# Patient Record
Sex: Male | Born: 1970 | Race: White | Hispanic: No | Marital: Married
Health system: Southern US, Community
[De-identification: ages and names within clinical notes are randomized; demographics above are authoritative.]

---

## 2020-01-12 ENCOUNTER — Other Ambulatory Visit: Payer: Self-pay | Admitting: Nurse Practitioner

## 2020-01-12 DIAGNOSIS — M7989 Other specified soft tissue disorders: Secondary | ICD-10-CM

## 2020-01-18 ENCOUNTER — Ambulatory Visit
Admission: RE | Admit: 2020-01-18 | Discharge: 2020-01-18 | Disposition: A | Discharge status: Home / Self Care | Payer: BC Managed Care – PPO | Source: Ambulatory Visit | Attending: Nurse Practitioner | Admitting: Nurse Practitioner

## 2020-01-18 ENCOUNTER — Other Ambulatory Visit: Payer: Self-pay

## 2020-01-18 DIAGNOSIS — M7989 Other specified soft tissue disorders: Secondary | ICD-10-CM | POA: Diagnosis not present

## 2020-11-30 ENCOUNTER — Other Ambulatory Visit: Payer: Self-pay | Admitting: Student

## 2020-11-30 DIAGNOSIS — G629 Polyneuropathy, unspecified: Secondary | ICD-10-CM

## 2020-11-30 DIAGNOSIS — R200 Anesthesia of skin: Secondary | ICD-10-CM

## 2020-12-09 ENCOUNTER — Other Ambulatory Visit: Payer: Self-pay

## 2020-12-09 ENCOUNTER — Ambulatory Visit
Admission: RE | Admit: 2020-12-09 | Discharge: 2020-12-09 | Disposition: A | Discharge status: Home / Self Care | Payer: BC Managed Care – PPO | Source: Ambulatory Visit | Attending: Student | Admitting: Student

## 2020-12-09 DIAGNOSIS — R200 Anesthesia of skin: Secondary | ICD-10-CM | POA: Insufficient documentation

## 2020-12-09 DIAGNOSIS — G629 Polyneuropathy, unspecified: Secondary | ICD-10-CM | POA: Insufficient documentation

## 2020-12-09 IMAGING — MR MR LUMBAR SPINE W/O CM
5 series · 31 of 48 positions shown · non-contrast
Comparison: None.

CLINICAL DATA: Chronic low back and bilateral leg pain for 2-3
years.

EXAM:
MRI LUMBAR SPINE WITHOUT CONTRAST
TECHNIQUE: Multiplanar, multisequence MR imaging of the lumbar spine was
performed. No intravenous contrast was administered.

[Series 5: T2 · sagittal · 4.0mm · 0.88mm/px · 6 of 17 slices shown (1 of 2)]
[im 1/17]
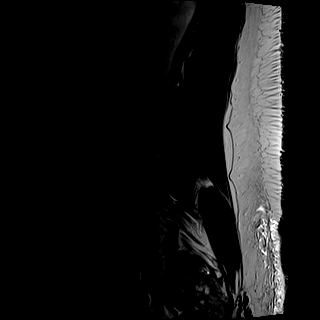
[im 4/17]
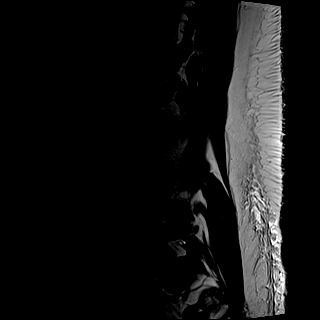
[im 7/17]
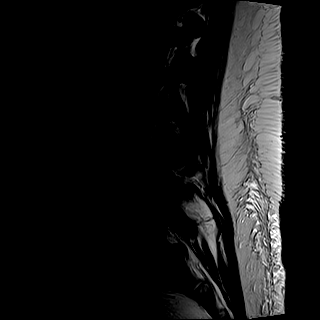
[im 10/17]
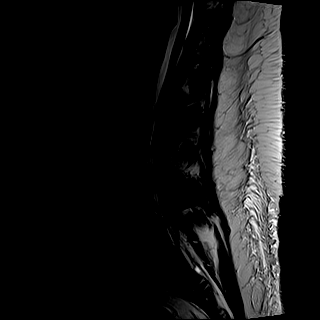
[im 13/17]
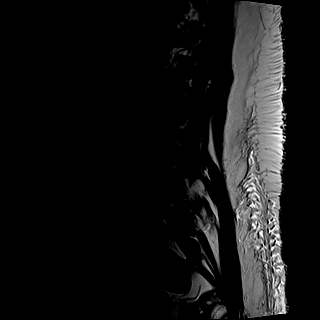
[im 17/17]
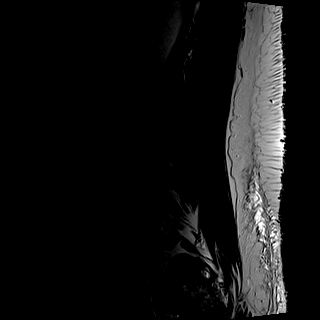

[Series 6: T1 · sagittal · 4.0mm · 0.88mm/px · 6 of 17 slices shown (1 of 2)]
[im 1/17]
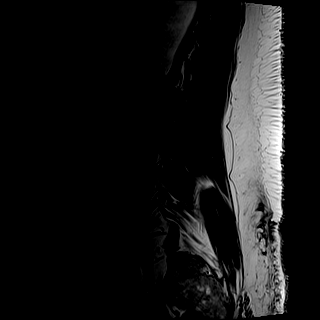
[im 4/17]
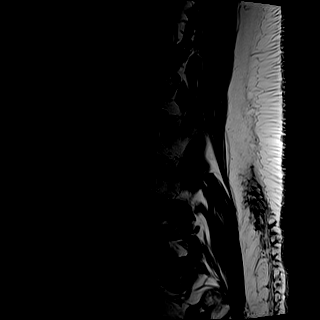
[im 7/17]
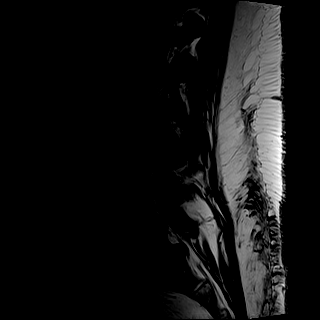
[im 10/17]
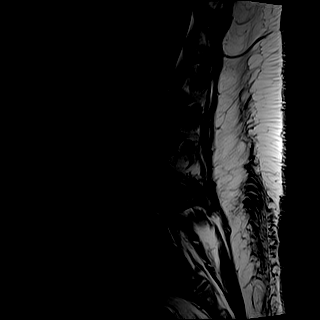
[im 13/17]
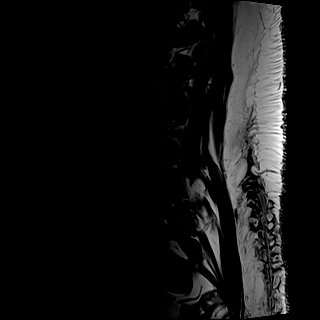
[im 17/17]
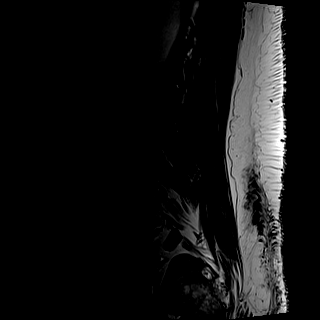

[Series 7: STIR · sagittal · 4.0mm · 0.44mm/px · 1 of 17 slices shown]
[im 1/17]
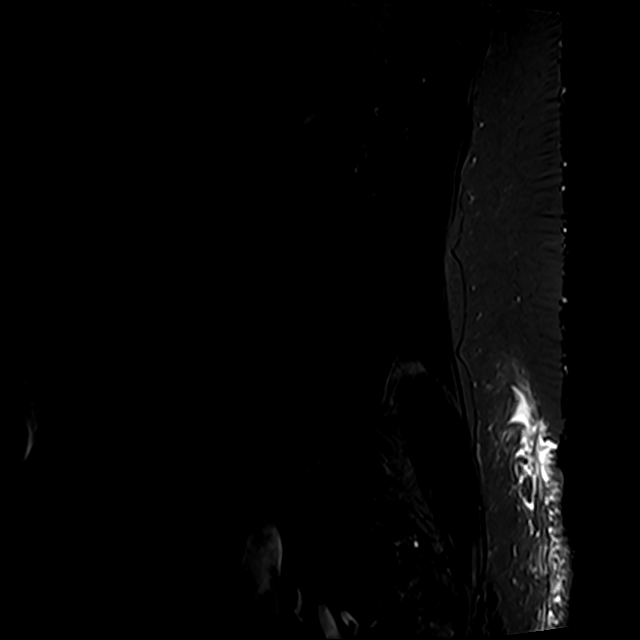

[Series 8: T2 · axial · 4.0mm · 0.78mm/px · z∈[-189,+81]mm · 9 of 42 slices shown (2 of 2)]
[im 1/42]
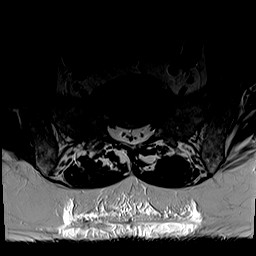
[im 6/42]
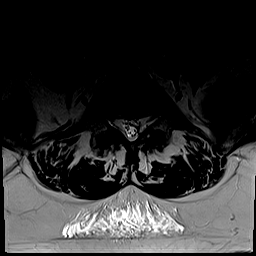
[im 12/42]
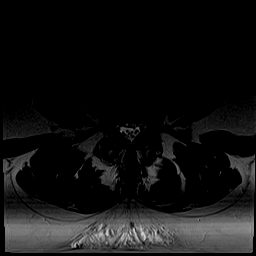
[im 18/42]
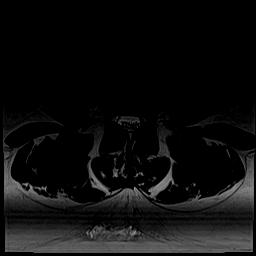
[im 21/42]
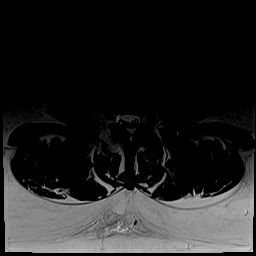
[im 24/42]
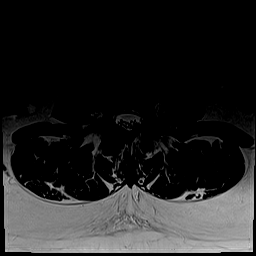
[im 30/42]
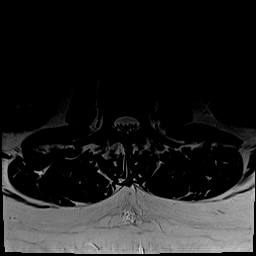
[im 36/42]
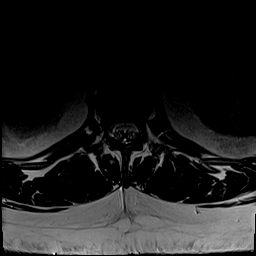
[im 42/42]
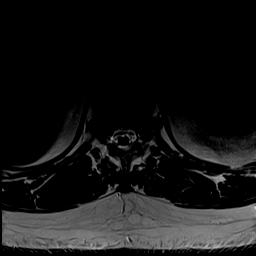

[Series 9: T1 · axial · 4.0mm · 0.39mm/px · z∈[-189,+81]mm · 9 of 42 slices shown (2 of 2)]
[im 1/42]
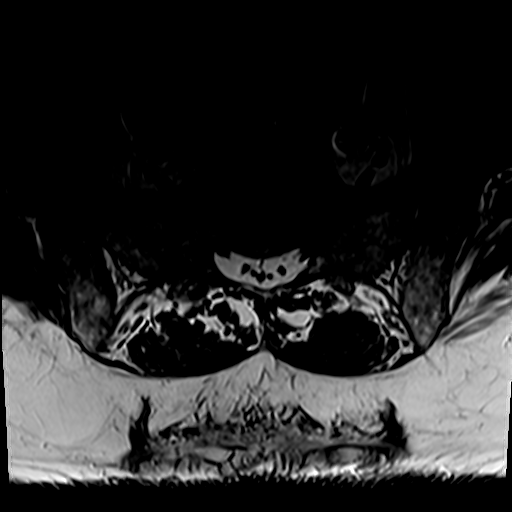
[im 6/42]
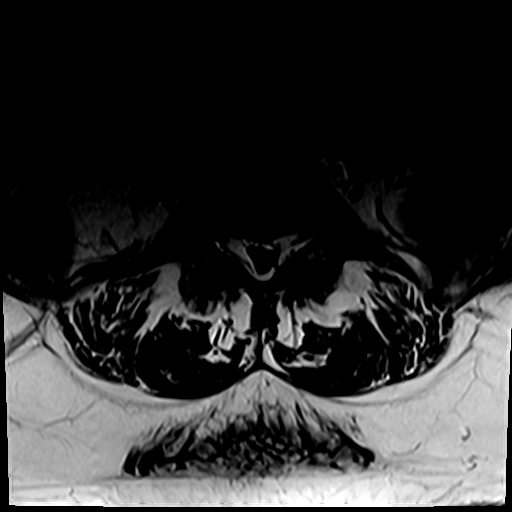
[im 12/42]
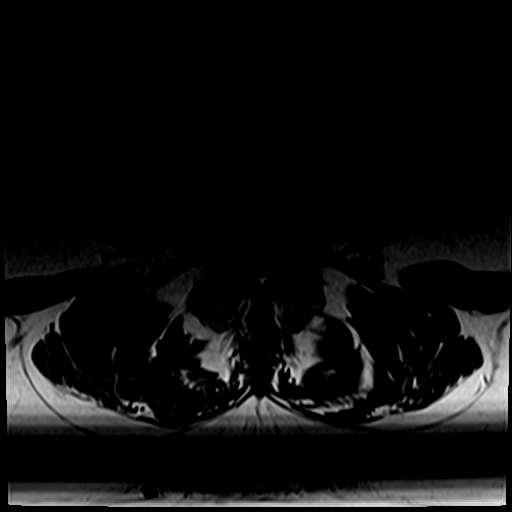
[im 18/42]
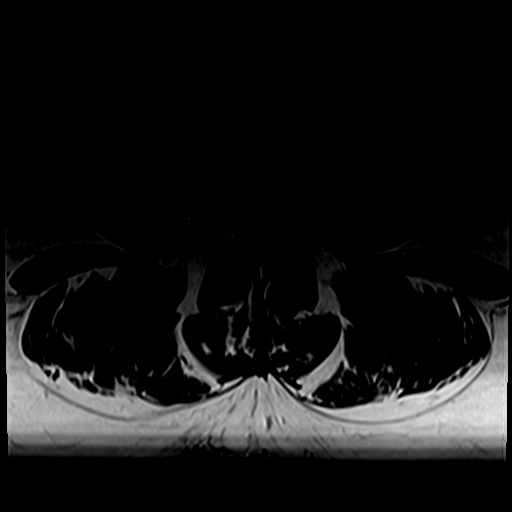
[im 21/42]
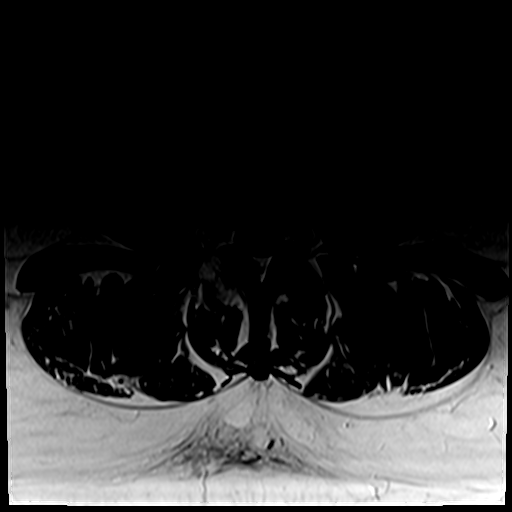
[im 24/42]
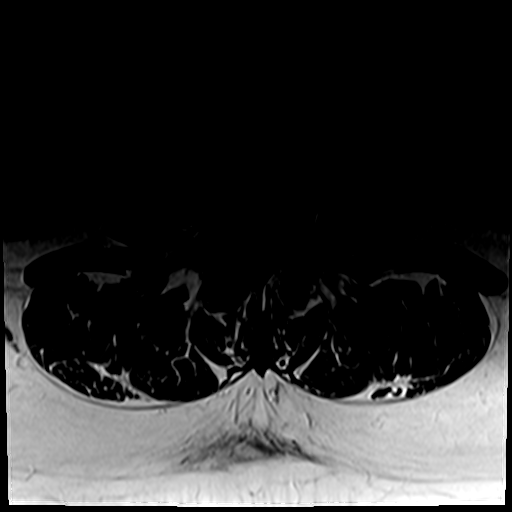
[im 30/42]
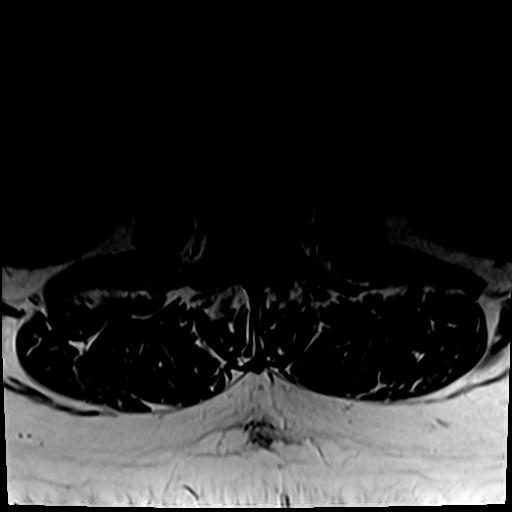
[im 36/42]
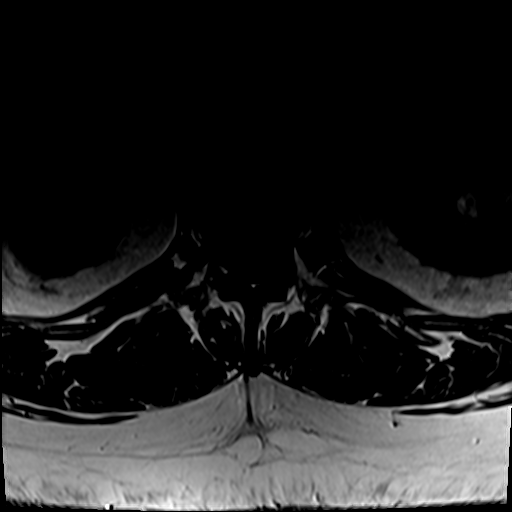
[im 42/42]
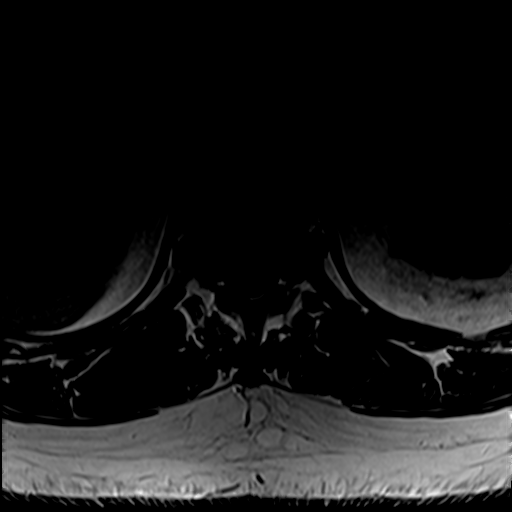

[31 of 48 positions shown; findings below may reference images not displayed]

FINDINGS: Segmentation: There are five lumbar type vertebral bodies. The last
full intervertebral disc space is labeled L5-S1.

Alignment: Overall normal alignment. There is mild degenerative
anterolisthesis of L4.

Vertebrae: Marrow demonstrates slightly heterogeneous low T1 signal
intensity. I do not see any abnormal T2 or STIR signal intensity.
Findings could be due to smoking, anemia or obesity. No focal
lesions or fractures.

Conus medullaris and cauda equina: Conus extends to the T12-L1
level. Conus and cauda equina appear normal.

Paraspinal and other soft tissues: No significant paraspinal or
retroperitoneal findings. Simple appearing renal cysts.

Disc levels:

T12-L1: No significant findings.

L1-2: Mild annular bulge and mild facet disease but no disc
protrusions, spinal or foraminal stenosis.

L2-3: Moderate facet disease but no spinal or foraminal stenosis.

L3-4: Diffuse bulging degenerated and uncovered annulus, short
pedicles, advanced facet disease and mild ligamentum flavum
thickening all contributing to moderately severe spinal and
bilateral lateral recess stenosis. No foraminal stenosis.

L4-5: Diffuse bulging uncovered disc, short pedicles, severe facet
disease and mild ligamentum flavum thickening contributing to
moderately severe spinal and bilateral lateral recess stenosis. No
significant foraminal stenosis.

L5-S1: Left paracentral disc osteophyte complex with mass effect on
the left side of the thecal sac and on the left S1 nerve root. The
exiting L5 nerve roots are unremarkable.
IMPRESSION: 1. Moderately severe multifactorial spinal and bilateral lateral
recess stenosis at L3-4 and L4-5.
2. Left paracentral disc osteophyte complex at L5-S1 with mass
effect on the left side of the thecal sac and on the left S1 nerve
root.

## 2021-06-20 ENCOUNTER — Encounter: Payer: Self-pay | Admitting: Emergency Medicine

## 2021-06-20 ENCOUNTER — Inpatient Hospital Stay
Admission: EM | Admit: 2021-06-20 | Discharge: 2021-08-05 | DRG: 640 | Disposition: E | Payer: BC Managed Care – PPO | Attending: Pulmonary Disease | Admitting: Pulmonary Disease

## 2021-06-20 ENCOUNTER — Other Ambulatory Visit: Payer: Self-pay

## 2021-06-20 ENCOUNTER — Emergency Department: Payer: BC Managed Care – PPO

## 2021-06-20 DIAGNOSIS — D6959 Other secondary thrombocytopenia: Secondary | ICD-10-CM | POA: Diagnosis present

## 2021-06-20 DIAGNOSIS — I2699 Other pulmonary embolism without acute cor pulmonale: Secondary | ICD-10-CM | POA: Diagnosis not present

## 2021-06-20 DIAGNOSIS — Z882 Allergy status to sulfonamides status: Secondary | ICD-10-CM

## 2021-06-20 DIAGNOSIS — J9601 Acute respiratory failure with hypoxia: Secondary | ICD-10-CM | POA: Diagnosis not present

## 2021-06-20 DIAGNOSIS — R7989 Other specified abnormal findings of blood chemistry: Secondary | ICD-10-CM

## 2021-06-20 DIAGNOSIS — J9602 Acute respiratory failure with hypercapnia: Secondary | ICD-10-CM | POA: Diagnosis not present

## 2021-06-20 DIAGNOSIS — N179 Acute kidney failure, unspecified: Secondary | ICD-10-CM | POA: Diagnosis present

## 2021-06-20 DIAGNOSIS — R531 Weakness: Principal | ICD-10-CM

## 2021-06-20 DIAGNOSIS — R34 Anuria and oliguria: Secondary | ICD-10-CM | POA: Diagnosis not present

## 2021-06-20 DIAGNOSIS — Z6841 Body Mass Index (BMI) 40.0 and over, adult: Secondary | ICD-10-CM

## 2021-06-20 DIAGNOSIS — A419 Sepsis, unspecified organism: Secondary | ICD-10-CM

## 2021-06-20 DIAGNOSIS — G7281 Critical illness myopathy: Secondary | ICD-10-CM | POA: Diagnosis not present

## 2021-06-20 DIAGNOSIS — G6281 Critical illness polyneuropathy: Secondary | ICD-10-CM | POA: Diagnosis not present

## 2021-06-20 DIAGNOSIS — R509 Fever, unspecified: Secondary | ICD-10-CM | POA: Diagnosis not present

## 2021-06-20 DIAGNOSIS — K760 Fatty (change of) liver, not elsewhere classified: Secondary | ICD-10-CM | POA: Diagnosis present

## 2021-06-20 DIAGNOSIS — Z20822 Contact with and (suspected) exposure to covid-19: Secondary | ICD-10-CM | POA: Diagnosis present

## 2021-06-20 DIAGNOSIS — Z66 Do not resuscitate: Secondary | ICD-10-CM | POA: Diagnosis not present

## 2021-06-20 DIAGNOSIS — F10939 Alcohol use, unspecified with withdrawal, unspecified: Secondary | ICD-10-CM | POA: Diagnosis present

## 2021-06-20 DIAGNOSIS — M6284 Sarcopenia: Secondary | ICD-10-CM | POA: Diagnosis not present

## 2021-06-20 DIAGNOSIS — G9341 Metabolic encephalopathy: Secondary | ICD-10-CM | POA: Diagnosis present

## 2021-06-20 DIAGNOSIS — F10931 Alcohol use, unspecified with withdrawal delirium: Secondary | ICD-10-CM | POA: Diagnosis not present

## 2021-06-20 DIAGNOSIS — F10231 Alcohol dependence with withdrawal delirium: Secondary | ICD-10-CM | POA: Diagnosis present

## 2021-06-20 DIAGNOSIS — R6521 Severe sepsis with septic shock: Secondary | ICD-10-CM | POA: Diagnosis not present

## 2021-06-20 DIAGNOSIS — N170 Acute kidney failure with tubular necrosis: Secondary | ICD-10-CM | POA: Diagnosis present

## 2021-06-20 DIAGNOSIS — Y900 Blood alcohol level of less than 20 mg/100 ml: Secondary | ICD-10-CM | POA: Diagnosis present

## 2021-06-20 DIAGNOSIS — J690 Pneumonitis due to inhalation of food and vomit: Secondary | ICD-10-CM | POA: Diagnosis not present

## 2021-06-20 DIAGNOSIS — I248 Other forms of acute ischemic heart disease: Secondary | ICD-10-CM | POA: Diagnosis not present

## 2021-06-20 DIAGNOSIS — G8929 Other chronic pain: Secondary | ICD-10-CM | POA: Diagnosis present

## 2021-06-20 DIAGNOSIS — N186 End stage renal disease: Secondary | ICD-10-CM

## 2021-06-20 DIAGNOSIS — D696 Thrombocytopenia, unspecified: Secondary | ICD-10-CM

## 2021-06-20 DIAGNOSIS — J320 Chronic maxillary sinusitis: Secondary | ICD-10-CM | POA: Diagnosis present

## 2021-06-20 DIAGNOSIS — E46 Unspecified protein-calorie malnutrition: Secondary | ICD-10-CM | POA: Diagnosis not present

## 2021-06-20 DIAGNOSIS — G629 Polyneuropathy, unspecified: Secondary | ICD-10-CM | POA: Diagnosis present

## 2021-06-20 DIAGNOSIS — E871 Hypo-osmolality and hyponatremia: Principal | ICD-10-CM | POA: Diagnosis present

## 2021-06-20 DIAGNOSIS — R7401 Elevation of levels of liver transaminase levels: Secondary | ICD-10-CM | POA: Diagnosis present

## 2021-06-20 DIAGNOSIS — E860 Dehydration: Secondary | ICD-10-CM | POA: Diagnosis present

## 2021-06-20 DIAGNOSIS — Z978 Presence of other specified devices: Secondary | ICD-10-CM

## 2021-06-20 DIAGNOSIS — R042 Hemoptysis: Secondary | ICD-10-CM | POA: Diagnosis not present

## 2021-06-20 DIAGNOSIS — Z7189 Other specified counseling: Secondary | ICD-10-CM | POA: Diagnosis not present

## 2021-06-20 DIAGNOSIS — K709 Alcoholic liver disease, unspecified: Secondary | ICD-10-CM | POA: Diagnosis present

## 2021-06-20 DIAGNOSIS — Z515 Encounter for palliative care: Secondary | ICD-10-CM | POA: Diagnosis not present

## 2021-06-20 DIAGNOSIS — M549 Dorsalgia, unspecified: Secondary | ICD-10-CM | POA: Diagnosis present

## 2021-06-20 DIAGNOSIS — T508X5A Adverse effect of diagnostic agents, initial encounter: Secondary | ICD-10-CM | POA: Diagnosis present

## 2021-06-20 DIAGNOSIS — K7200 Acute and subacute hepatic failure without coma: Secondary | ICD-10-CM | POA: Diagnosis not present

## 2021-06-20 LAB — URINALYSIS, ROUTINE W REFLEX MICROSCOPIC
Glucose, UA: NEGATIVE mg/dL
Ketones, ur: 5 mg/dL — AB
Leukocytes,Ua: NEGATIVE
Nitrite: NEGATIVE
Protein, ur: 30 mg/dL — AB
Specific Gravity, Urine: 1.043 — ABNORMAL HIGH (ref 1.005–1.030)
pH: 5 (ref 5.0–8.0)

## 2021-06-20 LAB — CBC WITH DIFFERENTIAL/PLATELET
Abs Immature Granulocytes: 0.17 10*3/uL — ABNORMAL HIGH (ref 0.00–0.07)
Basophils Absolute: 0.1 10*3/uL (ref 0.0–0.1)
Basophils Relative: 1 %
Eosinophils Absolute: 0 10*3/uL (ref 0.0–0.5)
Eosinophils Relative: 0 %
HCT: 41.7 % (ref 39.0–52.0)
Hemoglobin: 15.3 g/dL (ref 13.0–17.0)
Immature Granulocytes: 2 %
Lymphocytes Relative: 8 %
Lymphs Abs: 0.8 10*3/uL (ref 0.7–4.0)
MCH: 37.5 pg — ABNORMAL HIGH (ref 26.0–34.0)
MCHC: 36.7 g/dL — ABNORMAL HIGH (ref 30.0–36.0)
MCV: 102.2 fL — ABNORMAL HIGH (ref 80.0–100.0)
Monocytes Absolute: 1.8 10*3/uL — ABNORMAL HIGH (ref 0.1–1.0)
Monocytes Relative: 16 %
Neutro Abs: 8.1 10*3/uL — ABNORMAL HIGH (ref 1.7–7.7)
Neutrophils Relative %: 73 %
Platelets: 129 10*3/uL — ABNORMAL LOW (ref 150–400)
RBC: 4.08 MIL/uL — ABNORMAL LOW (ref 4.22–5.81)
RDW: 13.2 % (ref 11.5–15.5)
WBC: 11 10*3/uL — ABNORMAL HIGH (ref 4.0–10.5)
nRBC: 0.7 % — ABNORMAL HIGH (ref 0.0–0.2)

## 2021-06-20 LAB — COMPREHENSIVE METABOLIC PANEL
ALT: 119 U/L — ABNORMAL HIGH (ref 0–44)
AST: 256 U/L — ABNORMAL HIGH (ref 15–41)
Albumin: 3.1 g/dL — ABNORMAL LOW (ref 3.5–5.0)
Alkaline Phosphatase: 116 U/L (ref 38–126)
Anion gap: 17 — ABNORMAL HIGH (ref 5–15)
BUN: 8 mg/dL (ref 6–20)
CO2: 23 mmol/L (ref 22–32)
Calcium: 8.5 mg/dL — ABNORMAL LOW (ref 8.9–10.3)
Chloride: 69 mmol/L — ABNORMAL LOW (ref 98–111)
Creatinine, Ser: 1.62 mg/dL — ABNORMAL HIGH (ref 0.61–1.24)
GFR, Estimated: 52 mL/min — ABNORMAL LOW (ref >60)
Glucose, Bld: 108 mg/dL — ABNORMAL HIGH (ref 70–99)
Potassium: 4.5 mmol/L (ref 3.5–5.1)
Sodium: 109 mmol/L — CL (ref 135–145)
Total Bilirubin: 5 mg/dL — ABNORMAL HIGH (ref 0.3–1.2)
Total Protein: 6.8 g/dL (ref 6.5–8.1)

## 2021-06-20 LAB — HIV ANTIBODY (ROUTINE TESTING W REFLEX): HIV Screen 4th Generation wRfx: NONREACTIVE

## 2021-06-20 LAB — OSMOLALITY
Osmolality: 236 mOsm/kg — CL (ref 275–295)
Osmolality: 240 mOsm/kg — CL (ref 275–295)

## 2021-06-20 LAB — URINE DRUG SCREEN, QUALITATIVE (ARMC ONLY)
Amphetamines, Ur Screen: NOT DETECTED
Barbiturates, Ur Screen: NOT DETECTED
Benzodiazepine, Ur Scrn: NOT DETECTED
Cannabinoid 50 Ng, Ur ~~LOC~~: NOT DETECTED
Cocaine Metabolite,Ur ~~LOC~~: NOT DETECTED
MDMA (Ecstasy)Ur Screen: NOT DETECTED
Methadone Scn, Ur: NOT DETECTED
Opiate, Ur Screen: NOT DETECTED
Phencyclidine (PCP) Ur S: NOT DETECTED
Tricyclic, Ur Screen: POSITIVE — AB

## 2021-06-20 LAB — RESP PANEL BY RT-PCR (FLU A&B, COVID) ARPGX2
Influenza A by PCR: NEGATIVE
Influenza B by PCR: NEGATIVE
SARS Coronavirus 2 by RT PCR: NEGATIVE

## 2021-06-20 LAB — BLOOD GAS, VENOUS
Acid-Base Excess: 0.8 mmol/L (ref 0.0–2.0)
Bicarbonate: 24.2 mmol/L (ref 20.0–28.0)
O2 Saturation: 85.1 %
Patient temperature: 37
pCO2, Ven: 34 mmHg — ABNORMAL LOW (ref 44–60)
pH, Ven: 7.46 — ABNORMAL HIGH (ref 7.25–7.43)
pO2, Ven: 50 mmHg — ABNORMAL HIGH (ref 32–45)

## 2021-06-20 LAB — SODIUM
Sodium: 109 mmol/L — CL (ref 135–145)
Sodium: 108 mmol/L — CL (ref 135–145)
Sodium: 108 mmol/L — CL (ref 135–145)
Sodium: 111 mmol/L — CL (ref 135–145)

## 2021-06-20 LAB — LIPASE, BLOOD: Lipase: 86 U/L — ABNORMAL HIGH (ref 11–51)

## 2021-06-20 LAB — TSH: TSH: 2.119 u[IU]/mL (ref 0.350–4.500)

## 2021-06-20 LAB — ETHANOL: Alcohol, Ethyl (B): <10 mg/dL (ref <10)

## 2021-06-20 LAB — MRSA NEXT GEN BY PCR, NASAL: MRSA by PCR Next Gen: NOT DETECTED

## 2021-06-20 LAB — SODIUM, URINE, RANDOM: Sodium, Ur: <10 mmol/L

## 2021-06-20 LAB — TROPONIN I (HIGH SENSITIVITY)
Troponin I (High Sensitivity): 67 ng/L — ABNORMAL HIGH (ref <18)
Troponin I (High Sensitivity): 65 ng/L — ABNORMAL HIGH (ref <18)

## 2021-06-20 LAB — MAGNESIUM: Magnesium: 1.7 mg/dL (ref 1.7–2.4)

## 2021-06-20 LAB — OSMOLALITY, URINE: Osmolality, Ur: 437 mOsm/kg (ref 300–900)

## 2021-06-20 LAB — GLUCOSE, CAPILLARY: Glucose-Capillary: 99 mg/dL (ref 70–99)

## 2021-06-20 IMAGING — CT CT ABD-PELV W/ CM
2 of 5 series · 16 of 46 positions shown, 18 images · IV contrast (APPLIED)
Comparison: None Available.

CLINICAL DATA: Acute generalized abdominal pain.

EXAM:
CT ABDOMEN AND PELVIS WITH CONTRAST
TECHNIQUE: Multidetector CT imaging of the abdomen and pelvis was performed
using the standard protocol following bolus administration of
intravenous contrast.

[Series 2: abdomen 5.0 · axial · 0.88mm/px · z∈[-975,-460]mm · 13 of 121 slices shown, 15 images]
[im 9/121  soft-tissue]
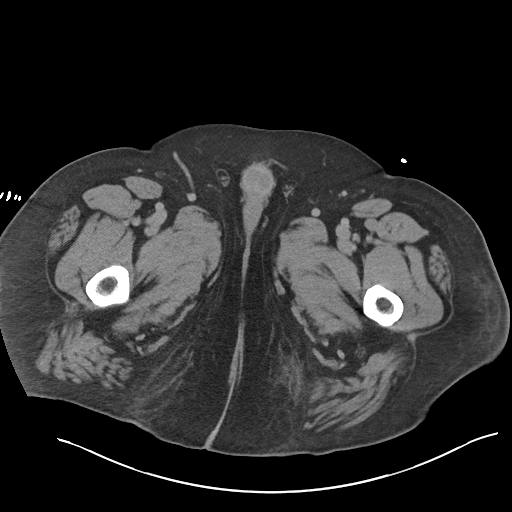
[im 9/121  bone]
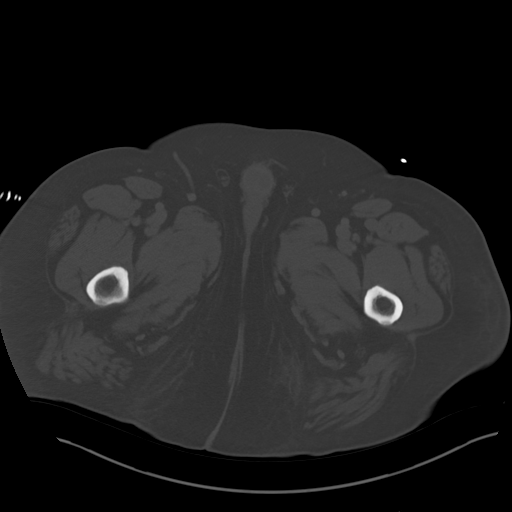
[im 18/121  soft-tissue]
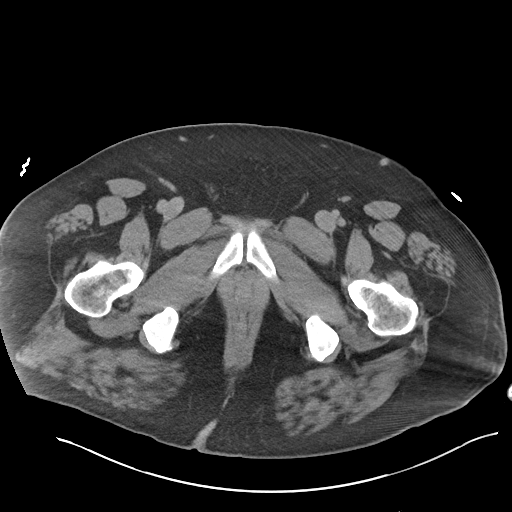
[im 26/121  soft-tissue]
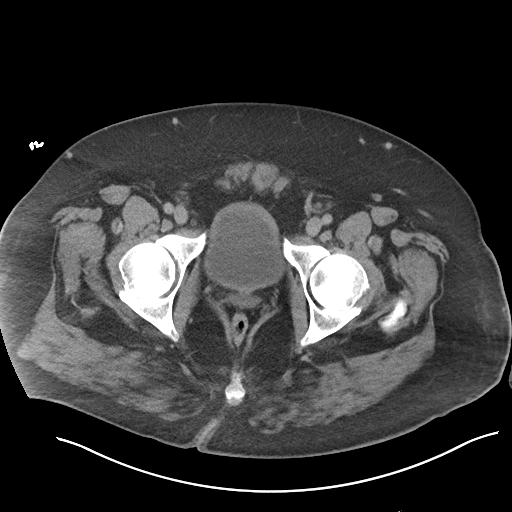
[im 35/121  soft-tissue]
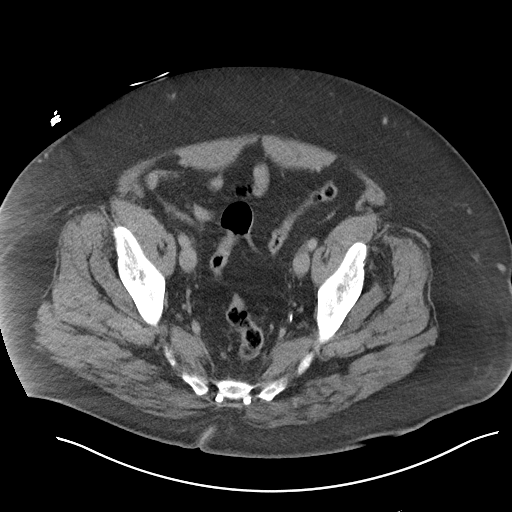
[im 43/121  soft-tissue]
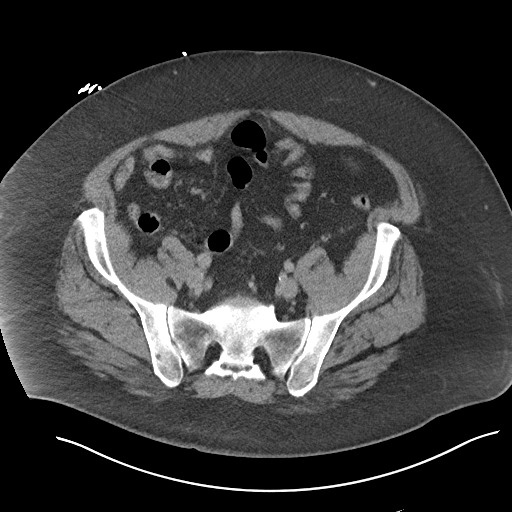
[im 52/121  soft-tissue]
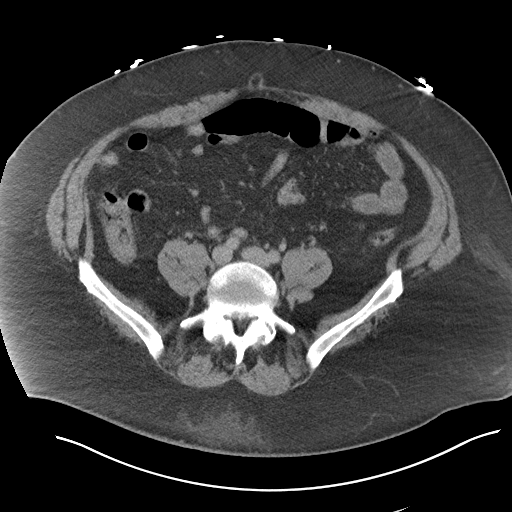
[im 61/121  soft-tissue]
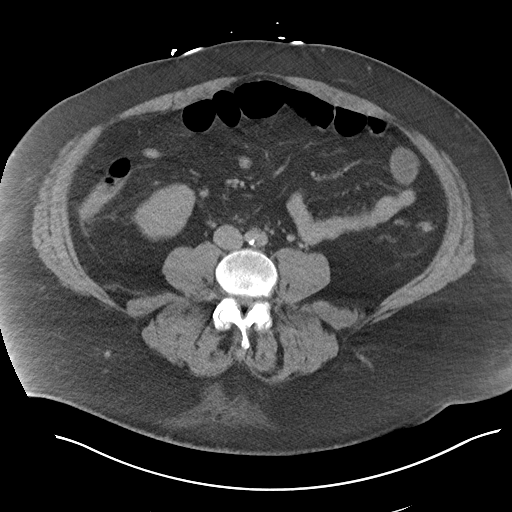
[im 69/121  soft-tissue]
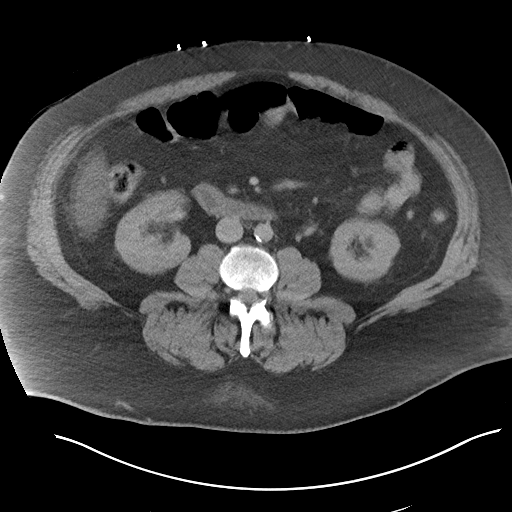
[im 78/121  soft-tissue]
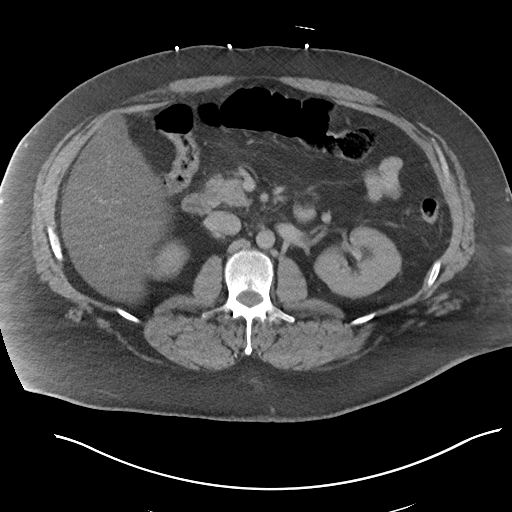
[im 78/121  bone]
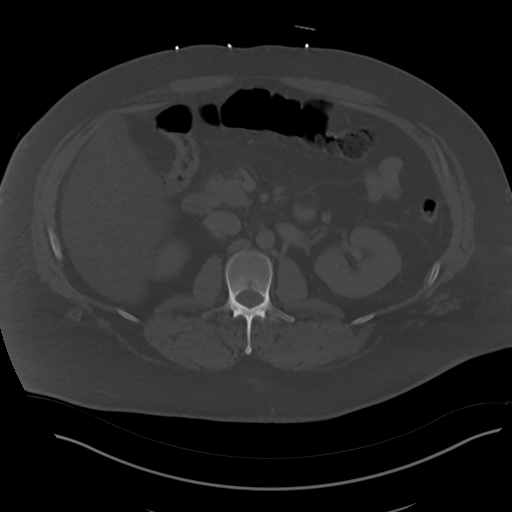
[im 86/121  soft-tissue]
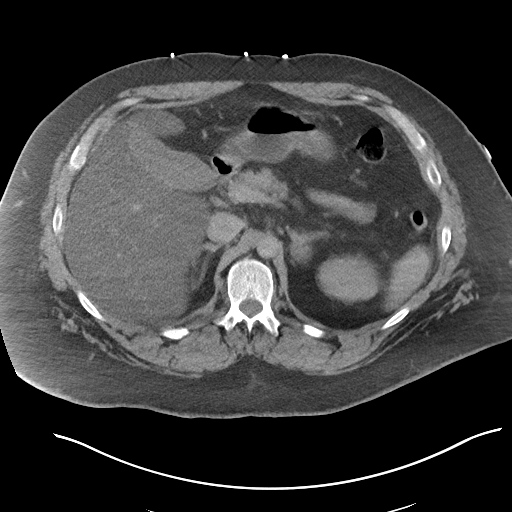
[im 95/121  soft-tissue]
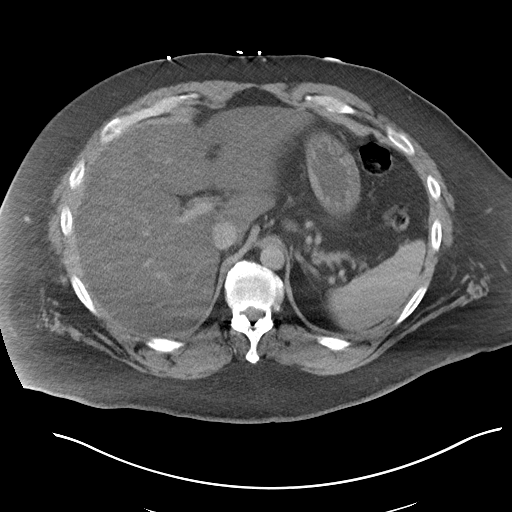
[im 103/121  soft-tissue]
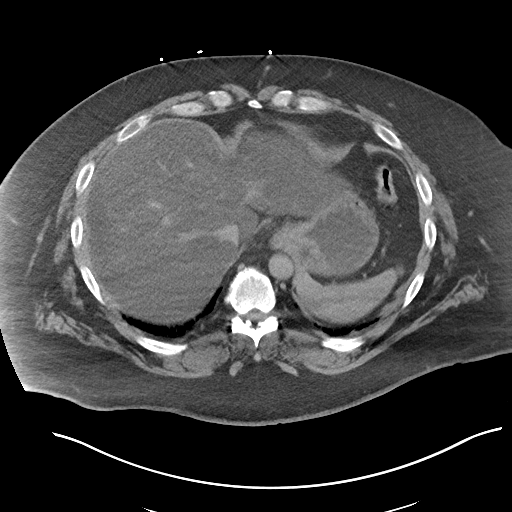
[im 112/121  soft-tissue]
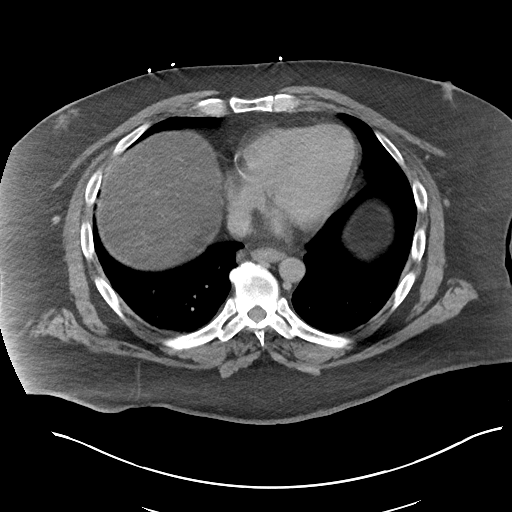

[Series 4: abdomen 3.0 mpr cor · coronal · 0.98mm/px · 3 of 126 slices shown]
[im 42/126  soft-tissue]
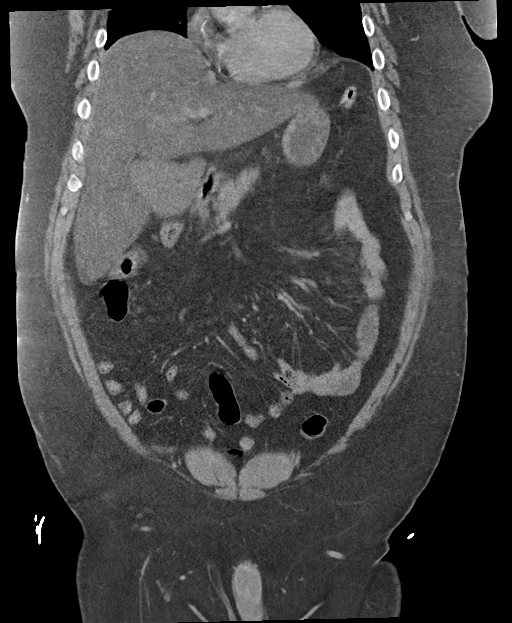
[im 56/126  soft-tissue]
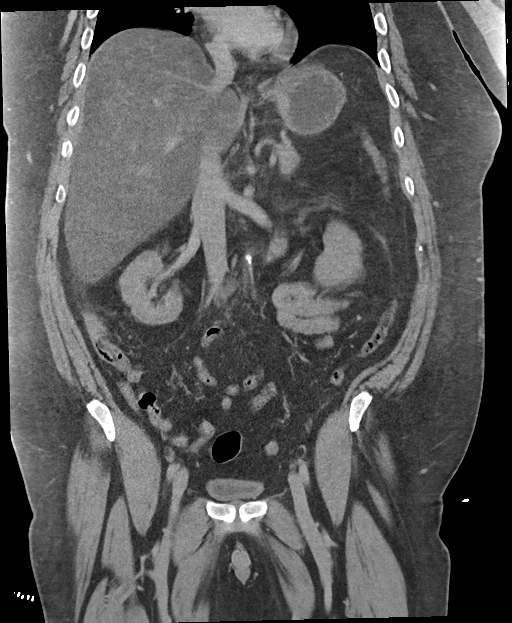
[im 70/126  soft-tissue]
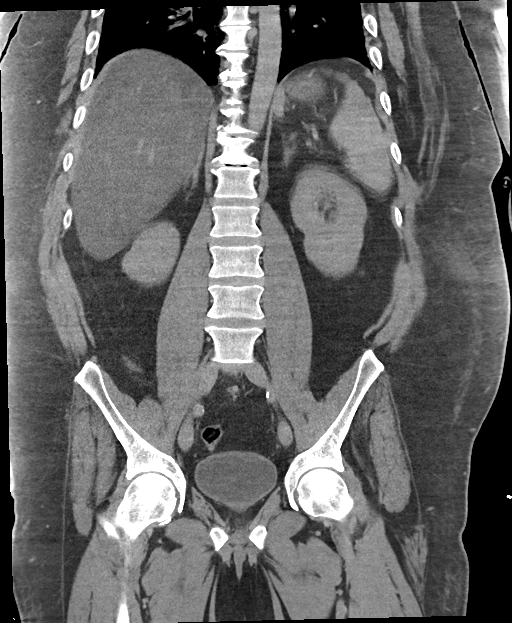

[16 of 46 positions shown; findings below may reference images not displayed]

RADIATION DOSE REDUCTION: This exam was performed according to the
departmental dose-optimization program which includes automated
exposure control, adjustment of the mA and/or kV according to
patient size and/or use of iterative reconstruction technique.

CONTRAST:  100mL OMNIPAQUE IOHEXOL 300 MG/ML  SOLN
FINDINGS: Lower chest: No acute abnormality.

Hepatobiliary: No gallstones or biliary dilatation is noted. Hepatic
steatosis is noted.

Pancreas: Unremarkable. No pancreatic ductal dilatation or
surrounding inflammatory changes.

Spleen: Normal in size without focal abnormality.

Adrenals/Urinary Tract: 2.6 cm enhancing left adrenal nodule is
noted. Right adrenal gland appears normal. No hydronephrosis or
renal obstruction is noted. No renal or ureteral calculi are noted.
Urinary bladder is unremarkable.

Stomach/Bowel: Stomach is within normal limits. Appendix appears
normal. No evidence of bowel wall thickening, distention, or
inflammatory changes.

Vascular/Lymphatic: Aortic atherosclerosis. No enlarged abdominal or
pelvic lymph nodes.

Reproductive: Prostate is unremarkable.

Other: No abdominal wall hernia or abnormality. No abdominopelvic
ascites.

Musculoskeletal: No acute or significant osseous findings.
IMPRESSION: Hepatic steatosis.

2.6 cm enhancing left adrenal nodule is noted. When the patient is
clinically stable and able to follow directions and hold their
breath (preferably as an outpatient) further evaluation with
dedicated abdominal MRI should be considered.

Aortic Atherosclerosis ([SJ]-[SJ]).

## 2021-06-20 IMAGING — CT CT HEAD W/O CM
4 series · 16 of 47 positions shown, 18 images · non-contrast
Comparison: None Available.

CLINICAL DATA: Altered mental status. Weakness for 2 weeks. Urinary
incontinence. Confusion.



[Series 2: head bone · axial · 0.41mm/px · z∈[-142,-110]mm · 3 of 82 slices shown]
[im 9/82  bone]
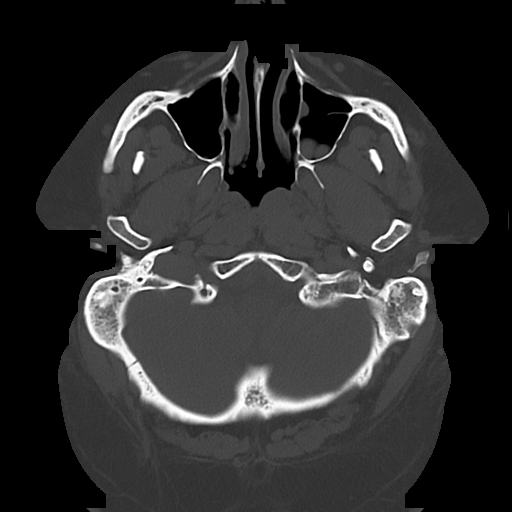
[im 17/82  bone]
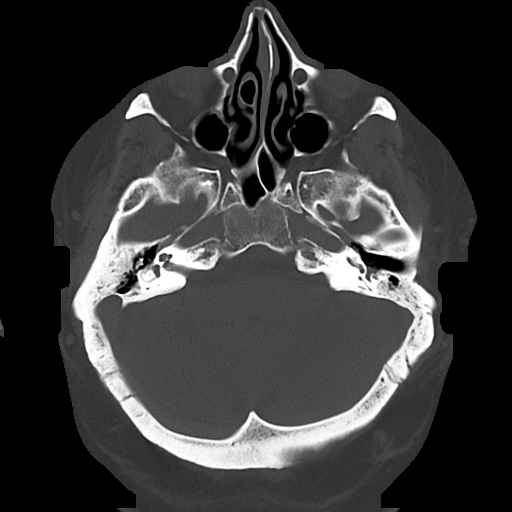
[im 25/82  bone]
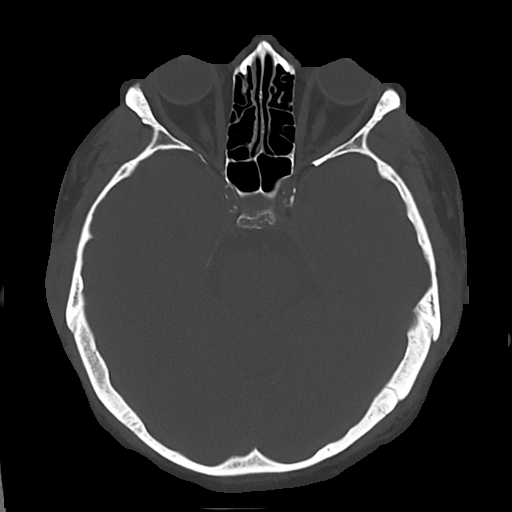

[Series 3: head wo · axial · 0.41mm/px · z∈[-138,-18]mm · 7 of 33 slices shown, 9 images]
[im 5/33  brain]
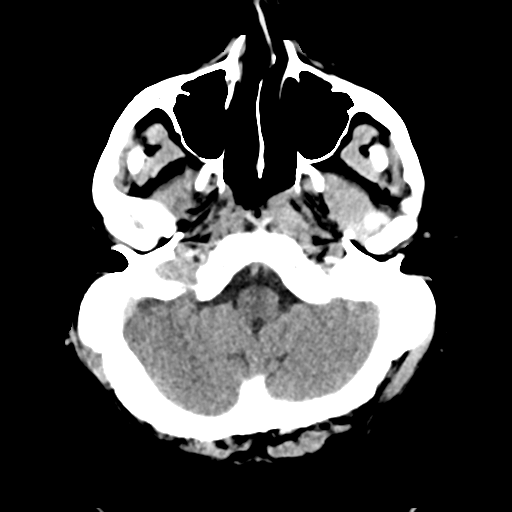
[im 5/33  bone]
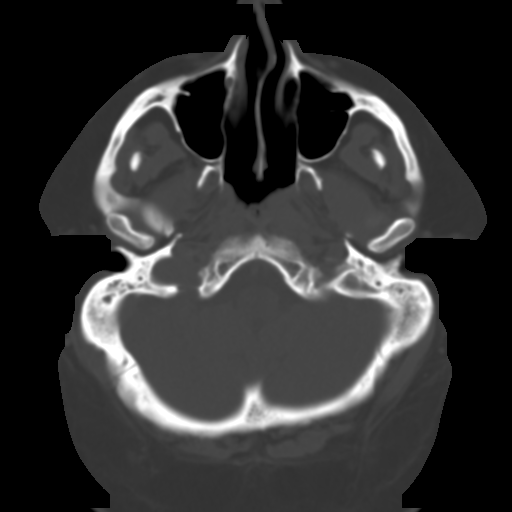
[im 9/33  brain]
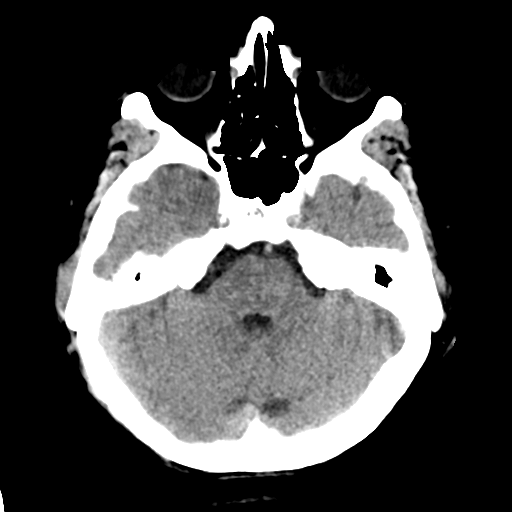
[im 13/33  brain]
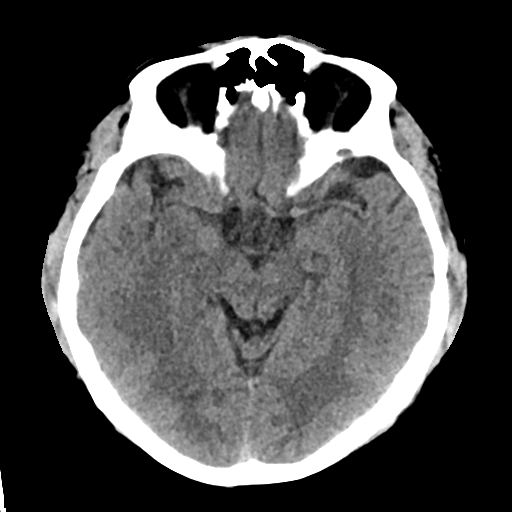
[im 17/33  brain]
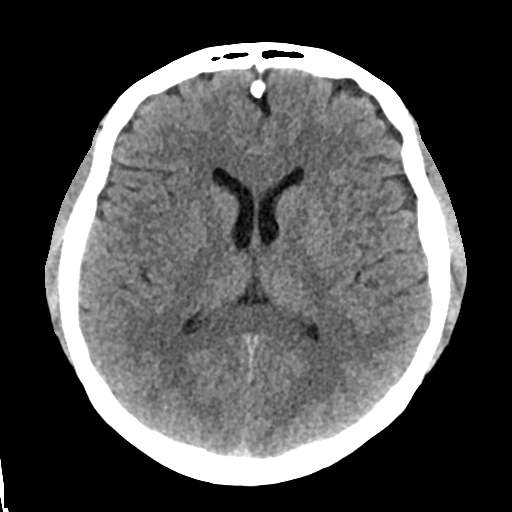
[im 21/33  brain]
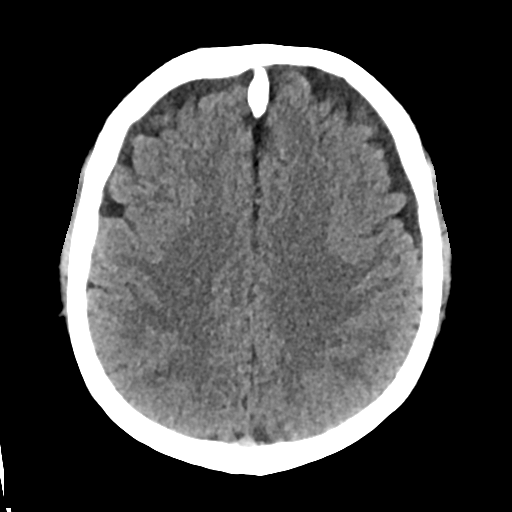
[im 21/33  bone]
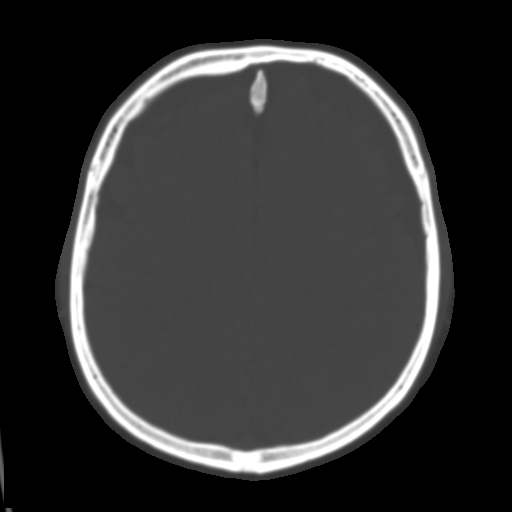
[im 25/33  brain]
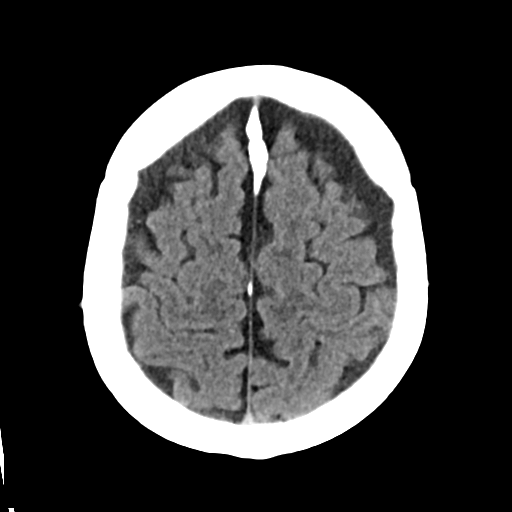
[im 29/33  brain]
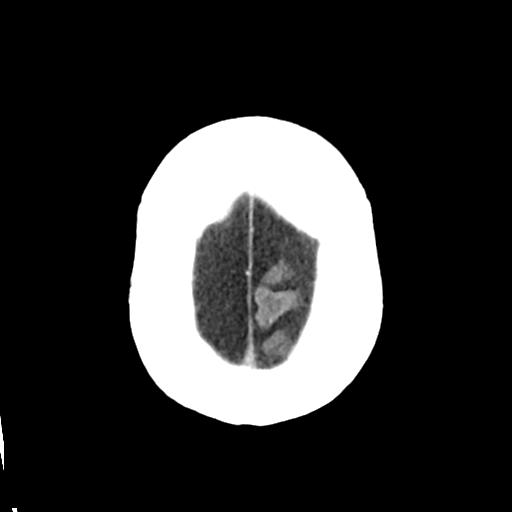

[Series 4: cor soft · coronal · 0.33mm/px · 3 of 71 slices shown]
[im 24/71  brain]
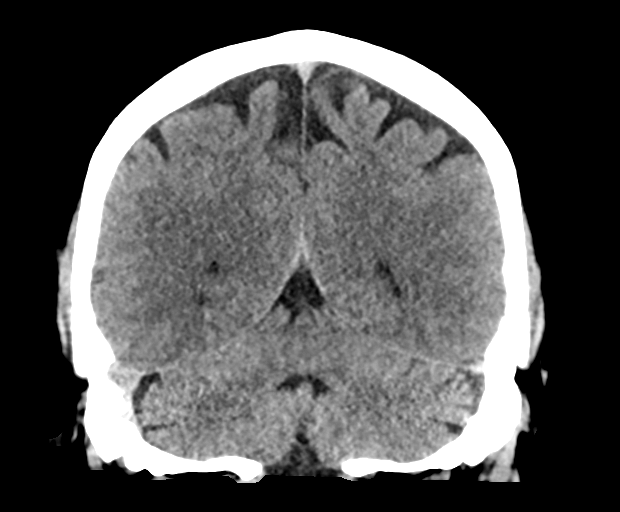
[im 32/71  brain]
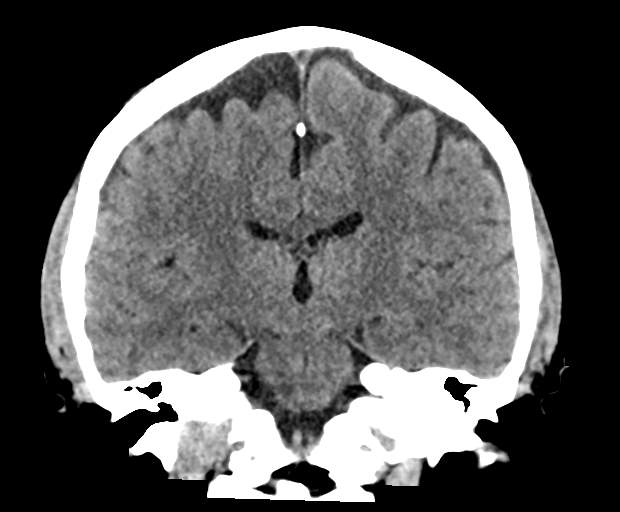
[im 39/71  brain]
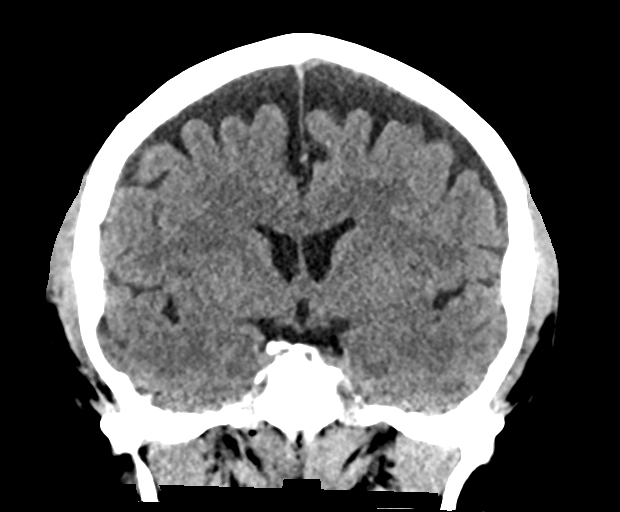

[Series 5: sag soft · sagittal · 0.33mm/px · 3 of 69 slices shown]
[im 23/69  brain]
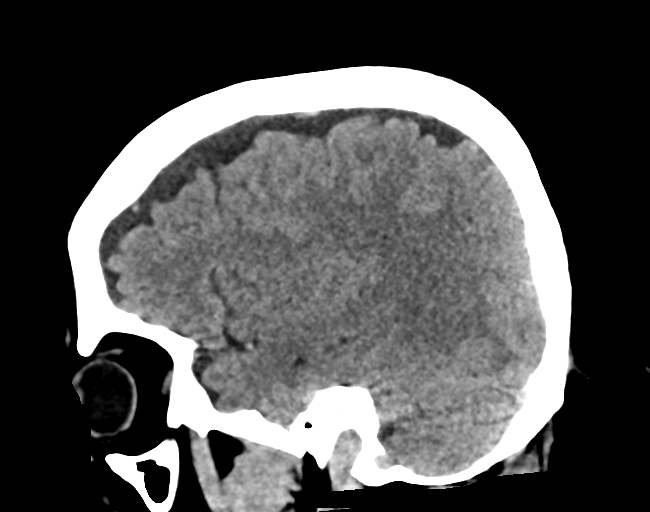
[im 35/69  brain]
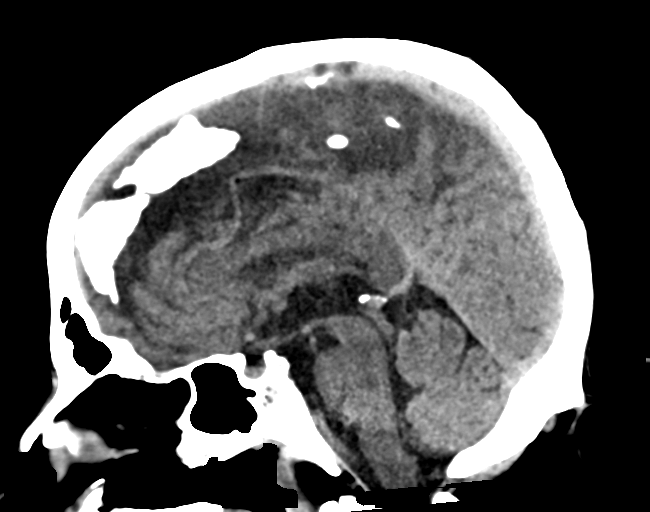
[im 46/69  brain]
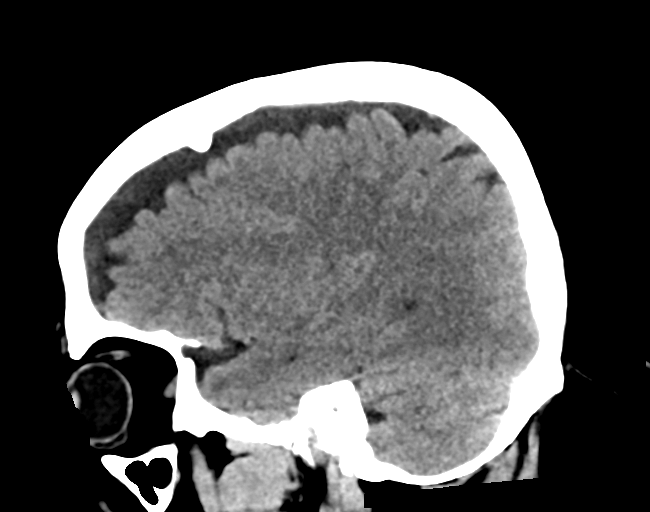

[16 of 47 positions shown; findings below may reference images not displayed]

FINDINGS: Brain: Advanced for age cerebral atrophy especially involving the
frontal lobes. Otherwise, the brainstem, cerebellum, cerebral
peduncles, thalamus, basal ganglia, basilar cisterns, and
ventricular system appear within normal limits. No intracranial
hemorrhage, mass lesion, or acute CVA.

Vascular: There is atherosclerotic calcification of the cavernous
carotid arteries bilaterally.

Skull: Unremarkable

Sinuses/Orbits: Chronic bilateral maxillary sinusitis. Small
bilateral mastoid effusions. Fluid or debris in the left sinus
tympani on images 16-17 of series 2.

Other: No supplemental non-categorized findings.
IMPRESSION: 1. Fluid or debris in the left sinus tympani/middle ear, cannot
exclude otitis media. There small bilateral mastoid effusions and
chronic bilateral maxillary sinusitis.
2. Advanced for age cerebral atrophy.
3. Atherosclerosis.

## 2021-06-20 MED ORDER — THIAMINE HCL 100 MG PO TABS
100.0000 mg | ORAL_TABLET | Freq: Every day | ORAL | Status: DC
  Administered 2021-06-20: 100 mg via ORAL
  Filled 2021-06-20: qty 1

## 2021-06-20 MED ORDER — IOHEXOL 300 MG/ML  SOLN
100.0000 mL | Freq: Once | INTRAMUSCULAR | Status: AC | PRN
  Administered 2021-06-20: 100 mL via INTRAVENOUS

## 2021-06-20 MED ORDER — ONDANSETRON HCL 4 MG PO TABS: 4.0000 mg | ORAL_TABLET | Freq: Four times a day (QID) | ORAL | Status: DC | PRN

## 2021-06-20 MED ORDER — THIAMINE HCL 100 MG/ML IJ SOLN
100.0000 mg | Freq: Every day | INTRAMUSCULAR | Status: DC
  Administered 2021-06-21: 100 mg via INTRAVENOUS
  Filled 2021-06-20 (×3): qty 2

## 2021-06-20 MED ORDER — SODIUM CHLORIDE 0.9 % IV BOLUS
1000.0000 mL | Freq: Once | INTRAVENOUS | Status: AC
  Administered 2021-06-20: 1000 mL via INTRAVENOUS

## 2021-06-20 MED ORDER — SODIUM CHLORIDE 0.9 % IV SOLN: INTRAVENOUS | Status: DC

## 2021-06-20 MED ORDER — LORAZEPAM 2 MG/ML IJ SOLN
0.0000 mg | INTRAMUSCULAR | Status: DC
  Administered 2021-06-20 (×2): 2 mg via INTRAVENOUS
  Administered 2021-06-21 (×5): 1 mg via INTRAVENOUS
  Administered 2021-06-21 – 2021-06-22 (×3): 2 mg via INTRAVENOUS
  Filled 2021-06-20 (×10): qty 1

## 2021-06-20 MED ORDER — CHLORHEXIDINE GLUCONATE CLOTH 2 % EX PADS
6.0000 | MEDICATED_PAD | Freq: Every day | CUTANEOUS | Status: DC
  Administered 2021-06-20 – 2021-07-07 (×17): 6 via TOPICAL

## 2021-06-20 MED ORDER — CLONIDINE HCL 0.1 MG PO TABS
0.1000 mg | ORAL_TABLET | Freq: Two times a day (BID) | ORAL | Status: DC
  Administered 2021-06-20: 0.1 mg via ORAL
  Filled 2021-06-20 (×2): qty 1

## 2021-06-20 MED ORDER — ONDANSETRON HCL 4 MG/2ML IJ SOLN: 4.0000 mg | Freq: Four times a day (QID) | INTRAMUSCULAR | Status: DC | PRN

## 2021-06-20 MED ORDER — LORAZEPAM 2 MG/ML IJ SOLN: 1.0000 mg | INTRAMUSCULAR | Status: DC | PRN

## 2021-06-20 MED ORDER — ENOXAPARIN SODIUM 40 MG/0.4ML IJ SOSY
40.0000 mg | PREFILLED_SYRINGE | INTRAMUSCULAR | Status: DC
  Administered 2021-06-20 – 2021-06-21 (×2): 40 mg via SUBCUTANEOUS
  Filled 2021-06-20 (×2): qty 0.4

## 2021-06-20 MED ORDER — MAGNESIUM SULFATE 2 GM/50ML IV SOLN
2.0000 g | Freq: Once | INTRAVENOUS | Status: AC
  Administered 2021-06-20: 2 g via INTRAVENOUS
  Filled 2021-06-20: qty 50

## 2021-06-20 MED ORDER — LORAZEPAM 1 MG PO TABS: 1.0000 mg | ORAL_TABLET | ORAL | Status: DC | PRN

## 2021-06-20 MED ORDER — SODIUM CHLORIDE 3 % IV SOLN
INTRAVENOUS | Status: DC
  Filled 2021-06-20 (×11): qty 500

## 2021-06-20 MED ORDER — CLONIDINE HCL 0.1 MG PO TABS
0.1000 mg | ORAL_TABLET | Freq: Once | ORAL | Status: AC
  Administered 2021-06-20: 0.1 mg via ORAL
  Filled 2021-06-20: qty 1

## 2021-06-20 MED ORDER — FOLIC ACID 1 MG PO TABS
1.0000 mg | ORAL_TABLET | Freq: Every day | ORAL | Status: DC
  Administered 2021-06-20: 1 mg via ORAL
  Filled 2021-06-20 (×2): qty 1

## 2021-06-20 MED ORDER — ONDANSETRON HCL 4 MG/2ML IJ SOLN
4.0000 mg | Freq: Once | INTRAMUSCULAR | Status: AC
Start: 2021-06-20 — End: 2021-06-20
  Administered 2021-06-20: 4 mg via INTRAVENOUS
  Filled 2021-06-20: qty 2

## 2021-06-20 MED ORDER — ACETAMINOPHEN 325 MG PO TABS
650.0000 mg | ORAL_TABLET | Freq: Four times a day (QID) | ORAL | Status: DC | PRN
  Administered 2021-06-22 – 2021-06-23 (×2): 650 mg via ORAL
  Filled 2021-06-20 (×2): qty 2

## 2021-06-20 MED ORDER — LORAZEPAM 2 MG/ML IJ SOLN: 0.0000 mg | Freq: Three times a day (TID) | INTRAMUSCULAR | Status: DC

## 2021-06-20 MED ORDER — ACETAMINOPHEN 650 MG RE SUPP: 650.0000 mg | Freq: Four times a day (QID) | RECTAL | Status: DC | PRN

## 2021-06-20 MED ORDER — ADULT MULTIVITAMIN W/MINERALS CH
1.0000 | ORAL_TABLET | Freq: Every day | ORAL | Status: DC
  Administered 2021-06-20 – 2021-06-24 (×2): 1 via ORAL
  Filled 2021-06-20 (×3): qty 1

## 2021-06-20 MED ORDER — SODIUM CHLORIDE 3 % IV SOLN: INTRAVENOUS | Status: DC

## 2021-06-20 NOTE — ED Provider Notes (Signed)
? ?[facility] ?Provider Note ? ? ? Event Date/Time  ? First MD Initiated Contact with Patient 06/26/2021 [phone]   ?  (approximate) ? ? ?History  ? ?Weakness ? ? ?HPI ? ?Robert Coffey is a 51 y.o. male with history of obesity, chronic back pain who presents to the emergency department with intermittent confusion, generalized weakness over the past several weeks.  Wife reports he has just gotten so weak that he has not been able to get out of bed.  No falls.  He denies any fevers, cough, chest pain or shortness of breath.  Over the past several days has had diffuse abdominal cramping, nausea, vomiting and diarrhea.  Wife reports his urine has been very dark.  He did have 1 episode of urinary incontinence but this is because he was so weak he could not get out of bed and get to the bathroom.  No urinary retention.  No numbness, tingling or focal weakness.  Does have chronic back pain and has had recent epidural injections but states this has not changed.  No previous back surgery. ? ? ?History provided by patient and wife. ? ? ? ?History reviewed. No pertinent past medical history. ? ?History reviewed. No pertinent surgical history. ? ?MEDICATIONS:  ?Prior to Admission medications   ?Not on File  ? ? ?Physical Exam  ? ?Triage Vital Signs: ?ED Triage Vitals  ?Enc Vitals Group  ?   BP 06/10/2021 0553 (!) 136/119  ?   Pulse --   ?   Resp 06/17/2021 0553 18  ?   Temp 06/10/2021 0557 98.1 ?F (36.7 ?C)  ?   Temp Source 06/15/2021 0557 Oral  ?   SpO2 --   ?   Weight 06/21/2021 0549 203 lb (92.1 kg)  ?   Height 07/03/2021 0549 6\' 3"  (1.905 m)  ?   Head Circumference --   ?   Peak Flow --   ?   Pain Score 06/23/2021 0549 5  ?   Pain Loc --   ?   Pain Edu? --   ?   Excl. in [place]? --   ? ? ?Most recent vital signs: ?Vitals:  ? 06/29/2021 0700 06/06/2021 0730  ?BP: (!) 131/119 (!) 130/118  ?Pulse: (!) 106 (!) 107  ?Resp:  18  ?Temp:    ?SpO2: 95% 95%  ? ? ?CONSTITUTIONAL: Alert and oriented x 3 and responds appropriately to  questions.  Obese ?HEAD: Normocephalic, atraumatic ?EYES: Conjunctivae clear, patient does have some mild purulent discharge in both eyes, no chemosis, no hyphema or hypopyon, extraocular movements intact, pupils appear equal, sclera nonicteric ?ENT: normal nose; dry appearing mucous membranes ?NECK: Supple, normal ROM, no meningismus, no midline spinal tenderness or step-off or deformity ?CARD: Regular and tachycardic; S1 and S2 appreciated; no murmurs, no clicks, no rubs, no gallops ?RESP: Normal chest excursion without splinting or tachypnea; breath sounds clear and equal bilaterally; no wheezes, no rhonchi, no rales, no hypoxia or respiratory distress, speaking full sentences ?ABD/GI: Normal bowel sounds; non-distended; soft, or to palpation throughout the abdomen without guarding or rebound ?BACK: The back appears normal, no midline spinal tenderness or step-off or deformity ?EXT: Normal ROM in all joints; no deformity noted, no edema; no cyanosis, no calf tenderness or calf swelling, extremities warm and well perfused ?SKIN: Normal color for age and race; warm; no rash on exposed skin ?NEURO: Moves all extremities equally, normal speech, no drift, able to lift both legs off of the bed  without difficulty, normal sensation diffusely other than chronic neuropathy in his feet, cranial nerves II through XII intact, gait testing deferred, hyperreflexic in all 4 extremities, no clonus ?PSYCH: The patient's mood and manner are appropriate. ? ? ?ED Results / Procedures / Treatments  ? ?LABS: ?(all labs ordered are listed, but only abnormal results are displayed) ?Labs Reviewed  ?CBC WITH DIFFERENTIAL/PLATELET - Abnormal; Notable for the following components:  ?    Result Value  ? WBC 11.0 (*)   ? RBC 4.08 (*)   ? MCV 102.2 (*)   ? MCH 37.5 (*)   ? MCHC 36.7 (*)   ? Platelets 129 (*)   ? nRBC 0.7 (*)   ? Neutro Abs 8.1 (*)   ? Monocytes Absolute 1.8 (*)   ? Abs Immature Granulocytes 0.17 (*)   ? All other components  within normal limits  ?COMPREHENSIVE METABOLIC PANEL - Abnormal; Notable for the following components:  ? Sodium 109 (*)   ? Chloride 69 (*)   ? Glucose, Bld 108 (*)   ? Creatinine, Ser 1.62 (*)   ? Calcium 8.5 (*)   ? Albumin 3.1 (*)   ? AST 256 (*)   ? ALT 119 (*)   ? Total Bilirubin 5.0 (*)   ? GFR, Estimated 52 (*)   ? Anion gap 17 (*)   ? All other components within normal limits  ?LIPASE, BLOOD - Abnormal; Notable for the following components:  ? Lipase 86 (*)   ? All other components within normal limits  ?TROPONIN I (HIGH SENSITIVITY) - Abnormal; Notable for the following components:  ? Troponin I (High Sensitivity) 67 (*)   ? All other components within normal limits  ?ETHANOL  ?TSH  ?URINALYSIS, ROUTINE W REFLEX MICROSCOPIC  ?URINE DRUG SCREEN, QUALITATIVE ([facility] ONLY)  ?CBG MONITORING, ED  ?TROPONIN I (HIGH SENSITIVITY)  ? ? ? ?EKG: ? EKG Interpretation ? ?Date/Time:  Tuesday Jun 20 2021 06:46:49 EDT ?Ventricular Rate:  107 ?PR Interval:  122 ?QRS Duration: 83 ?QT Interval:  302 ?QTC Calculation: 403 ?R Axis:   -31 ?Text Interpretation: Sinus tachycardia Left axis deviation Low voltage, precordial leads Consider anterior infarct Baseline wander in lead(s) I II III aVR aVL aVF V1 V2 V3 V4 V5 V6 Confirmed by Pryor Curia [phone]) on 06/30/2021 6:57:53 AM ?  ? ?  ? ? ? ?RADIOLOGY: ?My personal review and interpretation of imaging: Pending ? ?I have personally reviewed all radiology reports.   ?No results found. ? ? ?PROCEDURES: ? ?Critical Care performed: Yes, see critical care procedure note(s) ? ? ?CRITICAL CARE ?Performed by: Cyril Mourning Monice Lundy ? ? ?Total critical care time: 45 minutes ? ?Critical care time was exclusive of separately billable procedures and treating other patients. ? ?Critical care was necessary to treat or prevent imminent or life-threatening deterioration. ? ?Critical care was time spent personally by me on the following activities: development of treatment plan with patient and/or surrogate  as well as nursing, discussions with consultants, evaluation of patient's response to treatment, examination of patient, obtaining history from patient or surrogate, ordering and performing treatments and interventions, ordering and review of laboratory studies, ordering and review of radiographic studies, pulse oximetry and re-evaluation of patient's condition. ? ? ?.1-3 Lead EKG Interpretation ?Performed by: Jevaun Strick, Delice Bison, DO ?Authorized by: Brevin Mcfadden, Delice Bison, DO  ? ?  Interpretation: abnormal   ?  ECG rate:  107 ?  ECG rate assessment: tachycardic   ?  Rhythm: sinus tachycardia   ?  Ectopy: none   ?  Conduction: normal   ? ? ? ?IMPRESSION / MDM / ASSESSMENT AND PLAN / ED COURSE  ?I reviewed the triage vital signs and the nursing notes. ? ? ? ?Patient with weakness, confusion, dark urine, vomiting and diarrhea. ? ?The patient is on the cardiac monitor to evaluate for evidence of arrhythmia and/or significant heart rate changes. ? ? ?DIFFERENTIAL DIAGNOSIS (includes but not limited to):   Intracranial hemorrhage, CVA, anemia, electrolyte derangement, thyroid dysfunction, dehydration, UTI, intoxication.  He does complain of back pain but has no focal neurologic deficits on exam.  Low suspicion for cauda equina, severe/critical spinal stenosis, epidural abscess or hematoma, discitis or osteomyelitis, transverse myelitis, Guillain-Barr?. ? ? ?PLAN: We will obtain CBC, CMP, lipase, urinalysis, CT head, CT of the abdomen pelvis, EKG, troponin.  Will give IV fluids, nausea medicine. ? ? ?MEDICATIONS GIVEN IN ED: ?Medications  ?0.9 %  sodium chloride infusion ( Intravenous New Bag/Given 06/17/2021 0648)  ?iohexol (OMNIPAQUE) 300 MG/ML solution 100 mL (has no administration in time range)  ?ondansetron [facility]) injection 4 mg (4 mg Intravenous Given 06/19/2021 0647)  ?sodium chloride 0.9 % bolus 1,000 mL (1,000 mLs Intravenous New Bag/Given 06/07/2021 0753)  ? ? ? ?ED COURSE: Patient's labs show leukocytosis of 11,000.  He is  extremely hyponatremic with a sodium of 109 but is oriented x3 here.  Glucose is normal.  He has an AKI with creatinine of 1.62.  LFTs and lipase elevated.  Troponin slightly elevated with no EKG changes.  TSH norma

## 2021-06-20 NOTE — H&P (Addendum)
?History and Physical  ? ? ?Patient: Robert Coffey IRJ:[id number] DOB: May 11, 1970 ?DOA: 06/23/2021 ?DOS: the patient was seen and examined on 06/10/2021 ?PCP: Renee Rival, NP  ?Patient coming from: Home ? ?Chief Complaint:  ?Chief Complaint  ?Patient presents with  ? Weakness  ? ?HPI: Robert Coffey is a 51 y.o. male with medical history significant for peripheral neuropathy, alcohol dependence who presents to the ER for evaluation of change in mental status, bilateral lower extremity weakness and difficulty getting around at home. ?Per patient his symptoms started several days ago and he has had trouble getting his thoughts together.  He notes that his legs are weak and he has difficulty getting around at home.  Patient's wife states that he has not been able to get out of bed due to weakness.  He denies having any falls.  He has no urinary retention, no focal weakness or tingling.  He did have an episode of incontinence but states that he was unable to get to the bathroom quickly.  He has a history of chronic back pain for which she gets epidural steroid injections and this has not changed. ?During my evaluation he is oriented to person, place and time but goes off on a tangent when asked questions ?He denies having any fever, no chills, no cough, no abdominal pain, no changes in his bowel habits, no dizziness, no lightheadedness, no blurred vision no focal deficit. ?He admits to daily alcohol use, usually liquor and last drink was the night prior to his admission. ? ?Review of Systems: As mentioned in the history of present illness. All other systems reviewed and are negative. ?History reviewed. No pertinent past medical history. ?History reviewed. No pertinent surgical history. ?Social History:  has no history on file for tobacco use, alcohol use, and drug use. ? ?Allergies  ?Allergen Reactions  ? Sulfa Antibiotics   ? ? ?History reviewed. No pertinent family history. ? ?Prior to Admission medications   ?Not  on File  ? ? ?Physical Exam: ?Vitals:  ? 06/18/2021 0630 06/14/2021 0700 06/19/2021 0730 06/11/2021 0923  ?BP: (!) 181/149 (!) 131/119 (!) 130/118 (!) 132/119  ?Pulse: (!) 108 (!) 106 (!) 107 (!) 105  ?Resp: (!) 22  18 18   ?Temp:      ?TempSrc:      ?SpO2: 100% 95% 95% 96%  ?Weight:      ?Height:      ? ?Physical Exam ?Vitals and nursing note reviewed.  ?Constitutional:   ?   Appearance: He is obese.  ?HENT:  ?   Head: Normocephalic and atraumatic.  ?   Nose: Nose normal.  ?   Mouth/Throat:  ?   Mouth: Mucous membranes are dry.  ?Eyes:  ?   Comments: Pale conjunctiva  ?Cardiovascular:  ?   Rate and Rhythm: Tachycardia present.  ?Pulmonary:  ?   Effort: Pulmonary effort is normal.  ?   Breath sounds: Normal breath sounds.  ?Abdominal:  ?   General: Bowel sounds are normal.  ?   Palpations: Abdomen is soft.  ?   Comments: Central adiposity  ?Musculoskeletal:     ?   General: Normal range of motion.  ?   Cervical back: Normal range of motion and neck supple.  ?Skin: ?   General: Skin is warm and dry.  ?Neurological:  ?   Mental Status: He is alert and oriented to person, place, and time.  ?   Motor: Weakness present.  ?Psychiatric:     ?  Mood and Affect: Mood normal.  ?   Comments: Very restless  ? ? ?Data Reviewed: ?Relevant notes from primary care and specialist visits, past discharge summaries as available in EHR, including Care Everywhere. ?Prior diagnostic testing as pertinent to current admission diagnoses ?Updated medications and problem lists for reconciliation ?ED course, including vitals, labs, imaging, treatment and response to treatment ?Triage notes, nursing and pharmacy notes and ED provider's notes ?Notable results as noted in HPI ?Labs reviewed.  Troponin 65, sodium 109, potassium 4.5, chloride 69, bicarb 23, glucose 108, BUN 8, creatinine 1.62 compared to baseline of 0.9 from 02/22, calcium 8.5, total protein 6.8, albumin 3.1, AST 256, ALT 119, total bilirubin 5.0, TSH 2.11, lipase 86, white count 11.0,  hemoglobin 15.3, hematocrit 41.7, platelet count 129 ?CT scan of the head without contrast shows fluid or debris in the left sinus tympani/middle ear, cannot ?exclude otitis media. There small bilateral mastoid effusions and chronic bilateral maxillary sinusitis. Advanced for age cerebral atrophy. Atherosclerosis. ?CT scan of abdomen and pelvis shows hepatic steatosis. 2.6 cm enhancing left adrenal nodule is noted. When the patient is clinically stable and able to follow directions and hold their breath (preferably as an outpatient) further evaluation with dedicated abdominal MRI should be considered. Aortic Atherosclerosis. ?Twelve-lead EKG reviewed by me shows sinus tachycardia.  Left axis deviation.  Low voltage precordial leads ?There are no new results to review at this time. ? ?Assessment and Plan: ?* Acute metabolic encephalopathy ?Appears to be multifactorial and secondary to severe hyponatremia as well as alcohol withdrawal. ?Expect improvement in patient's mental status following resolution of acute medical issues ? ?Alcohol withdrawal ([place]) ?Patient has a history of alcohol abuse and appears to have alcohol withdrawal symptoms as evidenced by marked tachycardia and hypertension. ?We will place patient on CIWA protocol and administer lorazepam for CIWA score of 8 or greater ?Place patient on MVI/thiamine/folic acid ?We will also place patient on clonidine 0.1 mg twice daily ? ?Hyponatremia ?Patient presents for evaluation of confusion and weakness and is found to have a serum sodium of 109 ?Obtain urine sodium, urine and serum osmolality levels ?Start patient on hypertonic saline ?Consult nephrology ? ?Acute kidney injury ([place]) ?Most likely secondary to dehydration and poor oral intake ?Patient's baseline serum creatinine is 0.9 and today on admission it is 1.6. ?Continue IV fluid resuscitation ?Repeat renal parameters in am  ?We will obtain renal ultrasound to rule out obstructive uropathy if renal  function does not improve with IV fluid hydration ? ?Transaminitis ?Patient noted to have marked transaminitis consistent with his history of alcohol use. ?AST >> ALT ?Patient has been counseled on the need to abstain from further alcohol use ? ? ? ? ? Advance Care Planning:   Code Status: Full Code  ? ?Consults: Nephrology ? ?Family Communication: Greater than 50% of time was spent discussing patient's condition and plan with him at the bedside.  All questions and concerns have been addressed.  He verbalizes understanding and agrees with the plan. ? ?Severity of Illness: ?The appropriate patient status for this patient is INPATIENT. Inpatient status is judged to be reasonable and necessary in order to provide the required intensity of service to ensure the patient's safety. The patient's presenting symptoms, physical exam findings, and initial radiographic and laboratory data in the context of their chronic comorbidities is felt to place them at high risk for further clinical deterioration. Furthermore, it is not anticipated that the patient will be medically stable for discharge from the hospital  within 2 midnights of admission.  ? ?* I certify that at the point of admission it is my clinical judgment that the patient will require inpatient hospital care spanning beyond 2 midnights from the point of admission due to high intensity of service, high risk for further deterioration and high frequency of surveillance required.* ? ?Author: ?Collier Bullock, MD ?06/25/2021 11:28 AM ? ?For on call review [email or web address].  ?

## 2021-06-20 NOTE — Assessment & Plan Note (Signed)
Most likely secondary to dehydration and poor oral intake ?Patient's baseline serum creatinine is 0.9 and today on admission it is 1.6. ?Continue IV fluid resuscitation ?Repeat renal parameters in am  ?We will obtain renal ultrasound to rule out obstructive uropathy if renal function does not improve with IV fluid hydration ?

## 2021-06-20 NOTE — ED Notes (Signed)
Arrived to patient room to find IV pump off. Pt stating "I hit those buttons, that machine was beeping" IV pump turned back on  infusion running. Pt educated on not touching IV pump at this time ?

## 2021-06-20 NOTE — ED Notes (Signed)
Called lab about 4pm NA lvl. Lab found tube and will start test at this time.  ?

## 2021-06-20 NOTE — Assessment & Plan Note (Signed)
Patient noted to have marked transaminitis consistent with his history of alcohol use. ?AST >> ALT ?Patient has been counseled on the need to abstain from further alcohol use ?

## 2021-06-20 NOTE — ED Provider Notes (Signed)
----------------------------------------- ?  9:44 AM on 06/22/2021 ?----------------------------------------- ?Patient care assumed from Dr. Leonides Schanz.  Patient's labs have resulted showing mild troponin elevation at 65.  Patient denies any chest pain.  Patient's CBC shows platelets of 129 with a normal H&H.  Patient's chemistry shows a sodium of 109, creatinine of 1.6 and elevated LFTs.  Patient is a daily drinker.  Drinks 3-4 liquor drinks per day per wife.  Patient's alcohol is negative, last drink was Sunday per wife.  Lipase mildly elevated 86.  CT scan showed no significant abnormality besides an adrenal mass which I discussed with the family.  However given the patient's significant hyponatremia patient will need to be admitted to the hospital for ongoing management electrolyte repletion.  During my evaluation patient remains somnolent will awaken to voice will answer questions but then falls back asleep and appears confused at times. ? ?Given the patient's continued somnolence and confusion with sodium of 109 I have ordered hypertonic saline infusion.  We will speak to the hospitalist to admit to stepdown. ? ?CRITICAL CARE ?Performed by: Harvest Dark ? ? ?Total critical care time: 30 minutes ? ?Critical care time was exclusive of separately billable procedures and treating other patients. ? ?Critical care was necessary to treat or prevent imminent or life-threatening deterioration. ? ?Critical care was time spent personally by me on the following activities: development of treatment plan with patient and/or surrogate as well as nursing, discussions with consultants, evaluation of patient's response to treatment, examination of patient, obtaining history from patient or surrogate, ordering and performing treatments and interventions, ordering and review of laboratory studies, ordering and review of radiographic studies, pulse oximetry and re-evaluation of patient's condition. ? ?  ?Harvest Dark,  MD ?06/19/2021 1534 ? ?

## 2021-06-20 NOTE — ED Notes (Signed)
Pt restless In bed stating their abdomen is cramping. Pt placed in non slip footwear and yellow bracelet placed. Pt bed alarm placed under patient and turned on. Call light given to patient and instructions for use reviewed. Pt stating they were diagnosed with covid last week and has been struggling with n/v/d since then. ?

## 2021-06-20 NOTE — Assessment & Plan Note (Signed)
Patient has a history of alcohol abuse and appears to have alcohol withdrawal symptoms as evidenced by marked tachycardia and hypertension. ?We will place patient on CIWA protocol and administer lorazepam for CIWA score of 8 or greater ?Place patient on MVI/thiamine/folic acid ?We will also place patient on clonidine 0.1 mg twice daily ?

## 2021-06-20 NOTE — ED Notes (Addendum)
PT wife stating that pt took 6-8 doses of 1000mg  tylenol on Sunday.Provider messaged for pt concerns ?

## 2021-06-20 NOTE — Consult Note (Signed)
MEDICATION RELATED CONSULT NOTE ? ?Pharmacy Consult for Hypertonic Saline Monitoring ?Indication: Severe hyponatremia ? ?Patient Measurements: ?Height: 6\' 3"  (190.5 cm) ?Weight: (!) 145.1 kg (319 lb 14.2 oz) ?IBW/kg (Calculated) : 84.5 ? ?Intake/Output from previous day: ?No intake/output data recorded. ?Intake/Output from this shift: ?No intake/output data recorded. ? ?Labs: ?Recent Labs  ?  06/06/2021 ?0629 06/10/2021 ?1207  ?WBC 11.0*  --   ?HGB 15.3  --   ?HCT 41.7  --   ?PLT 129*  --   ?CREATININE 1.62*  --   ?MG  --  1.7  ?ALBUMIN 3.1*  --   ?PROT 6.8  --   ?AST 256*  --   ?ALT 119*  --   ?ALKPHOS 116  --   ?BILITOT 5.0*  --   ? ? ?Estimated Creatinine Clearance: 85.7 mL/min (A) (by C-G formula based on SCr of 1.62 mg/dL (H)). ? ? ?Medications:  ?Remains on 3% NaCl at 50 cc/hr ? ?Assessment: ?Patient is a 51 y/o M with medical history including obesity, chronic back pain who presented to the ED 5/16 with weakness in setting of abdominal cramps, nausea, vomiting, and diarrhea. Labs on admission notable for Na 109, Cl 69, Scr 1.62, transaminitis with associated hyperbilirubinemia and mildly elevated lipase. Imaging notable for hepatic steatosis. Vitals with hypertension and tachycardia. Patient is being initiated on hypertonic saline for severe symptomatic hyponatremia. Pharmacy consulted to assist with monitoring to avoid overcorrection / risk for ODS.  ? ?MD Calc estimated rate of correction on current rate: ~0.4 mmol/L/hr ? ?Goal of Therapy:  ?Increase Na 8 - 12 mmol/L over 24h ?Notify provider if Na increases > 4 mmol/L over 2 hours or 6 mmol/L over 4 hours ? ?Plan:  ?Na: 108>111 (delta of 33mmol/L across 7hrs >> ~0.12mmol/hr) ?--Will continue to monitor Na and notify provider if above parameters met ?--Na q2h x 2 then q4h while on hypertonic saline ? ?Shanon Brow Tupac Jeffus ?06/29/2021,10:01 PM ? ? ? ?

## 2021-06-20 NOTE — ED Notes (Signed)
Patient transported to CT 

## 2021-06-20 NOTE — ED Triage Notes (Signed)
Pt arrives arrives via EMS. C/o of weakness x 2 weeks. Increased trouble getting around at home. C/o dark urine and incontinence x few days. Upon EMS on scene pt is confused more than normal and is unable to stand. 574ml bolus given by EMS. Pt alert and oriented at this time.  ?

## 2021-06-20 NOTE — Assessment & Plan Note (Signed)
Patient presents for evaluation of confusion and weakness and is found to have a serum sodium of 109 ?Obtain urine sodium, urine and serum osmolality levels ?Start patient on hypertonic saline ?Consult nephrology ?

## 2021-06-20 NOTE — Assessment & Plan Note (Signed)
Appears to be multifactorial and secondary to severe hyponatremia as well as alcohol withdrawal. ?Expect improvement in patient's mental status following resolution of acute medical issues ?

## 2021-06-20 NOTE — Consult Note (Addendum)
MEDICATION RELATED CONSULT NOTE ? ?Pharmacy Consult for Hypertonic Saline Monitoring ?Indication: Severe hyponatremia ? ?Patient Measurements: ?Height: 6\' 3"  (190.5 cm) ?Weight: 92.1 kg (203 lb) ?IBW/kg (Calculated) : 84.5 ? ?Intake/Output from previous day: ?No intake/output data recorded. ?Intake/Output from this shift: ?No intake/output data recorded. ? ?Labs: ?Recent Labs  ?  06/15/2021 ?0629  ?WBC 11.0*  ?HGB 15.3  ?HCT 41.7  ?PLT 129*  ?CREATININE 1.62*  ?ALBUMIN 3.1*  ?PROT 6.8  ?AST 256*  ?ALT 119*  ?ALKPHOS 116  ?BILITOT 5.0*  ? ?Estimated Creatinine Clearance: 66.6 mL/min (A) (by C-G formula based on SCr of 1.62 mg/dL (H)). ? ? ?Medications:  ?3% NaCl at 50 cc/hr ? ?Assessment: ?Patient is a 51 y/o M with medical history including obesity, chronic back pain who presented to the ED 5/16 with weakness in setting of abdominal cramps, nausea, vomiting, and diarrhea. Labs on admission notable for Na 109, Cl 69, Scr 1.62, transaminitis with associated hyperbilirubinemia and mildly elevated lipase. Imaging notable for hepatic steatosis. Vitals with hypertension and tachycardia. Patient is being initiated on hypertonic saline for severe symptomatic hyponatremia. Pharmacy consulted to assist with monitoring to avoid overcorrection / risk for ODS.  ? ?MD Calc estimated rate of correction on current rate: ~0.4 mmol/L/hr ? ?Goal of Therapy:  ?Increase Na 8 - 12 mmol/L over 24h ?Notify provider if Na increases > 4 mmol/L over 2 hours or 6 mmol/L over 4 hours ? ?Plan:  ?--Will continue to monitor Na and notify provider if above parameters met ?--Na q2h x 2 then q4h while on hypertonic saline ? ?Benita Gutter ?06/27/2021,9:58 AM ? ? ? ?

## 2021-06-20 NOTE — ED Notes (Signed)
Lab called stating that they have no been able to find the 4pm NA green top. This rn informed them that the tube must be there as the other tubes that were sent with that tube have resulted, so the tube must be there.  ?

## 2021-06-20 NOTE — ED Notes (Signed)
This nurse heard pts bed exit alarm going off from other pt room. I arrived to find patient attempting to stand from bed. Pharmacy Tech and Pharmacy Student in room for med rec at this time. Pt sittintg on foot of bed. Pt incomprehensible speech stating that the wants to get up. Pt assisted back to bed x2 assist. Bed alarm reset and pt instructed to not get up from bed.  ?

## 2021-06-21 DIAGNOSIS — E871 Hypo-osmolality and hyponatremia: Secondary | ICD-10-CM | POA: Diagnosis not present

## 2021-06-21 DIAGNOSIS — F10931 Alcohol use, unspecified with withdrawal delirium: Secondary | ICD-10-CM

## 2021-06-21 DIAGNOSIS — G9341 Metabolic encephalopathy: Secondary | ICD-10-CM | POA: Diagnosis not present

## 2021-06-21 LAB — CBC
HCT: 38.2 % — ABNORMAL LOW (ref 39.0–52.0)
Hemoglobin: 13.7 g/dL (ref 13.0–17.0)
MCH: 36.7 pg — ABNORMAL HIGH (ref 26.0–34.0)
MCHC: 35.9 g/dL (ref 30.0–36.0)
MCV: 102.4 fL — ABNORMAL HIGH (ref 80.0–100.0)
Platelets: 107 10*3/uL — ABNORMAL LOW (ref 150–400)
RBC: 3.73 MIL/uL — ABNORMAL LOW (ref 4.22–5.81)
RDW: 13.9 % (ref 11.5–15.5)
WBC: 11.1 10*3/uL — ABNORMAL HIGH (ref 4.0–10.5)
nRBC: 0.8 % — ABNORMAL HIGH (ref 0.0–0.2)

## 2021-06-21 LAB — SODIUM
Sodium: 114 mmol/L — CL (ref 135–145)
Sodium: 116 mmol/L — CL (ref 135–145)
Sodium: 116 mmol/L — CL (ref 135–145)
Sodium: 118 mmol/L — CL (ref 135–145)
Sodium: 121 mmol/L — ABNORMAL LOW (ref 135–145)
Sodium: 121 mmol/L — ABNORMAL LOW (ref 135–145)

## 2021-06-21 LAB — BASIC METABOLIC PANEL
Anion gap: 13 (ref 5–15)
BUN: 15 mg/dL (ref 6–20)
CO2: 21 mmol/L — ABNORMAL LOW (ref 22–32)
Calcium: 8.1 mg/dL — ABNORMAL LOW (ref 8.9–10.3)
Chloride: 79 mmol/L — ABNORMAL LOW (ref 98–111)
Creatinine, Ser: 2.08 mg/dL — ABNORMAL HIGH (ref 0.61–1.24)
GFR, Estimated: 39 mL/min — ABNORMAL LOW (ref >60)
Glucose, Bld: 86 mg/dL (ref 70–99)
Potassium: 4.1 mmol/L (ref 3.5–5.1)
Sodium: 113 mmol/L — CL (ref 135–145)

## 2021-06-21 MED ORDER — FOLIC ACID 5 MG/ML IJ SOLN
1.0000 mg | Freq: Every day | INTRAMUSCULAR | Status: DC
Start: 2021-06-21 — End: 2021-07-08
  Administered 2021-06-21 – 2021-07-07 (×17): 1 mg via INTRAVENOUS
  Filled 2021-06-21 (×18): qty 0.2

## 2021-06-21 NOTE — Consult Note (Signed)
[facility]  CONSULT NOTE    Date: 06/21/2021                  Patient Name:  Robert Coffey  MRN: [id number]  DOB: December 28, 1970  Age / Sex: 51 y.o., male         PCP: Renee Rival, NP                 Service Requesting Consult: [place]                 Reason for Consult: Hyponatremia            History of Present Illness: Robert Coffey is a 51 y.o.  male with past medical conditions including alcohol dependence, chronic back pain and peripheral neuropathy, who was admitted to [facility] on 06/15/2021 for Hyponatremia [E87.1] Elevated liver function tests [R79.89] Generalized weakness [R53.1] AKI (acute kidney injury) ([place]) [I29.7] Acute metabolic encephalopathy [L89.21]  Patient presented to the emergency department with complaints of lower extremity weakness and altered mental status.  Patiently currently feeling and evaluated bedside in ICU.  Appears extremely somnolent.  History obtained through chart review.  Patient lives at home with wife states he has not been able to get out of bed due to his weakness.  He has been unable to get around the house causing incontinence.  On admission, denies recent falls, cough, nausea and vomiting.  States he drinks liquor daily.  Labs on admission include sodium 109, glucose 108, creatinine 1.62 with GFR 52, calcium 8.5, anion gap 17, and albumin 3.1.Serum osmolality 236.  CT abdomen pelvis shows 2.6 cm left adrenal nodule with recommended outpatient follow-up.Respiratory panel negative for influenza and COVID-19.  Patient started on 3% hypertonic solution in the emergency department with closely monitoring sodium levels.   Medications: Outpatient medications: Medications Prior to Admission  Medication Sig Dispense Refill Last Dose   atorvastatin (LIPITOR) 20 MG tablet Take 20 mg by mouth daily.   06/19/2021 at 1200   cyanocobalamin 1000 MCG tablet Take by mouth.   Past Month   hydrochlorothiazide (HYDRODIURIL) 25 MG  tablet Take 25 mg by mouth daily.   06/19/2021   lisinopril (ZESTRIL) 40 MG tablet Take 40 mg by mouth daily.   06/19/2021 at 1800   rOPINIRole (REQUIP) 0.25 MG tablet Take 0.25 mg by mouth 3 (three) times daily.   06/19/2021 at pm   vitamin C (ASCORBIC ACID) 500 MG tablet Take 500 mg by mouth daily.      zolpidem (AMBIEN) 10 MG tablet Take 10 mg by mouth at bedtime as needed.   06/19/2021 at 2200    Current medications: Current Facility-Administered Medications  Medication Dose Route Frequency Provider Last Rate Last Admin   acetaminophen (TYLENOL) tablet 650 mg  650 mg Oral Q6H PRN Agbata, Tochukwu, MD       Or   acetaminophen (TYLENOL) suppository 650 mg  650 mg Rectal Q6H PRN Agbata, Tochukwu, MD       Chlorhexidine Gluconate Cloth 2 % PADS 6 each  6 each Topical Q0600 Agbata, Tochukwu, MD   6 each at 06/25/2021 2005   cloNIDine (CATAPRES) tablet 0.1 mg  0.1 mg Oral BID Agbata, Tochukwu, MD   0.1 mg at 06/07/2021 2119   enoxaparin (LOVENOX) injection 40 mg  40 mg Subcutaneous Q24H Agbata, Tochukwu, MD   40 mg at 19/41/74 0814   folic acid injection 1 mg  1 mg Intravenous Daily [name], [facility]  1 mg at 06/21/21 0919   LORazepam (ATIVAN) injection 0-4 mg  0-4 mg Intravenous Q4H Agbata, Tochukwu, MD   1 mg at 06/21/21 1134   Followed by   Derrill Memo ON 06/22/2021] LORazepam (ATIVAN) injection 0-4 mg  0-4 mg Intravenous Q8H Agbata, Tochukwu, MD       LORazepam (ATIVAN) tablet 1-4 mg  1-4 mg Oral Q1H PRN Agbata, Tochukwu, MD       Or   LORazepam (ATIVAN) injection 1-4 mg  1-4 mg Intravenous Q1H PRN Agbata, Tochukwu, MD       multivitamin with minerals tablet 1 tablet  1 tablet Oral Daily Agbata, Tochukwu, MD   1 tablet at 06/12/2021 1357   ondansetron (ZOFRAN) tablet 4 mg  4 mg Oral Q6H PRN Agbata, Tochukwu, MD       Or   ondansetron (ZOFRAN) injection 4 mg  4 mg Intravenous Q6H PRN Agbata, Tochukwu, MD       sodium chloride (hypertonic) 3 % solution   Intravenous Continuous Adriel Kessen, NP 30  mL/hr at 06/21/21 1025 New Bag at 06/21/21 1025   thiamine tablet 100 mg  100 mg Oral Daily Agbata, Tochukwu, MD   100 mg at 06/16/2021 1357   Or   thiamine (B-1) injection 100 mg  100 mg Intravenous Daily Agbata, Tochukwu, MD   100 mg at 06/21/21 0919      Allergies: Allergies  Allergen Reactions   Sulfa Antibiotics       Past Medical History: History reviewed. No pertinent past medical history.   Past Surgical History: History reviewed. No pertinent surgical history.   Family History: History reviewed. No pertinent family history.   Social History: Social History   Socioeconomic History   Marital status: Married    Spouse name: Not on file   Number of children: Not on file   Years of education: Not on file   Highest education level: Not on file  Occupational History   Not on file  Tobacco Use   Smoking status: Not on file   Smokeless tobacco: Not on file  Substance and Sexual Activity   Alcohol use: Not on file   Drug use: Not on file   Sexual activity: Not on file  Other Topics Concern   Not on file  Social History Narrative   Not on file   Social Determinants of Health   Financial Resource Strain: Not on file  Food Insecurity: Not on file  Transportation Needs: Not on file  Physical Activity: Not on file  Stress: Not on file  Social Connections: Not on file  Intimate Partner Violence: Not on file     Review of Systems: Review of Systems  Unable to perform ROS: Critical illness   Vital Signs: Blood pressure 132/77, pulse (!) 110, temperature 98.8 F (37.1 C), temperature source Axillary, resp. rate (!) 25, height 6\' 3"  (1.905 m), weight (!) 145.1 kg, SpO2 97 %.  Weight trends: Filed Weights   07/05/2021 0549 07/02/2021 2005  Weight: 92.1 kg (!) 145.1 kg    Physical Exam: General: NAD  Head: Normocephalic, atraumatic. Moist oral mucosal membranes  Eyes: Anicteric  Lungs:  Clear to auscultation, normal effort  Heart: Regular rate and rhythm   Abdomen:  Soft, nontender, obese  Extremities: Trace peripheral edema.  Neurologic: Somnolent, moving all four extremities  Skin: No lesions  Access: None     Lab results: Basic Metabolic Panel: Recent Labs  Lab 07/05/2021 0629 06/30/2021 1015 06/24/2021 1207 06/25/2021 1408 06/21/21  8466 06/21/21 0920 06/21/21 1311  NA 109*   < > 108*   < > 113* 116* 116*  K 4.5  --   --   --  4.1  --   --   CL 69*  --   --   --  79*  --   --   CO2 23  --   --   --  21*  --   --   GLUCOSE 108*  --   --   --  86  --   --   BUN 8  --   --   --  15  --   --   CREATININE 1.62*  --   --   --  2.08*  --   --   CALCIUM 8.5*  --   --   --  8.1*  --   --   MG  --   --  1.7  --   --   --   --    < > = values in this interval not displayed.    Liver Function Tests: Recent Labs  Lab 06/11/2021 0629  AST 256*  ALT 119*  ALKPHOS 116  BILITOT 5.0*  PROT 6.8  ALBUMIN 3.1*   Recent Labs  Lab 06/15/2021 0629  LIPASE 86*   No results for input(s): AMMONIA in the last 168 hours.  CBC: Recent Labs  Lab 07/02/2021 0629 06/21/21 0331  WBC 11.0* 11.1*  NEUTROABS 8.1*  --   HGB 15.3 13.7  HCT 41.7 38.2*  MCV 102.2* 102.4*  PLT 129* 107*    Cardiac Enzymes: No results for input(s): CKTOTAL, CKMB, CKMBINDEX, TROPONINI in the last 168 hours.  BNP: Invalid input(s): POCBNP  CBG: Recent Labs  Lab 07/05/2021 2004  GLUCAP 99    Microbiology: Results for orders placed or performed during the hospital encounter of 06/29/2021  Resp Panel by RT-PCR (Flu A&B, Covid) Nasopharyngeal Swab     Status: None   Collection Time: 06/07/2021  4:08 PM   Specimen: Nasopharyngeal Swab; Nasopharyngeal(NP) swabs in vial transport medium  Result Value Ref Range Status   SARS Coronavirus 2 by RT PCR NEGATIVE NEGATIVE Final    Comment: (NOTE) SARS-CoV-2 target nucleic acids are NOT DETECTED.  The SARS-CoV-2 RNA is generally detectable in upper respiratory specimens during the acute phase of infection. The  lowest concentration of SARS-CoV-2 viral copies this assay can detect is 138 copies/mL. A negative result does not preclude SARS-Cov-2 infection and should not be used as the sole basis for treatment or other patient management decisions. A negative result may occur with  improper specimen collection/handling, submission of specimen other than nasopharyngeal swab, presence of viral mutation(s) within the areas targeted by this assay, and inadequate number of viral copies(<138 copies/mL). A negative result must be combined with clinical observations, patient history, and epidemiological information. The expected result is Negative.  Fact Sheet for Patients:  EntrepreneurPulse.com.au  Fact Sheet for Healthcare Providers:  IncredibleEmployment.be  This test is no t yet approved or cleared by the [place] FDA and  has been authorized for detection and/or diagnosis of SARS-CoV-2 by FDA under an Emergency Use Authorization (EUA). This EUA will remain  in effect (meaning this test can be used) for the duration of the COVID-19 declaration under Section 564(b)(1) of the Act, 21 U.S.C.section 360bbb-3(b)(1), unless the authorization is terminated  or revoked sooner.       Influenza A by PCR NEGATIVE NEGATIVE Final  Influenza B by PCR NEGATIVE NEGATIVE Final    Comment: (NOTE) The Xpert Xpress SARS-CoV-2/FLU/RSV plus assay is intended as an aid in the diagnosis of influenza from Nasopharyngeal swab specimens and should not be used as a sole basis for treatment. Nasal washings and aspirates are unacceptable for Xpert Xpress SARS-CoV-2/FLU/RSV testing.  Fact Sheet for Patients: EntrepreneurPulse.com.au  Fact Sheet for Healthcare Providers: IncredibleEmployment.be  This test is not yet approved or cleared by the [place] FDA and has been authorized for detection and/or diagnosis of SARS-CoV-2 by FDA under  an Emergency Use Authorization (EUA). This EUA will remain in effect (meaning this test can be used) for the duration of the COVID-19 declaration under Section 564(b)(1) of the Act, 21 U.S.C. section 360bbb-3(b)(1), unless the authorization is terminated or revoked.  Performed at [facility], [address] [place], [place] [address]   MRSA Next Gen by PCR, Nasal     Status: None   Collection Time: 06/12/2021  8:23 PM   Specimen: Nasal Mucosa; Nasal Swab  Result Value Ref Range Status   MRSA by PCR Next Gen NOT DETECTED NOT DETECTED Final    Comment: (NOTE) The GeneXpert MRSA Assay (FDA approved for NASAL specimens only), is one component of a comprehensive MRSA colonization surveillance program. It is not intended to diagnose MRSA infection nor to guide or monitor treatment for MRSA infections. Test performance is not FDA approved in patients less than 12 years old. Performed at [facility], [address] [place], [place] [address]     Coagulation Studies: No results for input(s): LABPROT, INR in the last 72 hours.  Urinalysis: Recent Labs    06/13/2021 0629  COLORURINE AMBER*  LABSPEC 1.043*  PHURINE 5.0  GLUCOSEU NEGATIVE  HGBUR SMALL*  BILIRUBINUR SMALL*  KETONESUR 5*  PROTEINUR 30*  NITRITE NEGATIVE  LEUKOCYTESUR NEGATIVE      Imaging: CT HEAD WO CONTRAST (5MM)  Result Date: 07/02/2021 CLINICAL DATA:  Altered mental status. Weakness for 2 weeks. Urinary incontinence. Confusion. EXAM: CT HEAD WITHOUT CONTRAST TECHNIQUE: Contiguous axial images were obtained from the base of the skull through the vertex without intravenous contrast. RADIATION DOSE REDUCTION: This exam was performed according to the departmental dose-optimization program which includes automated exposure control, adjustment of the mA and/or kV according to patient size and/or use of iterative reconstruction technique. COMPARISON:  None Available. FINDINGS: Brain: Advanced for age  cerebral atrophy especially involving the frontal lobes. Otherwise, the brainstem, cerebellum, cerebral peduncles, thalamus, basal ganglia, basilar cisterns, and ventricular system appear within normal limits. No intracranial hemorrhage, mass lesion, or acute CVA. Vascular: There is atherosclerotic calcification of the cavernous carotid arteries bilaterally. Skull: Unremarkable Sinuses/Orbits: Chronic bilateral maxillary sinusitis. Small bilateral mastoid effusions. Fluid or debris in the left sinus tympani on images 16-17 of series 2. Other: No supplemental non-categorized findings. IMPRESSION: 1. Fluid or debris in the left sinus tympani/middle ear, cannot exclude otitis media. There small bilateral mastoid effusions and chronic bilateral maxillary sinusitis. 2. Advanced for age cerebral atrophy. 3. Atherosclerosis. Electronically Signed   By: Van Clines M.D.   On: 06/09/2021 09:01   CT ABDOMEN PELVIS W CONTRAST  Result Date: 06/18/2021 CLINICAL DATA:  Acute generalized abdominal pain. EXAM: CT ABDOMEN AND PELVIS WITH CONTRAST TECHNIQUE: Multidetector CT imaging of the abdomen and pelvis was performed using the standard protocol following bolus administration of intravenous contrast. RADIATION DOSE REDUCTION: This exam was performed according to the departmental dose-optimization program which includes automated exposure control, adjustment  of the mA and/or kV according to patient size and/or use of iterative reconstruction technique. CONTRAST:  192mL OMNIPAQUE IOHEXOL 300 MG/ML  SOLN COMPARISON:  None Available. FINDINGS: Lower chest: No acute abnormality. Hepatobiliary: No gallstones or biliary dilatation is noted. Hepatic steatosis is noted. Pancreas: Unremarkable. No pancreatic ductal dilatation or surrounding inflammatory changes. Spleen: Normal in size without focal abnormality. Adrenals/Urinary Tract: 2.6 cm enhancing left adrenal nodule is noted. Right adrenal gland appears normal. No  hydronephrosis or renal obstruction is noted. No renal or ureteral calculi are noted. Urinary bladder is unremarkable. Stomach/Bowel: Stomach is within normal limits. Appendix appears normal. No evidence of bowel wall thickening, distention, or inflammatory changes. Vascular/Lymphatic: Aortic atherosclerosis. No enlarged abdominal or pelvic lymph nodes. Reproductive: Prostate is unremarkable. Other: No abdominal wall hernia or abnormality. No abdominopelvic ascites. Musculoskeletal: No acute or significant osseous findings. IMPRESSION: Hepatic steatosis. 2.6 cm enhancing left adrenal nodule is noted. When the patient is clinically stable and able to follow directions and hold their breath (preferably as an outpatient) further evaluation with dedicated abdominal MRI should be considered. Aortic Atherosclerosis (ICD10-I70.0). Electronically Signed   By: Marijo Conception M.D.   On: 06/12/2021 09:01     Assessment & Plan: Robert Coffey is a 51 y.o.  male with past medical history including alcohol abuse, chronic back pain, and peripheral neuropathy, who was admitted to [facility] on 06/23/2021 for Hyponatremia [E87.1] Elevated liver function tests [R79.89] Generalized weakness [R53.1] AKI (acute kidney injury) ([place]) [K35.4] Acute metabolic encephalopathy [S56.81]   Hyponatremia likely due to poor nutrition and ETOH consumption.Sodium on admission 109.  Patient started on 3% hypertonic solution and transferred to ICU. IVF decreased to 67ml/hr. Will monitor sodium frequently. Target increase 53mmol per 24 hours. Goal sodium 128-130 before transitioning to oral agents.   2.  Acute kidney injury with last known creatinine baseline of 0.9 on 03/11/2020. Suspected due poor oral intake with a component of IV contrast exposure on 06/09/2021. Creatinine 2.08. IVF ordered. Will continue to monitor.   3.  Acute metabolic acidosis. Bicarb 21. Will monitor    LOS: 1 Ronetta Molla 5/17/20232:41 PM

## 2021-06-21 NOTE — Progress Notes (Signed)
Pt remains confused and restless; unable to reorient. Pt without urine output, holding genitals, and appearing uncomfortable. Bladder scan showed volume of 148mL. Jimmye Norman MD notified. In and out catheter resulting with volume of 430mL. Pt remains restless but not agitated. ?

## 2021-06-21 NOTE — Progress Notes (Signed)
?PROGRESS NOTE ? ? ? ?Robert Coffey  VEL:[id number] DOB: [birth year] DOA: 06/07/2021 ?PCP: Renee Rival, NP  ? ?Assessment & Plan: ?  ?Principal Problem: ?  Acute metabolic encephalopathy ?Active Problems: ?  Alcohol withdrawal ([place]) ?  Hyponatremia ?  Acute kidney injury ([place]) ?  Transaminitis ? ?Assessment and Plan: ?Acute metabolic encephalopathy: likely secondary to severe hyponatremia & alcohol w/drawal  ?  ?Alcohol withdrawal: continue on CIWA protocol. Continue on clonidine. Needs alcohol cessation counseling but too lethargic currenty  ?  ?Hyponatremia: likely secondary to alcohol abuse. Continue on 3% sodium. Continue w/ sodium level q4H. Nephro consulted  ?  ?AKI: likely secondary to dehydration & poor oral intake. Continue on IVFs.  ?  ?Transaminitis: likely secondary to alcohol abuse. Will continue to monitor  ? ?Morbid obesity: BMI 40. Complicates overall care & prognosis  ? ?Thrombocytopenia: likely secondary to alcohol abuse. Will continue to monitor ? ?Leukocytosis: likely reactive  ? ?DVT prophylaxis: lovenox  ?Code Status:  full  ?Family Communication:  ?Disposition Plan: unclear  ? ?Level of care: Stepdown ? ?Status is: Inpatient ?Remains inpatient appropriate because: hyponatremia  ? ? ? ?Consultants:  ?Nephro  ? ?Procedures:  ? ?Antimicrobials:  ? ? ?Subjective: ?Pt is very lethargic & unable to answer questions appropriately  ? ?Objective: ?Vitals:  ? 06/21/21 0500 06/21/21 0600 06/21/21 0700 06/21/21 0730  ?BP: 125/90 108/79 121/85 (!) 124/93  ?Pulse: (!) 106 (!) 109 (!) 106 (!) 105  ?Resp: 17     ?Temp:      ?TempSrc:      ?SpO2: 92% 98% 97%   ?Weight:      ?Height:      ? ?No intake or output data in the 24 hours ending 06/21/21 0814 ?Filed Weights  ? 06/14/2021 0549 06/29/2021 2005  ?Weight: 92.1 kg (!) 145.1 kg  ? ? ?Examination: ? ?General exam: Appears lethargic & uncomfortable. Morbidly obese ?Respiratory system: diminished breath sounds b/l  ?Cardiovascular system: S1 & S2+. No rubs,  gallops or clicks.  ?Gastrointestinal system: Abdomen is obese, soft and nontender. Normal bowel sounds heard. ?Central nervous system: lethargic. Moves all extremities  ?Psychiatry: Judgement and insight appears poor. Flat mood and affect ? ? ? ?Data Reviewed: I have personally reviewed following labs and imaging studies ? ?CBC: ?Recent Labs  ?Lab 07/05/2021 ?0629 06/21/21 ?0331  ?WBC 11.0* 11.1*  ?NEUTROABS 8.1*  --   ?HGB 15.3 13.7  ?HCT 41.7 38.2*  ?MCV 102.2* 102.4*  ?PLT 129* 107*  ? ?Basic Metabolic Panel: ?Recent Labs  ?Lab 06/19/2021 ?0629 06/07/2021 ?1015 06/25/2021 ?1207 06/10/2021 ?1408 06/08/2021 ?2059 06/30/2021 ?2344 06/21/21 ?0331  ?NA 109*   < > 108* 108* 111* 114* 113*  ?K 4.5  --   --   --   --   --  4.1  ?CL 69*  --   --   --   --   --  79*  ?CO2 23  --   --   --   --   --  21*  ?GLUCOSE 108*  --   --   --   --   --  86  ?BUN 8  --   --   --   --   --  15  ?CREATININE 1.62*  --   --   --   --   --  2.08*  ?CALCIUM 8.5*  --   --   --   --   --  8.1*  ?MG  --   --  1.7  --   --   --   --   ? < > = values in this interval not displayed.  ? ?GFR: ?Estimated Creatinine Clearance: 66.8 mL/min (A) (by C-G formula based on SCr of 2.08 mg/dL (H)). ?Liver Function Tests: ?Recent Labs  ?Lab 06/05/2021 ?0629  ?AST 256*  ?ALT 119*  ?ALKPHOS 116  ?BILITOT 5.0*  ?PROT 6.8  ?ALBUMIN 3.1*  ? ?Recent Labs  ?Lab 06/13/2021 ?0629  ?LIPASE 86*  ? ?No results for input(s): AMMONIA in the last 168 hours. ?Coagulation Profile: ?No results for input(s): INR, PROTIME in the last 168 hours. ?Cardiac Enzymes: ?No results for input(s): CKTOTAL, CKMB, CKMBINDEX, TROPONINI in the last 168 hours. ?BNP (last 3 results) ?No results for input(s): PROBNP in the last 8760 hours. ?HbA1C: ?No results for input(s): HGBA1C in the last 72 hours. ?CBG: ?Recent Labs  ?Lab 06/08/2021 ?2004  ?GLUCAP 99  ? ?Lipid Profile: ?No results for input(s): CHOL, HDL, LDLCALC, TRIG, CHOLHDL, LDLDIRECT in the last 72 hours. ?Thyroid Function Tests: ?Recent Labs  ?   07/01/2021 ?0629  ?TSH 2.119  ? ?Anemia Panel: ?No results for input(s): VITAMINB12, FOLATE, FERRITIN, TIBC, IRON, RETICCTPCT in the last 72 hours. ?Sepsis Labs: ?No results for input(s): PROCALCITON, LATICACIDVEN in the last 168 hours. ? ?Recent Results (from the past 240 hour(s))  ?Resp Panel by RT-PCR (Flu A&B, Covid) Nasopharyngeal Swab     Status: None  ? Collection Time: 06/05/2021  4:08 PM  ? Specimen: Nasopharyngeal Swab; Nasopharyngeal(NP) swabs in vial transport medium  ?Result Value Ref Range Status  ? SARS Coronavirus 2 by RT PCR NEGATIVE NEGATIVE Final  ?  Comment: (NOTE) ?SARS-CoV-2 target nucleic acids are NOT DETECTED. ? ?The SARS-CoV-2 RNA is generally detectable in upper respiratory ?specimens during the acute phase of infection. The lowest ?concentration of SARS-CoV-2 viral copies this assay can detect is ?138 copies/mL. A negative result does not preclude SARS-Cov-2 ?infection and should not be used as the sole basis for treatment or ?other patient management decisions. A negative result may occur with  ?improper specimen collection/handling, submission of specimen other ?than nasopharyngeal swab, presence of viral mutation(s) within the ?areas targeted by this assay, and inadequate number of viral ?copies(<138 copies/mL). A negative result must be combined with ?clinical observations, patient history, and epidemiological ?information. The expected result is Negative. ? ?Fact Sheet for Patients:  ?EntrepreneurPulse.com.au ? ?Fact Sheet for Healthcare Providers:  ?IncredibleEmployment.be ? ?This test is no t yet approved or cleared by the [place] FDA and  ?has been authorized for detection and/or diagnosis of SARS-CoV-2 by ?FDA under an Emergency Use Authorization (EUA). This EUA will remain  ?in effect (meaning this test can be used) for the duration of the ?COVID-19 declaration under Section 564(b)(1) of the Act, 21 ?U.S.C.section 360bbb-3(b)(1), unless the  authorization is terminated  ?or revoked sooner.  ? ? ?  ? Influenza A by PCR NEGATIVE NEGATIVE Final  ? Influenza B by PCR NEGATIVE NEGATIVE Final  ?  Comment: (NOTE) ?The Xpert Xpress SARS-CoV-2/FLU/RSV plus assay is intended as an aid ?in the diagnosis of influenza from Nasopharyngeal swab specimens and ?should not be used as a sole basis for treatment. Nasal washings and ?aspirates are unacceptable for Xpert Xpress SARS-CoV-2/FLU/RSV ?testing. ? ?Fact Sheet for Patients: ?EntrepreneurPulse.com.au ? ?Fact Sheet for Healthcare Providers: ?IncredibleEmployment.be ? ?This test is not yet approved or cleared by the [place] FDA and ?has been authorized for detection and/or diagnosis of SARS-CoV-2 by ?FDA under an  Emergency Use Authorization (EUA). This EUA will remain ?in effect (meaning this test can be used) for the duration of the ?COVID-19 declaration under Section 564(b)(1) of the Act, 21 U.S.C. ?section 360bbb-3(b)(1), unless the authorization is terminated or ?revoked. ? ?Performed at [facility], [place], ?[place] [address] ?  ?MRSA Next Gen by PCR, Nasal     Status: None  ? Collection Time: 06/13/2021  8:23 PM  ? Specimen: Nasal Mucosa; Nasal Swab  ?Result Value Ref Range Status  ? MRSA by PCR Next Gen NOT DETECTED NOT DETECTED Final  ?  Comment: (NOTE) ?The GeneXpert MRSA Assay (FDA approved for NASAL specimens only), ?is one component of a comprehensive MRSA colonization surveillance ?program. It is not intended to diagnose MRSA infection nor to guide ?or monitor treatment for MRSA infections. ?Test performance is not FDA approved in patients less than 2 years ?old. ?Performed at [facility], [place], ?[place] [address] ?  ?  ? ? ? ? ? ?Radiology Studies: ?CT HEAD WO CONTRAST (5MM) ? ?Result Date: 07/03/2021 ?CLINICAL DATA:  Altered mental status. Weakness for 2 weeks. Urinary incontinence. Confusion. EXAM: CT HEAD  WITHOUT CONTRAST TECHNIQUE: Contiguous axial images were obtained from the base of the skull through the vertex without intravenous contrast. RADIATION DOSE REDUCTION: This exam was performed according to the de

## 2021-06-21 NOTE — Consult Note (Signed)
MEDICATION RELATED CONSULT NOTE ? ?Pharmacy Consult for Hypertonic Saline Monitoring ?Indication: Severe hyponatremia ? ?Patient Measurements: ?Height: 6' 3" (190.5 cm) ?Weight: (!) 145.1 kg (319 lb 14.2 oz) ?IBW/kg (Calculated) : 84.5 ? ?Intake/Output from previous day: ?No intake/output data recorded. ?Intake/Output from this shift: ?No intake/output data recorded. ? ?Labs: ?Recent Labs  ?  06/07/2021 ?0629 06/13/2021 ?1207 06/21/21 ?0331  ?WBC 11.0*  --  11.1*  ?HGB 15.3  --  13.7  ?HCT 41.7  --  38.2*  ?PLT 129*  --  107*  ?CREATININE 1.62*  --  2.08*  ?MG  --  1.7  --   ?ALBUMIN 3.1*  --   --   ?PROT 6.8  --   --   ?AST 256*  --   --   ?ALT 119*  --   --   ?ALKPHOS 116  --   --   ?BILITOT 5.0*  --   --   ? ? ?Estimated Creatinine Clearance: 66.8 mL/min (A) (by C-G formula based on SCr of 2.08 mg/dL (H)). ? ? ?Medications:  ?3% NaCl at 30 cc/hr ? ?Assessment: ?Patient is a 50 y/o M with medical history including obesity, chronic back pain who presented to the ED 5/16 with weakness in setting of abdominal cramps, nausea, vomiting, and diarrhea. Labs on admission notable for Na 109, Cl 69, Scr 1.62, transaminitis with associated hyperbilirubinemia and mildly elevated lipase. Imaging notable for hepatic steatosis. Vitals with hypertension and tachycardia. Patient was initiated on hypertonic saline for severe symptomatic hyponatremia. Pharmacy consulted to assist with monitoring to avoid overcorrection / risk for ODS.  ? ?Goal of Therapy:  ?Increase Na 8 - 12 mmol/L over 24h ?Notify provider if Na increases > 4 mmol/L over 2 hours or 6 mmol/L over 4 hours ? ?Plan: delta of 8mmol/L over 24 hrs ?--Will continue to monitor Na and notify provider if above parameters met ?--Na q4h while on hypertonic saline ? ?Rodney D Grubb ?06/21/2021,6:54 AM ? ? ? ?

## 2021-06-22 ENCOUNTER — Inpatient Hospital Stay: Payer: BC Managed Care – PPO

## 2021-06-22 DIAGNOSIS — J9601 Acute respiratory failure with hypoxia: Secondary | ICD-10-CM | POA: Diagnosis not present

## 2021-06-22 DIAGNOSIS — F10931 Alcohol use, unspecified with withdrawal delirium: Secondary | ICD-10-CM | POA: Diagnosis not present

## 2021-06-22 DIAGNOSIS — G9341 Metabolic encephalopathy: Secondary | ICD-10-CM | POA: Diagnosis not present

## 2021-06-22 DIAGNOSIS — E871 Hypo-osmolality and hyponatremia: Secondary | ICD-10-CM | POA: Diagnosis not present

## 2021-06-22 LAB — CBC
HCT: 40.8 % (ref 39.0–52.0)
HCT: 41.8 % (ref 39.0–52.0)
Hemoglobin: 14.2 g/dL (ref 13.0–17.0)
Hemoglobin: 14 g/dL (ref 13.0–17.0)
MCH: 37.4 pg — ABNORMAL HIGH (ref 26.0–34.0)
MCH: 37.3 pg — ABNORMAL HIGH (ref 26.0–34.0)
MCHC: 34.8 g/dL (ref 30.0–36.0)
MCHC: 33.5 g/dL (ref 30.0–36.0)
MCV: 107.4 fL — ABNORMAL HIGH (ref 80.0–100.0)
MCV: 111.5 fL — ABNORMAL HIGH (ref 80.0–100.0)
Platelets: 135 10*3/uL — ABNORMAL LOW (ref 150–400)
Platelets: 132 10*3/uL — ABNORMAL LOW (ref 150–400)
RBC: 3.8 MIL/uL — ABNORMAL LOW (ref 4.22–5.81)
RBC: 3.75 MIL/uL — ABNORMAL LOW (ref 4.22–5.81)
RDW: 14.9 % (ref 11.5–15.5)
RDW: 16.1 % — ABNORMAL HIGH (ref 11.5–15.5)
WBC: 10.2 10*3/uL (ref 4.0–10.5)
WBC: 6.8 10*3/uL (ref 4.0–10.5)
nRBC: 0.8 % — ABNORMAL HIGH (ref 0.0–0.2)
nRBC: 4 % — ABNORMAL HIGH (ref 0.0–0.2)

## 2021-06-22 LAB — LACTIC ACID, PLASMA
Lactic Acid, Venous: 6.5 mmol/L (ref 0.5–1.9)
Lactic Acid, Venous: 6.7 mmol/L (ref 0.5–1.9)

## 2021-06-22 LAB — BLOOD GAS, ARTERIAL
Acid-base deficit: 0.3 mmol/L (ref 0.0–2.0)
Bicarbonate: 27.4 mmol/L (ref 20.0–28.0)
FIO2: 100 %
MECHVT: 550 mL
Mechanical Rate: 16
O2 Saturation: 97.4 %
PEEP: 10 cmH2O
Patient temperature: 37
pCO2 arterial: 57 mmHg — ABNORMAL HIGH (ref 32–48)
pH, Arterial: 7.29 — ABNORMAL LOW (ref 7.35–7.45)
pO2, Arterial: 83 mmHg (ref 83–108)

## 2021-06-22 LAB — APTT: aPTT: 29 seconds (ref 24–36)

## 2021-06-22 LAB — D-DIMER, QUANTITATIVE: D-Dimer, Quant: 7.02 ug/mL-FEU — ABNORMAL HIGH (ref 0.00–0.50)

## 2021-06-22 LAB — BASIC METABOLIC PANEL
Anion gap: 9 (ref 5–15)
Anion gap: 16 — ABNORMAL HIGH (ref 5–15)
BUN: 27 mg/dL — ABNORMAL HIGH (ref 6–20)
BUN: 35 mg/dL — ABNORMAL HIGH (ref 6–20)
CO2: 24 mmol/L (ref 22–32)
CO2: 20 mmol/L — ABNORMAL LOW (ref 22–32)
Calcium: 8.3 mg/dL — ABNORMAL LOW (ref 8.9–10.3)
Calcium: 8.3 mg/dL — ABNORMAL LOW (ref 8.9–10.3)
Chloride: 90 mmol/L — ABNORMAL LOW (ref 98–111)
Chloride: 94 mmol/L — ABNORMAL LOW (ref 98–111)
Creatinine, Ser: 1.96 mg/dL — ABNORMAL HIGH (ref 0.61–1.24)
Creatinine, Ser: 2.88 mg/dL — ABNORMAL HIGH (ref 0.61–1.24)
GFR, Estimated: 41 mL/min — ABNORMAL LOW (ref >60)
GFR, Estimated: 26 mL/min — ABNORMAL LOW (ref >60)
Glucose, Bld: 89 mg/dL (ref 70–99)
Glucose, Bld: 77 mg/dL (ref 70–99)
Potassium: 4.1 mmol/L (ref 3.5–5.1)
Potassium: 4.3 mmol/L (ref 3.5–5.1)
Sodium: 123 mmol/L — ABNORMAL LOW (ref 135–145)
Sodium: 130 mmol/L — ABNORMAL LOW (ref 135–145)

## 2021-06-22 LAB — GLUCOSE, CAPILLARY
Glucose-Capillary: 105 mg/dL — ABNORMAL HIGH (ref 70–99)
Glucose-Capillary: 70 mg/dL (ref 70–99)
Glucose-Capillary: 90 mg/dL (ref 70–99)

## 2021-06-22 LAB — VITAMIN B12: Vitamin B-12: 2412 pg/mL — ABNORMAL HIGH (ref 180–914)

## 2021-06-22 LAB — MAGNESIUM: Magnesium: 2.4 mg/dL (ref 1.7–2.4)

## 2021-06-22 LAB — AMMONIA: Ammonia: 96 umol/L — ABNORMAL HIGH (ref 9–35)

## 2021-06-22 LAB — SODIUM
Sodium: 123 mmol/L — ABNORMAL LOW (ref 135–145)
Sodium: 125 mmol/L — ABNORMAL LOW (ref 135–145)
Sodium: 128 mmol/L — ABNORMAL LOW (ref 135–145)
Sodium: 128 mmol/L — ABNORMAL LOW (ref 135–145)

## 2021-06-22 LAB — PROTIME-INR
INR: 1.5 — ABNORMAL HIGH (ref 0.8–1.2)
Prothrombin Time: 18 seconds — ABNORMAL HIGH (ref 11.4–15.2)

## 2021-06-22 LAB — TROPONIN I (HIGH SENSITIVITY): Troponin I (High Sensitivity): 67 ng/L — ABNORMAL HIGH (ref <18)

## 2021-06-22 IMAGING — DX DG ABDOMEN 1V
1 series · 1 of 1 positions shown · non-contrast
Comparison: Chest radiograph [DATE]

CLINICAL DATA: Evaluate OG tube placement.

EXAM:
ABDOMEN - 1 VIEW

[chest ap]
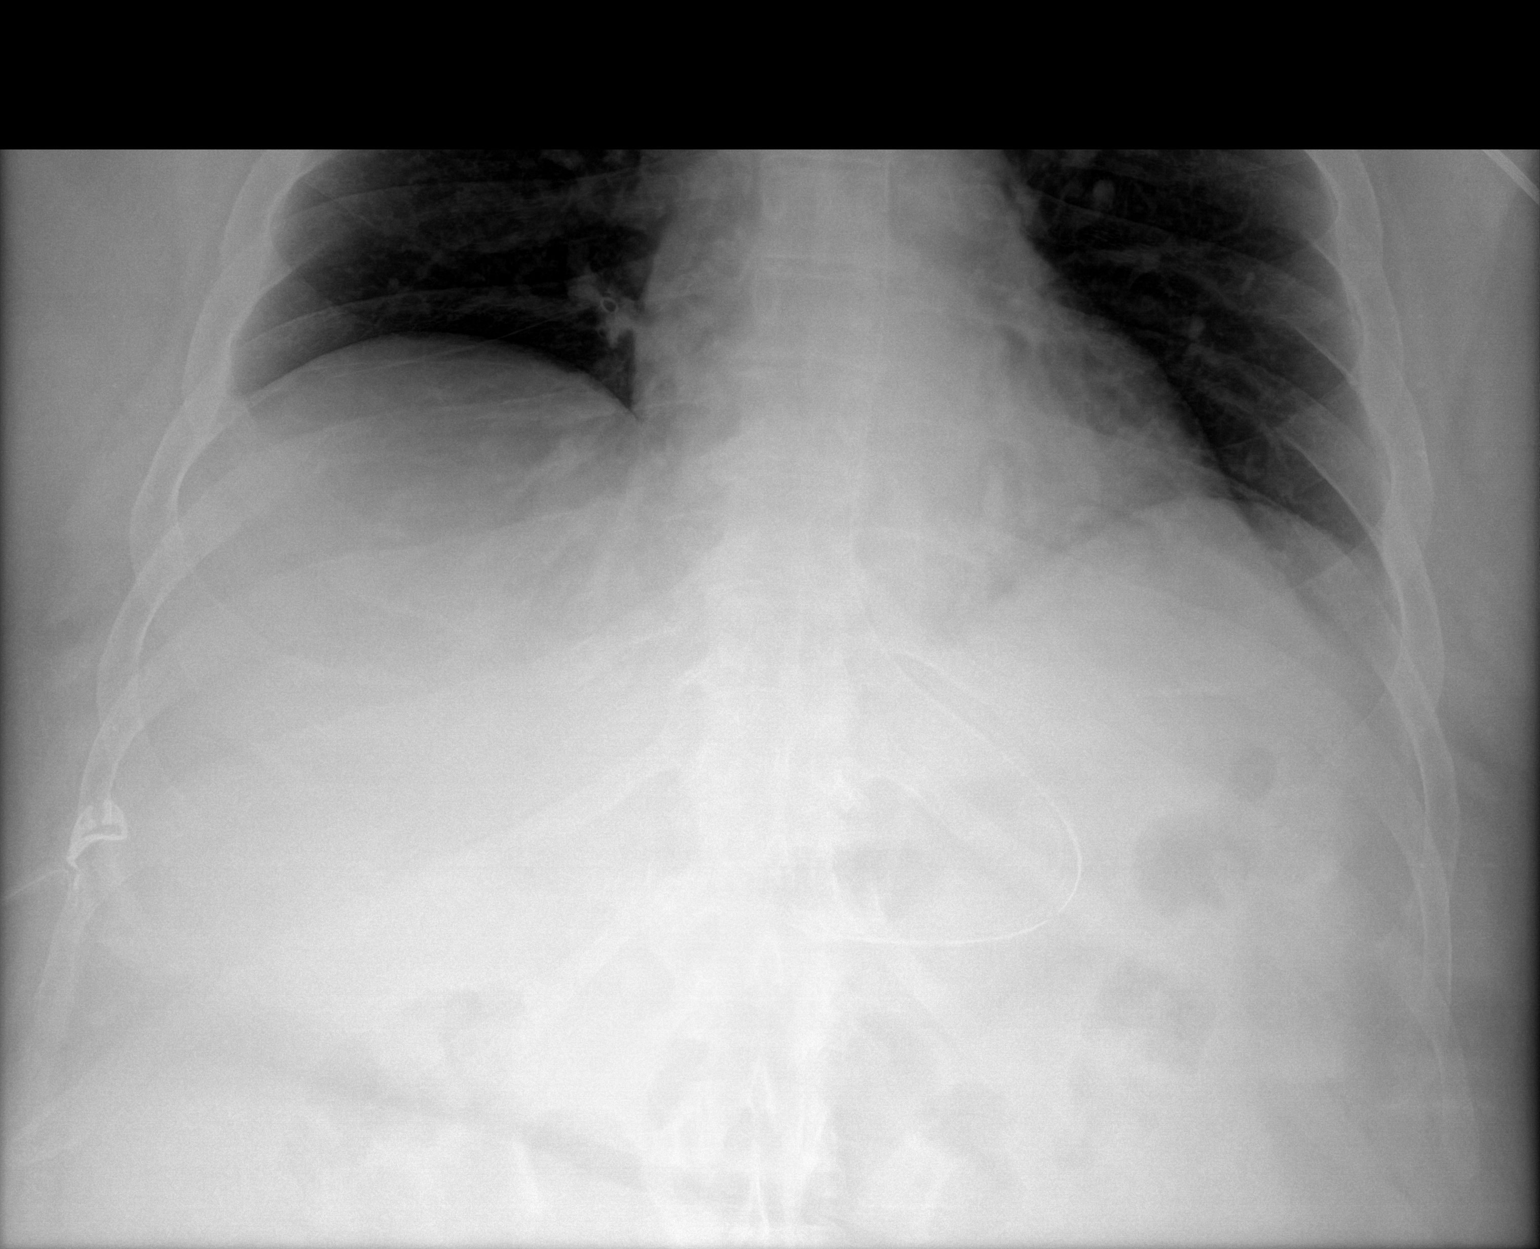

[1 of 1 positions shown; findings below may reference images not displayed]

FINDINGS: Orogastric tube extends into the abdomen. The tip is in the distal
gastric body region. Lower lungs are clear. Small amount of bowel
gas in the abdomen.
IMPRESSION: Orogastric tube in the distal gastric body region.

## 2021-06-22 IMAGING — DX DG CHEST 1V PORT
1 series · 2 of 2 positions shown · non-contrast
Comparison: None.

CLINICAL DATA: Dyspnea.

EXAM:
PORTABLE CHEST 1 VIEW

[Series 1: chest ap · 0.14mm/px · 2 of 2 slices shown]
[im 1/2]
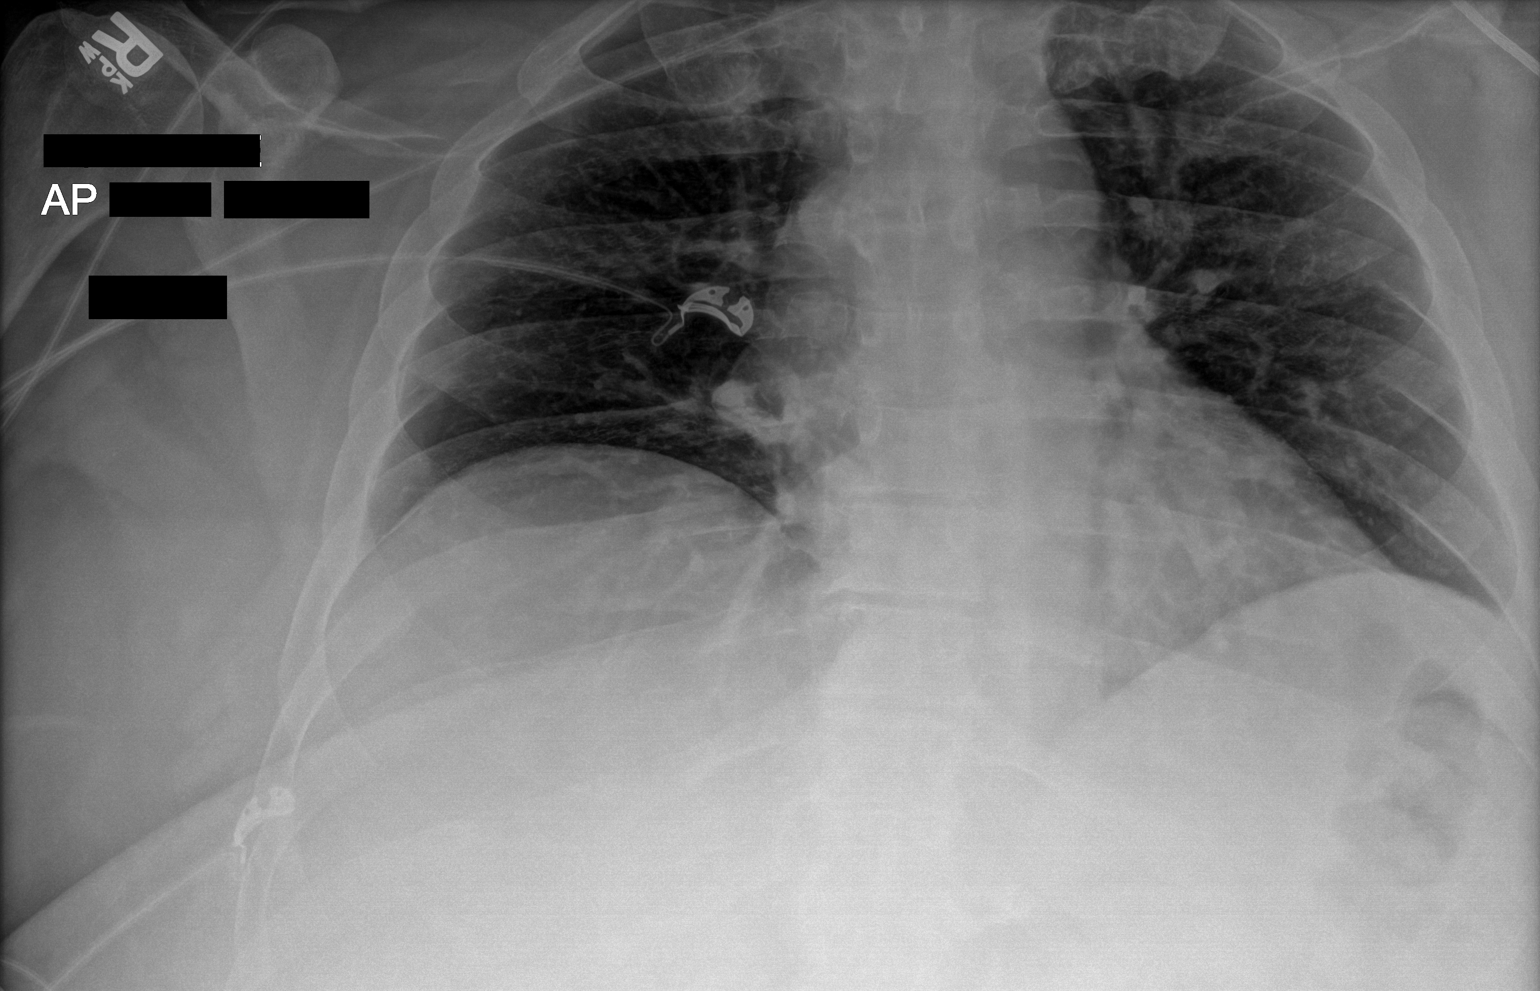
[im 2/2]
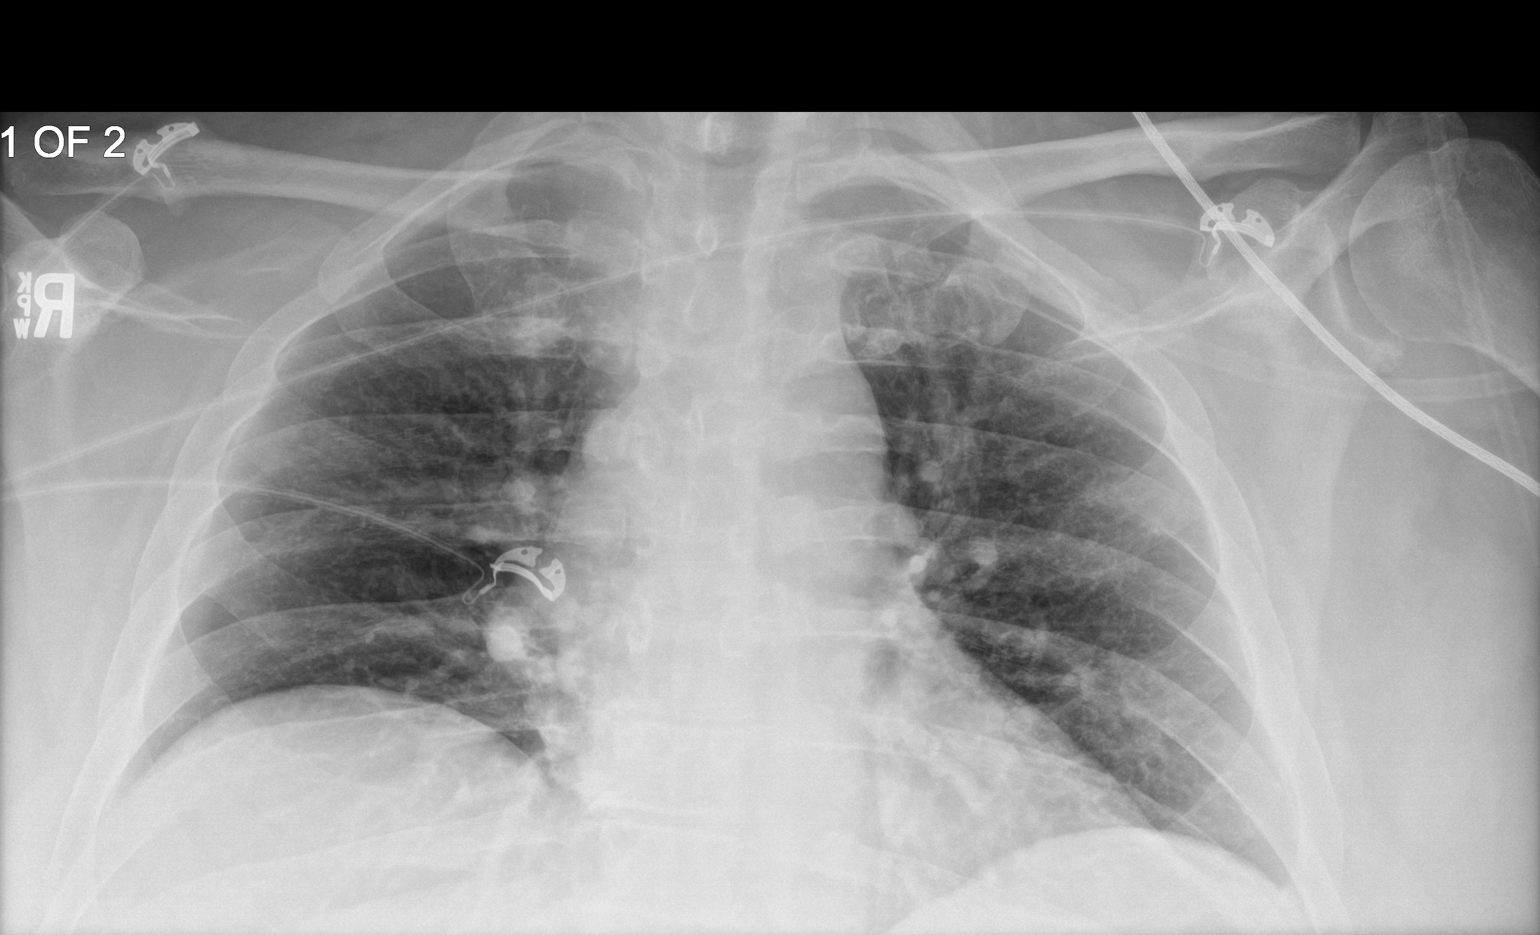

[2 of 2 positions shown; findings below may reference images not displayed]

FINDINGS: The heart size and mediastinal contours are within normal limits.
Both lungs are clear. Hypoinflation of the lungs is noted. The
visualized skeletal structures are unremarkable.
IMPRESSION: No active disease.  Hypoinflation of the lungs.

## 2021-06-22 IMAGING — DX DG CHEST 1V PORT
1 series · 1 of 1 positions shown · non-contrast
Comparison: Chest x-ray earlier, same date.

CLINICAL DATA: Intubated.

EXAM:
PORTABLE CHEST 1 VIEW

[chest ap]
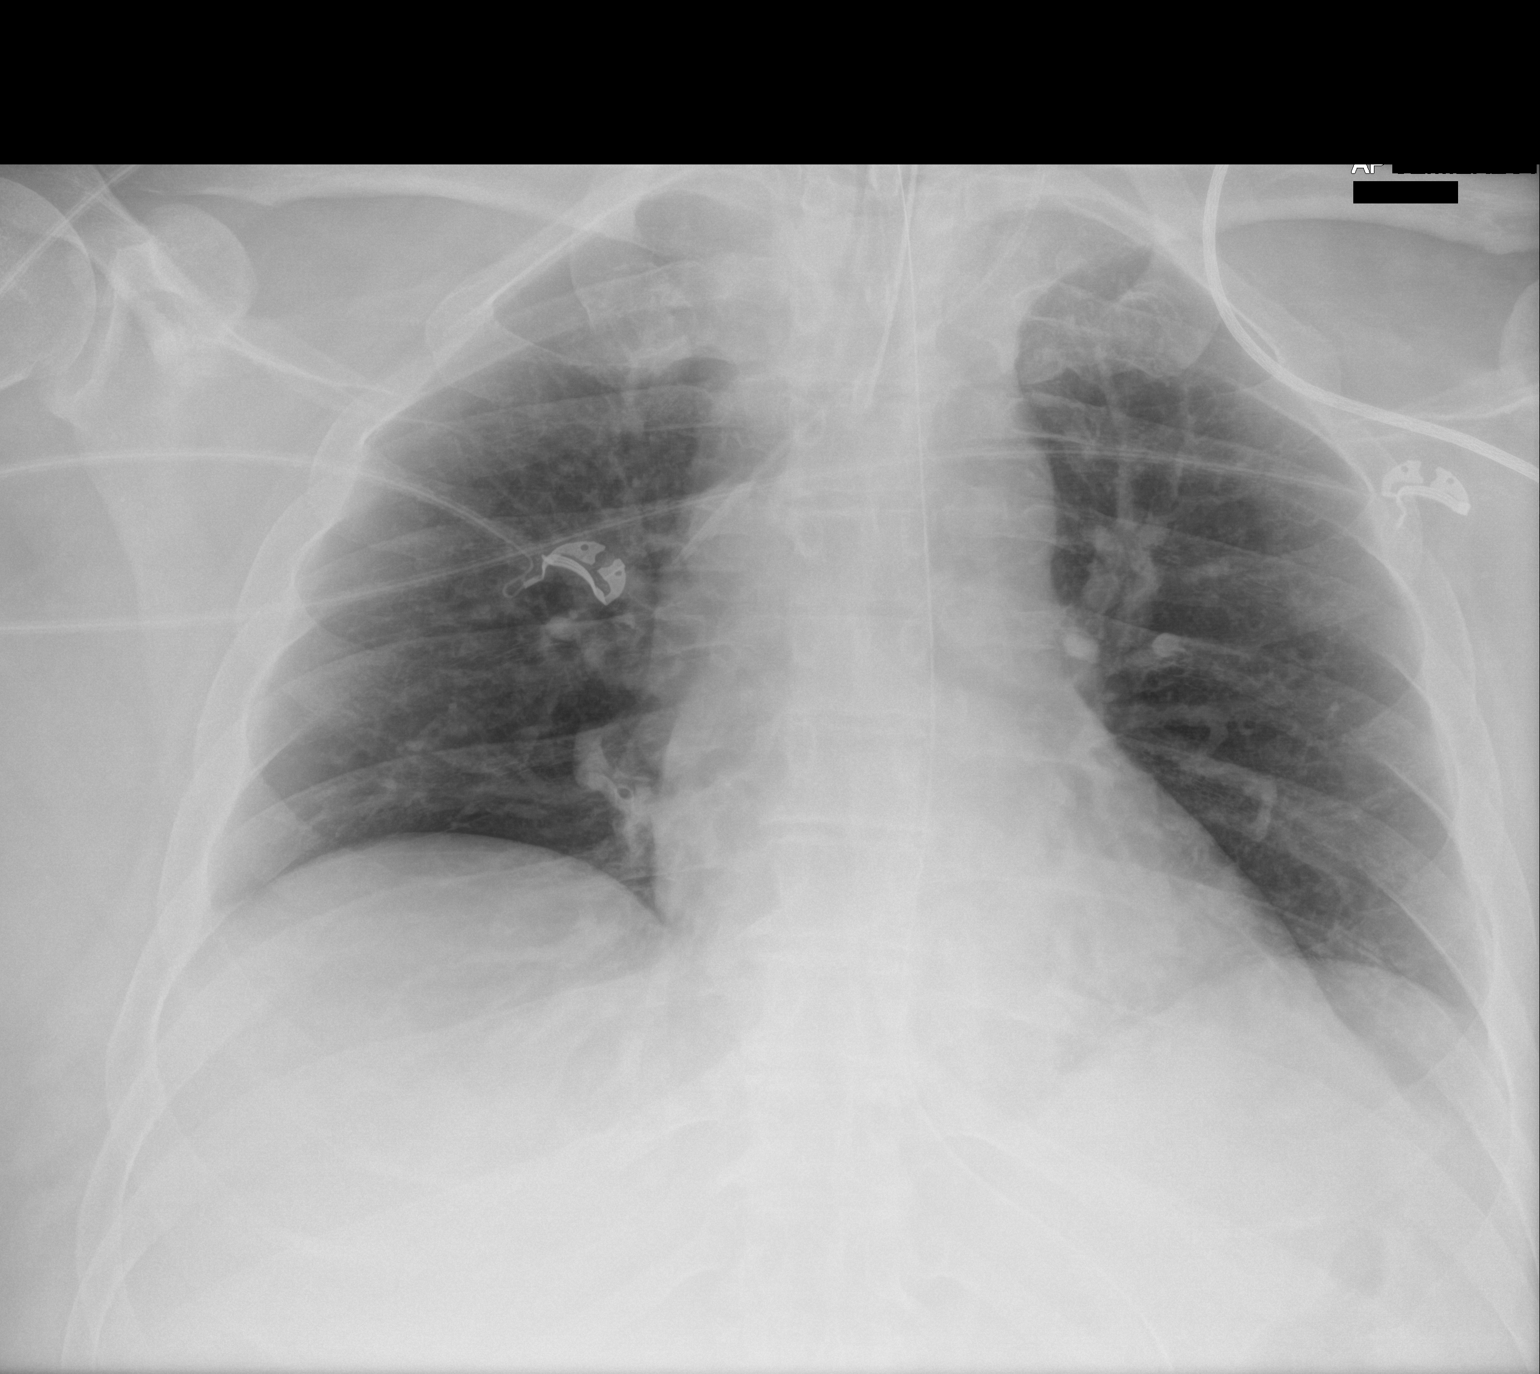

[1 of 1 positions shown; findings below may reference images not displayed]

FINDINGS: The endotracheal tube is 4 cm above the carina. The NG tube is
coursing down the esophagus and into the stomach. The left IJ
central venous catheter tip is in the mid SVC.

The cardiac silhouette, mediastinal and hilar contours are within
normal limits. The lungs are clear.
IMPRESSION: 1. Support apparatus in good position without complicating features.
2. No acute cardiopulmonary findings.

## 2021-06-22 MED ORDER — VITAL HIGH PROTEIN PO LIQD
1000.0000 mL | ORAL | Status: DC
  Administered 2021-06-22 – 2021-06-23 (×3): 1000 mL

## 2021-06-22 MED ORDER — NOREPINEPHRINE 4 MG/250ML-% IV SOLN
INTRAVENOUS | Status: AC
  Filled 2021-06-22: qty 250

## 2021-06-22 MED ORDER — HEPARIN (PORCINE) 25000 UT/250ML-% IV SOLN
1750.0000 [IU]/h | INTRAVENOUS | Status: DC
  Administered 2021-06-22 – 2021-06-23 (×3): 2000 [IU]/h via INTRAVENOUS
  Administered 2021-06-24: 1800 [IU]/h via INTRAVENOUS
  Administered 2021-06-25 (×2): 1750 [IU]/h via INTRAVENOUS
  Filled 2021-06-22 (×6): qty 250

## 2021-06-22 MED ORDER — SODIUM CHLORIDE 0.9 % IV SOLN
3.0000 g | Freq: Four times a day (QID) | INTRAVENOUS | Status: DC
  Administered 2021-06-22 (×2): 3 g via INTRAVENOUS
  Filled 2021-06-22 (×3): qty 8

## 2021-06-22 MED ORDER — NOREPINEPHRINE 4 MG/250ML-% IV SOLN: 0.0000 ug/min | INTRAVENOUS | Status: DC

## 2021-06-22 MED ORDER — ETOMIDATE 2 MG/ML IV SOLN: 20.0000 mg | Freq: Once | INTRAVENOUS | Status: AC

## 2021-06-22 MED ORDER — PROPOFOL 1000 MG/100ML IV EMUL
5.0000 ug/kg/min | INTRAVENOUS | Status: DC
  Administered 2021-06-22: 5 ug/kg/min via INTRAVENOUS
  Administered 2021-06-22: 20 ug/kg/min via INTRAVENOUS
  Filled 2021-06-22 (×2): qty 100

## 2021-06-22 MED ORDER — PANTOPRAZOLE 2 MG/ML SUSPENSION
40.0000 mg | Freq: Every day | ORAL | Status: DC
  Administered 2021-06-22 – 2021-07-05 (×14): 40 mg
  Filled 2021-06-22 (×14): qty 20

## 2021-06-22 MED ORDER — ROCURONIUM BROMIDE 10 MG/ML (PF) SYRINGE
PREFILLED_SYRINGE | INTRAVENOUS | Status: AC
  Administered 2021-06-22: 50 mg via INTRAVENOUS
  Filled 2021-06-22: qty 10

## 2021-06-22 MED ORDER — LACTULOSE 10 GM/15ML PO SOLN
20.0000 g | Freq: Three times a day (TID) | ORAL | Status: DC
  Administered 2021-06-22 – 2021-06-25 (×9): 20 g
  Filled 2021-06-22 (×9): qty 30

## 2021-06-22 MED ORDER — SENNOSIDES-DOCUSATE SODIUM 8.6-50 MG PO TABS: 1.0000 | ORAL_TABLET | Freq: Every day | ORAL | Status: DC

## 2021-06-22 MED ORDER — SODIUM CHLORIDE 0.9 % IV SOLN
250.0000 mL | INTRAVENOUS | Status: DC
  Administered 2021-06-22 – 2021-06-26 (×2): 250 mL via INTRAVENOUS

## 2021-06-22 MED ORDER — HEPARIN BOLUS VIA INFUSION
7500.0000 [IU] | Freq: Once | INTRAVENOUS | Status: AC
  Administered 2021-06-22: 7500 [IU] via INTRAVENOUS
  Filled 2021-06-22: qty 7500

## 2021-06-22 MED ORDER — ETOMIDATE 2 MG/ML IV SOLN
INTRAVENOUS | Status: AC
  Administered 2021-06-22: 20 mg via INTRAVENOUS
  Filled 2021-06-22: qty 20

## 2021-06-22 MED ORDER — THIAMINE HCL 100 MG/ML IJ SOLN
250.0000 mg | INTRAVENOUS | Status: AC
  Administered 2021-06-25 – 2021-06-26 (×2): 250 mg via INTRAVENOUS
  Filled 2021-06-22 (×2): qty 2.5

## 2021-06-22 MED ORDER — FUROSEMIDE 10 MG/ML IJ SOLN
40.0000 mg | Freq: Once | INTRAMUSCULAR | Status: AC
  Administered 2021-06-22: 40 mg via INTRAVENOUS
  Filled 2021-06-22: qty 4

## 2021-06-22 MED ORDER — FENTANYL CITRATE PF 50 MCG/ML IJ SOSY: 50.0000 ug | PREFILLED_SYRINGE | Freq: Once | INTRAMUSCULAR | Status: AC

## 2021-06-22 MED ORDER — FENTANYL CITRATE PF 50 MCG/ML IJ SOSY
PREFILLED_SYRINGE | INTRAMUSCULAR | Status: AC
  Administered 2021-06-22: 50 ug via INTRAVENOUS
  Filled 2021-06-22: qty 1

## 2021-06-22 MED ORDER — FENTANYL 2500MCG IN NS 250ML (10MCG/ML) PREMIX INFUSION
0.0000 ug/h | INTRAVENOUS | Status: DC
  Administered 2021-06-22: 25 ug/h via INTRAVENOUS
  Administered 2021-06-23: 100 ug/h via INTRAVENOUS
  Administered 2021-06-24: 90 ug/h via INTRAVENOUS
  Administered 2021-06-25: 80 ug/h via INTRAVENOUS
  Filled 2021-06-22 (×4): qty 250

## 2021-06-22 MED ORDER — ROCURONIUM BROMIDE 50 MG/5ML IV SOLN
50.0000 mg | Freq: Once | INTRAVENOUS | Status: AC
  Filled 2021-06-22: qty 5

## 2021-06-22 MED ORDER — SODIUM CHLORIDE 0.9 % IV SOLN
2.0000 g | Freq: Three times a day (TID) | INTRAVENOUS | Status: DC
  Administered 2021-06-22 – 2021-06-23 (×2): 2 g via INTRAVENOUS
  Filled 2021-06-22 (×3): qty 12.5

## 2021-06-22 MED ORDER — DIAZEPAM 2 MG PO TABS
1.0000 mg | ORAL_TABLET | Freq: Four times a day (QID) | ORAL | Status: DC
  Administered 2021-06-22 – 2021-06-23 (×3): 1 mg
  Filled 2021-06-22 (×3): qty 1

## 2021-06-22 MED ORDER — LACTATED RINGERS IV BOLUS
500.0000 mL | Freq: Once | INTRAVENOUS | Status: AC
Start: 2021-06-22 — End: 2021-06-22
  Administered 2021-06-22: 500 mL via INTRAVENOUS

## 2021-06-22 MED ORDER — MIDAZOLAM HCL 2 MG/2ML IJ SOLN
INTRAMUSCULAR | Status: AC
  Administered 2021-06-22: 2 mg via INTRAVENOUS
  Filled 2021-06-22: qty 2

## 2021-06-22 MED ORDER — MIDAZOLAM HCL 2 MG/2ML IJ SOLN
2.0000 mg | INTRAMUSCULAR | Status: DC | PRN
  Administered 2021-06-22: 2 mg via INTRAVENOUS
  Filled 2021-06-22: qty 2

## 2021-06-22 MED ORDER — FENTANYL CITRATE (PF) 100 MCG/2ML IJ SOLN
100.0000 ug | Freq: Once | INTRAMUSCULAR | Status: AC
  Administered 2021-06-22: 100 ug via INTRAVENOUS
  Filled 2021-06-22: qty 2

## 2021-06-22 MED ORDER — FENTANYL CITRATE PF 50 MCG/ML IJ SOSY
PREFILLED_SYRINGE | INTRAMUSCULAR | Status: AC
  Filled 2021-06-22: qty 1

## 2021-06-22 MED ORDER — SENNOSIDES-DOCUSATE SODIUM 8.6-50 MG PO TABS
1.0000 | ORAL_TABLET | Freq: Every day | ORAL | Status: DC
  Administered 2021-06-22 – 2021-06-24 (×3): 1
  Filled 2021-06-22 (×3): qty 1

## 2021-06-22 MED ORDER — NOREPINEPHRINE 4 MG/250ML-% IV SOLN
2.0000 ug/min | INTRAVENOUS | Status: DC
  Administered 2021-06-22: 2 ug/min via INTRAVENOUS

## 2021-06-22 MED ORDER — METRONIDAZOLE 500 MG/100ML IV SOLN
500.0000 mg | Freq: Two times a day (BID) | INTRAVENOUS | Status: DC
  Administered 2021-06-22 – 2021-06-25 (×6): 500 mg via INTRAVENOUS
  Filled 2021-06-22 (×7): qty 100

## 2021-06-22 MED ORDER — MIDAZOLAM HCL 2 MG/2ML IJ SOLN: 2.0000 mg | INTRAMUSCULAR | Status: DC | PRN

## 2021-06-22 MED ORDER — MIDAZOLAM-SODIUM CHLORIDE 100-0.9 MG/100ML-% IV SOLN
0.0000 mg/h | INTRAVENOUS | Status: DC
  Administered 2021-06-22 – 2021-06-23 (×2): 2 mg/h via INTRAVENOUS
  Administered 2021-06-25: 3 mg/h via INTRAVENOUS
  Filled 2021-06-22 (×3): qty 100

## 2021-06-22 MED ORDER — PROSOURCE TF PO LIQD
45.0000 mL | Freq: Three times a day (TID) | ORAL | Status: DC
  Administered 2021-06-22 – 2021-06-23 (×2): 45 mL
  Filled 2021-06-22 (×3): qty 45

## 2021-06-22 MED ORDER — VASOPRESSIN 20 UNITS/100 ML INFUSION FOR SHOCK
0.0000 [IU]/min | INTRAVENOUS | Status: DC
  Administered 2021-06-22 – 2021-06-25 (×8): 0.03 [IU]/min via INTRAVENOUS
  Filled 2021-06-22 (×9): qty 100

## 2021-06-22 MED ORDER — THIAMINE HCL 100 MG/ML IJ SOLN
100.0000 mg | INTRAMUSCULAR | Status: DC
  Administered 2021-06-27 – 2021-07-07 (×11): 100 mg via INTRAVENOUS
  Filled 2021-06-22 (×11): qty 2

## 2021-06-22 MED ORDER — MIDAZOLAM BOLUS VIA INFUSION: 0.0000 mg | INTRAVENOUS | Status: DC | PRN

## 2021-06-22 MED ORDER — HYDROCORTISONE SOD SUC (PF) 100 MG IJ SOLR
100.0000 mg | Freq: Three times a day (TID) | INTRAMUSCULAR | Status: DC
  Administered 2021-06-22 – 2021-06-26 (×11): 100 mg via INTRAVENOUS
  Filled 2021-06-22 (×12): qty 2

## 2021-06-22 MED ORDER — FENTANYL BOLUS VIA INFUSION
50.0000 ug | INTRAVENOUS | Status: DC | PRN
  Administered 2021-06-24: 50 ug via INTRAVENOUS

## 2021-06-22 MED ORDER — THIAMINE HCL 100 MG/ML IJ SOLN
500.0000 mg | Freq: Three times a day (TID) | INTRAVENOUS | Status: AC
  Administered 2021-06-22 – 2021-06-24 (×8): 500 mg via INTRAVENOUS
  Filled 2021-06-22 (×9): qty 5

## 2021-06-22 MED ORDER — VANCOMYCIN HCL 1750 MG/350ML IV SOLN: 1750.0000 mg | INTRAVENOUS | Status: DC

## 2021-06-22 MED ORDER — SODIUM CHLORIDE 0.9 % IV SOLN
0.5000 ug/min | INTRAVENOUS | Status: DC
  Administered 2021-06-22: 0.5 ug/min via INTRAVENOUS
  Filled 2021-06-22: qty 10

## 2021-06-22 MED ORDER — VANCOMYCIN HCL 2000 MG/400ML IV SOLN
2000.0000 mg | Freq: Once | INTRAVENOUS | Status: AC
  Administered 2021-06-22: 2000 mg via INTRAVENOUS
  Filled 2021-06-22: qty 400

## 2021-06-22 MED ORDER — LACTATED RINGERS IV BOLUS
500.0000 mL | Freq: Once | INTRAVENOUS | Status: AC
  Administered 2021-06-22: 500 mL via INTRAVENOUS

## 2021-06-22 MED ORDER — LACTULOSE 10 GM/15ML PO SOLN
20.0000 g | Freq: Three times a day (TID) | ORAL | Status: DC
  Administered 2021-06-22: 20 g via ORAL
  Filled 2021-06-22: qty 30

## 2021-06-22 MED ORDER — FREE WATER
30.0000 mL | Status: DC
  Administered 2021-06-22 – 2021-07-05 (×75): 30 mL

## 2021-06-22 MED ORDER — NOREPINEPHRINE 16 MG/250ML-% IV SOLN
0.0000 ug/min | INTRAVENOUS | Status: DC
  Administered 2021-06-22: 10 ug/min via INTRAVENOUS
  Administered 2021-06-23 (×2): 40 ug/min via INTRAVENOUS
  Administered 2021-06-23: 22 ug/min via INTRAVENOUS
  Administered 2021-06-24: 20 ug/min via INTRAVENOUS
  Administered 2021-06-25: 14.5 ug/min via INTRAVENOUS
  Administered 2021-06-26: 6 ug/min via INTRAVENOUS
  Filled 2021-06-22 (×7): qty 250

## 2021-06-22 NOTE — Progress Notes (Signed)
PROGRESS NOTE    Robert Coffey  QGB:[id number] DOB: 1970-04-23 DOA: 07/03/2021 PCP: Renee Rival, NP   Assessment & Plan:   Principal Problem:   Acute metabolic encephalopathy Active Problems:   Alcohol withdrawal ([place])   Hyponatremia   Acute kidney injury ([place])   Transaminitis  Assessment and Plan: Acute hypoxic respiratory failure: increased work or breathing & decrease in SaO2. Pt was intubated & ventilated by ICU. ICU will take over care today   Acute metabolic encephalopathy: not improving. Likely secondary to severe hyponatremia & alcohol w/drawal    Alcohol withdrawal: continue on CIWA protocol. Continue on clonidine. Needs alcohol cessation counseling but too lethargic currenty    Hyponatremia: likely secondary to alcohol abuse. Labile. Continue on 3% NaCl. Continue w/ Na level q4H. Nephro following and recs apprec    AKI: likely secondary to dehydration & poor oral intake.Cr is trending down from day prior    Transaminitis: likely secondary to alcohol abuse. Will continue to monitor   Hyperbilirubinemia: cause of scleral icterus. Secondary to alcohol abuse   Morbid obesity: BMI 40. Complicates overall care & prognosis   Thrombocytopenia: likely secondary to alcohol abuse. Will continue to monitor   Leukocytosis: resolved   DVT prophylaxis: lovenox  Code Status:  full  Family Communication: discussed pt's care w/ pt's wife, [name], and answered her questions  Disposition Plan: unclear   Level of care: Stepdown  Status is: Inpatient Remains inpatient appropriate because: severity of illness    Consultants:  Nephro  ICU  Procedures:   Antimicrobials:    Subjective: Pt is obtunded   Objective: Vitals:   06/22/21 0300 06/22/21 0400 06/22/21 0500 06/22/21 0600  BP: (!) 159/75 (!) 154/81 (!) 152/53 (!) 154/73  Pulse: (!) 121 (!) 120 (!) 117 (!) 121  Resp: 18 16 18 17   Temp:  99.1 F (37.3 C)    TempSrc:  Oral    SpO2: 97% 98% 95% 96%   Weight:      Height:        Intake/Output Summary (Last 24 hours) at 06/22/2021 0811 Last data filed at 06/21/2021 1800 Gross per 24 hour  Intake 345.68 ml  Output 400 ml  Net -54.32 ml   Filed Weights   06/12/2021 0549 06/29/2021 2005  Weight: 92.1 kg (!) 145.1 kg    Examination:  General exam: Appears obtunded. Scleral icterus  Respiratory system: decreased breath sounds b/l Cardiovascular system: S1/S2+. No rubs or clicks  Gastrointestinal system: Abd is soft, NT, obese & hypoactive bowel sounds Central nervous system:  obtunded  Psychiatry: judgement and insight appears poor     Data Reviewed: I have personally reviewed following labs and imaging studies  CBC: Recent Labs  Lab 06/07/2021 0629 06/21/21 0331 06/22/21 0408  WBC 11.0* 11.1* 10.2  NEUTROABS 8.1*  --   --   HGB 15.3 13.7 14.2  HCT 41.7 38.2* 40.8  MCV 102.2* 102.4* 107.4*  PLT 129* 107* 975*   Basic Metabolic Panel: Recent Labs  Lab 06/05/2021 0629 06/21/2021 1015 06/15/2021 1207 06/19/2021 1408 06/21/21 0331 06/21/21 0920 06/21/21 1555 06/21/21 1940 06/21/21 2337 06/22/21 0408 06/22/21 0731  NA 109*   < > 108*   < > 113*   < > 118* 121* 121* 123* 123*  K 4.5  --   --   --  4.1  --   --   --   --  4.1  --   CL 69*  --   --   --  79*  --   --   --   --  90*  --   CO2 23  --   --   --  21*  --   --   --   --  24  --   GLUCOSE 108*  --   --   --  86  --   --   --   --  89  --   BUN 8  --   --   --  15  --   --   --   --  27*  --   CREATININE 1.62*  --   --   --  2.08*  --   --   --   --  1.96*  --   CALCIUM 8.5*  --   --   --  8.1*  --   --   --   --  8.3*  --   MG  --   --  1.7  --   --   --   --   --   --  2.4  --    < > = values in this interval not displayed.   GFR: Estimated Creatinine Clearance: 70.9 mL/min (A) (by C-G formula based on SCr of 1.96 mg/dL (H)). Liver Function Tests: Recent Labs  Lab 06/06/2021 0629  AST 256*  ALT 119*  ALKPHOS 116  BILITOT 5.0*  PROT 6.8  ALBUMIN 3.1*    Recent Labs  Lab 06/17/2021 0629  LIPASE 86*   No results for input(s): AMMONIA in the last 168 hours. Coagulation Profile: No results for input(s): INR, PROTIME in the last 168 hours. Cardiac Enzymes: No results for input(s): CKTOTAL, CKMB, CKMBINDEX, TROPONINI in the last 168 hours. BNP (last 3 results) No results for input(s): PROBNP in the last 8760 hours. HbA1C: No results for input(s): HGBA1C in the last 72 hours. CBG: Recent Labs  Lab 06/29/2021 2004  GLUCAP 99   Lipid Profile: No results for input(s): CHOL, HDL, LDLCALC, TRIG, CHOLHDL, LDLDIRECT in the last 72 hours. Thyroid Function Tests: Recent Labs    07/03/2021 0629  TSH 2.119   Anemia Panel: No results for input(s): VITAMINB12, FOLATE, FERRITIN, TIBC, IRON, RETICCTPCT in the last 72 hours. Sepsis Labs: No results for input(s): PROCALCITON, LATICACIDVEN in the last 168 hours.  Recent Results (from the past 240 hour(s))  Resp Panel by RT-PCR (Flu A&B, Covid) Nasopharyngeal Swab     Status: None   Collection Time: 06/28/2021  4:08 PM   Specimen: Nasopharyngeal Swab; Nasopharyngeal(NP) swabs in vial transport medium  Result Value Ref Range Status   SARS Coronavirus 2 by RT PCR NEGATIVE NEGATIVE Final    Comment: (NOTE) SARS-CoV-2 target nucleic acids are NOT DETECTED.  The SARS-CoV-2 RNA is generally detectable in upper respiratory specimens during the acute phase of infection. The lowest concentration of SARS-CoV-2 viral copies this assay can detect is 138 copies/mL. A negative result does not preclude SARS-Cov-2 infection and should not be used as the sole basis for treatment or other patient management decisions. A negative result may occur with  improper specimen collection/handling, submission of specimen other than nasopharyngeal swab, presence of viral mutation(s) within the areas targeted by this assay, and inadequate number of viral copies(<138 copies/mL). A negative result must be combined  with clinical observations, patient history, and epidemiological information. The expected result is Negative.  Fact Sheet for Patients:  EntrepreneurPulse.com.au  Fact Sheet for Healthcare Providers:  IncredibleEmployment.be  This  test is no t yet approved or cleared by the [place] and  has been authorized for detection and/or diagnosis of SARS-CoV-2 by FDA under an Emergency Use Authorization (EUA). This EUA will remain  in effect (meaning this test can be used) for the duration of the COVID-19 declaration under Section 564(b)(1) of the Act, 21 U.S.C.section 360bbb-3(b)(1), unless the authorization is terminated  or revoked sooner.       Influenza A by PCR NEGATIVE NEGATIVE Final   Influenza B by PCR NEGATIVE NEGATIVE Final    Comment: (NOTE) The Xpert Xpress SARS-CoV-2/FLU/RSV plus assay is intended as an aid in the diagnosis of influenza from Nasopharyngeal swab specimens and should not be used as a sole basis for treatment. Nasal washings and aspirates are unacceptable for Xpert Xpress SARS-CoV-2/FLU/RSV testing.  Fact Sheet for Patients: EntrepreneurPulse.com.au  Fact Sheet for Healthcare Providers: IncredibleEmployment.be  This test is not yet approved or cleared by the [place] FDA and has been authorized for detection and/or diagnosis of SARS-CoV-2 by FDA under an Emergency Use Authorization (EUA). This EUA will remain in effect (meaning this test can be used) for the duration of the COVID-19 declaration under Section 564(b)(1) of the Act, 21 U.S.C. section 360bbb-3(b)(1), unless the authorization is terminated or revoked.  Performed at [facility], [address] [place], [place] [address]   MRSA Next Gen by PCR, Nasal     Status: None   Collection Time: 06/11/2021  8:23 PM   Specimen: Nasal Mucosa; Nasal Swab  Result Value Ref Range Status   MRSA by PCR Next Gen  NOT DETECTED NOT DETECTED Final    Comment: (NOTE) The GeneXpert MRSA Assay (FDA approved for NASAL specimens only), is one component of a comprehensive MRSA colonization surveillance program. It is not intended to diagnose MRSA infection nor to guide or monitor treatment for MRSA infections. Test performance is not FDA approved in patients less than 22 years old. Performed at [facility], [address], [place], [place] [address]          Radiology Studies: CT HEAD WO CONTRAST (5MM)  Result Date: 06/08/2021 CLINICAL DATA:  Altered mental status. Weakness for 2 weeks. Urinary incontinence. Confusion. EXAM: CT HEAD WITHOUT CONTRAST TECHNIQUE: Contiguous axial images were obtained from the base of the skull through the vertex without intravenous contrast. RADIATION DOSE REDUCTION: This exam was performed according to the departmental dose-optimization program which includes automated exposure control, adjustment of the mA and/or kV according to patient size and/or use of iterative reconstruction technique. COMPARISON:  None Available. FINDINGS: Brain: Advanced for age cerebral atrophy especially involving the frontal lobes. Otherwise, the brainstem, cerebellum, cerebral peduncles, thalamus, basal ganglia, basilar cisterns, and ventricular system appear within normal limits. No intracranial hemorrhage, mass lesion, or acute CVA. Vascular: There is atherosclerotic calcification of the cavernous carotid arteries bilaterally. Skull: Unremarkable Sinuses/Orbits: Chronic bilateral maxillary sinusitis. Small bilateral mastoid effusions. Fluid or debris in the left sinus tympani on images 16-17 of series 2. Other: No supplemental non-categorized findings. IMPRESSION: 1. Fluid or debris in the left sinus tympani/middle ear, cannot exclude otitis media. There small bilateral mastoid effusions and chronic bilateral maxillary sinusitis. 2. Advanced for age cerebral atrophy. 3. Atherosclerosis.  Electronically Signed   By: Van Clines M.D.   On: 06/17/2021 09:01   CT ABDOMEN PELVIS W CONTRAST  Result Date: 06/14/2021 CLINICAL DATA:  Acute generalized abdominal pain. EXAM: CT ABDOMEN AND PELVIS WITH CONTRAST TECHNIQUE: Multidetector CT imaging of the  abdomen and pelvis was performed using the standard protocol following bolus administration of intravenous contrast. RADIATION DOSE REDUCTION: This exam was performed according to the departmental dose-optimization program which includes automated exposure control, adjustment of the mA and/or kV according to patient size and/or use of iterative reconstruction technique. CONTRAST:  146mL OMNIPAQUE IOHEXOL 300 MG/ML  SOLN COMPARISON:  None Available. FINDINGS: Lower chest: No acute abnormality. Hepatobiliary: No gallstones or biliary dilatation is noted. Hepatic steatosis is noted. Pancreas: Unremarkable. No pancreatic ductal dilatation or surrounding inflammatory changes. Spleen: Normal in size without focal abnormality. Adrenals/Urinary Tract: 2.6 cm enhancing left adrenal nodule is noted. Right adrenal gland appears normal. No hydronephrosis or renal obstruction is noted. No renal or ureteral calculi are noted. Urinary bladder is unremarkable. Stomach/Bowel: Stomach is within normal limits. Appendix appears normal. No evidence of bowel wall thickening, distention, or inflammatory changes. Vascular/Lymphatic: Aortic atherosclerosis. No enlarged abdominal or pelvic lymph nodes. Reproductive: Prostate is unremarkable. Other: No abdominal wall hernia or abnormality. No abdominopelvic ascites. Musculoskeletal: No acute or significant osseous findings. IMPRESSION: Hepatic steatosis. 2.6 cm enhancing left adrenal nodule is noted. When the patient is clinically stable and able to follow directions and hold their breath (preferably as an outpatient) further evaluation with dedicated abdominal MRI should be considered. Aortic Atherosclerosis (ICD10-I70.0).  Electronically Signed   By: Marijo Conception M.D.   On: 06/18/2021 09:01        Scheduled Meds:  Chlorhexidine Gluconate Cloth  6 each Topical Q0600   cloNIDine  0.1 mg Oral BID   enoxaparin (LOVENOX) injection  40 mg Subcutaneous J69C   folic acid  1 mg Intravenous Daily   LORazepam  0-4 mg Intravenous Q4H   Followed by   LORazepam  0-4 mg Intravenous Q8H   multivitamin with minerals  1 tablet Oral Daily   thiamine  100 mg Oral Daily   Or   thiamine  100 mg Intravenous Daily   Continuous Infusions:  sodium chloride (hypertonic) 30 mL/hr at 06/22/21 0444     LOS: 2 days    Time spent: 38 mins     Wyvonnia Dusky, MD [facility] Pager [phone]  If 7PM-7AM, please contact night-coverage  06/22/2021, 8:11 AM

## 2021-06-22 NOTE — Progress Notes (Signed)
[place] for IV heparin Indication: pulmonary embolus  Allergies  Allergen Reactions   Sulfa Antibiotics     Patient Measurements: Height: 6\' 3"  (190.5 cm) Weight: (!) 145.1 kg (319 lb 14.2 oz) IBW/kg (Calculated) : 84.5 Heparin Dosing Weight: 117.5 kg  Vital Signs: Temp: 103.5 F (39.7 C) (05/18 1900) Temp Source: Axillary (05/18 1200) BP: 82/69 (05/18 1815) Pulse Rate: 126 (05/18 1900)  Labs: Recent Labs    06/11/2021 0629 06/19/2021 0802 06/21/21 0331 06/22/21 0408 06/22/21 1857  HGB 15.3  --  13.7 14.2 14.0  HCT 41.7  --  38.2* 40.8 41.8  PLT 129*  --  107* 135* 132*  CREATININE 1.62*  --  2.08* 1.96*  --   TROPONINIHS 67* 65*  --   --   --     Estimated Creatinine Clearance: 70.9 mL/min (A) (by C-G formula based on SCr of 1.96 mg/dL (H)).   Medications:  Medications Prior to Admission  Medication Sig Dispense Refill Last Dose   atorvastatin (LIPITOR) 20 MG tablet Take 20 mg by mouth daily.   06/19/2021 at 1200   cyanocobalamin 1000 MCG tablet Take by mouth.   Past Month   hydrochlorothiazide (HYDRODIURIL) 25 MG tablet Take 25 mg by mouth daily.   06/19/2021   lisinopril (ZESTRIL) 40 MG tablet Take 40 mg by mouth daily.   06/19/2021 at 1800   rOPINIRole (REQUIP) 0.25 MG tablet Take 0.25 mg by mouth 3 (three) times daily.   06/19/2021 at pm   vitamin C (ASCORBIC ACID) 500 MG tablet Take 500 mg by mouth daily.      zolpidem (AMBIEN) 10 MG tablet Take 10 mg by mouth at bedtime as needed.   06/19/2021 at 2200   Scheduled:   Chlorhexidine Gluconate Cloth  6 each Topical Q0600   cloNIDine  0.1 mg Oral BID   diazepam  1 mg Per Tube Q6H   enoxaparin (LOVENOX) injection  40 mg Subcutaneous Q24H   feeding supplement (PROSource TF)  45 mL Per Tube TID   folic acid  1 mg Intravenous Daily   free water  30 mL Per Tube Q4H   hydrocortisone sod succinate (SOLU-CORTEF) inj  100 mg Intravenous Q8H   lactulose  20 g Per Tube TID    multivitamin with minerals  1 tablet Oral Daily   pantoprazole sodium  40 mg Per Tube Daily   senna-docusate  1 tablet Per Tube QHS   [START ON 06/27/2021] thiamine injection  100 mg Intravenous Q24H   Infusions:   sodium chloride Stopped (06/22/21 1037)   epinephrine     feeding supplement (VITAL HIGH PROTEIN) 1,000 mL (06/22/21 1729)   fentaNYL infusion INTRAVENOUS 75 mcg/hr (06/22/21 1900)   lactated ringers     metronidazole     midazolam     norepinephrine (LEVOPHED) Adult infusion 10 mcg/min (06/22/21 1914)   sodium chloride (hypertonic) 30 mL/hr at 06/22/21 1900   thiamine injection Stopped (06/22/21 1435)   Followed by   Derrill Memo ON 06/25/2021] thiamine injection     vasopressin 0.03 Units/min (06/22/21 1908)   PRN: acetaminophen **OR** acetaminophen, fentaNYL, midazolam, ondansetron **OR** ondansetron (ZOFRAN) IV Anti-infectives (From admission, onward)    Start     Dose/Rate Route Frequency Ordered Stop   06/22/21 2000  metroNIDAZOLE (FLAGYL) IVPB 500 mg        500 mg 100 mL/hr over 60 Minutes Intravenous Every 12 hours 06/22/21 1908     06/22/21 1200  Ampicillin-Sulbactam (UNASYN) 3 g in sodium chloride 0.9 % 100 mL IVPB  Status:  Discontinued        3 g 200 mL/hr over 30 Minutes Intravenous Every 6 hours 06/22/21 1007 06/22/21 1908       Assessment: Patient is a 51 y/o M with medical history including obesity, chronic back pain who presented to the ED 5/16 with weakness in setting of abdominal cramps, nausea, vomiting, and diarrhea. Intubated with hypercapnic respiratory failure. Intubated with hypercapnic respiratory failure. New concern for obstructive shock from potential PE. Decline in clinical status with tachycardia, hypotension, and FiO2 requirements of 80-90%.   Baseline labs stable.  Goal of Therapy:  Heparin level 0.3-0.7 units/ml Monitor platelets by anticoagulation protocol: Yes   Plan:  Give 7500 units bolus x 1 Start heparin infusion at 2000  units/hr Check anti-Xa level in 6 hours and daily while on heparin Continue to monitor H&H and platelets  Wynelle Cleveland, PharmD Pharmacy Resident  06/22/2021 7:45 PM

## 2021-06-22 NOTE — Progress Notes (Addendum)
   BRIEF PCCM NOTE  Pt was seen earlier by PCCM.   Please see progress note from prior for full assessment & plan.  Patient reassessed due to development of severe shock.  SUBJECTIVE / INTERVAL HISTORY :  -At approximately 60 RN reported that patient becoming more hypotensive with persistent systolic blood pressure in the 70s despite up titration of Levophed drip -Arterial line placed, systolic blood pressure ranging in the 60s -Patient becoming more febrile and tachycardic -Concern for worsening septic shock ~antibiotics broadened from Unasyn to cefepime, vancomycin, Flagyl -Given abrupt change in status along with patient's prolonged history of poor mobility over the past month ~have to consider obstructive shock from potential PE in the differential -Discussed with Dr. Mortimer Fries via telephone and Rufina Falco, APP nighttime provider ~in agreement with broadening of antibiotics, multiple multiple vasopressors, stress dose steroids and treating empirically for possible PE -No significant acidosis on repeat ABG: pH 7.35/PCO2 41/PO2 94/HCO3 22.6 -CBC, BMP, lactic acid, troponin still pending  OBJECTIVE :   Today's Vitals   06/22/21 1600 06/22/21 1615 06/22/21 1815 06/22/21 1900  BP: (!) 87/48 (!) 91/49 (!) 82/69   Pulse: (!) 118 (!) 120 (!) 124 (!) 126  Resp: 20 18 (!) 26 (!) 23  Temp:   (!) 103.1 F (39.5 C) (!) 103.5 F (39.7 C)  TempSrc:      SpO2: 93% 93% 92% 95%  Weight:      Height:      PainSc:       Body mass index is 39.98 kg/m.   ASSESSMENT / PLAN :   Shock: Septic +/- obstructive from potential PE +/- Cardiogenic -Continuous cardiac monitoring -Check EKG -Maintain MAP >65 -IV fluids ~given additional 500 cc LR bolus, will order additional 500 LR bolus -Follow CVP for further fluid resuscitation ~last CVP was 18 -Vasopressors as needed to maintain MAP goal ~in addition to Levophed, added vasopressin and epinephrine -Start stress dose steroids -Trend lactic acid  until normalized -Trend HS Troponin until peaked -Obtain echocardiogram  -Start heparin drip empirically for possible PE  -Unable to obtain CTA chest at this time due to hemodynamic instability and AKI, will obtain venous ultrasounds of the lower extremities -ABX broadened to Cefepime, Flagyl, and Vancomycin ~ previously on Unasyn for presumed aspiration as source of sepsis       Patient's wife updated at bedside regarding worsening shock and critical status.  All questions answered to her satisfaction.  Additional Critical Care Time: 30 minutes  Darel Hong, AGACNP-BC  Pulmonary & Critical Care Prefer epic messenger for cross cover needs If after hours, please call E-link

## 2021-06-22 NOTE — Procedures (Signed)
Central Venous Catheter Insertion Procedure Note [place] Presley [id number] 07-21-1970  Procedure: Insertion of Central Venous Catheter Indications: Assessment of intravascular volume, Drug and/or fluid administration, and Frequent blood sampling  Procedure Details Consent: Risks of procedure as well as the alternatives and risks of each were explained to the (patient/caregiver).  Consent for procedure obtained. Time Out: Verified patient identification, verified procedure, site/side was marked, verified correct patient position, special equipment/implants available, medications/allergies/relevent history reviewed, required imaging and test results available.  Performed  Maximum sterile technique was used including antiseptics, cap, gloves, gown, hand hygiene, mask, and sheet. Skin prep: Chlorhexidine; local anesthetic administered A antimicrobial bonded/coated triple lumen catheter was placed in the left internal jugular vein using the Seldinger technique.  Evaluation Blood flow good Complications: No apparent complications Patient did tolerate procedure well. Chest X-ray ordered to verify placement.  CXR: pending.   Line secured at the 20 cm mark. BIOPATCH applied to the insertion site.   Darel Hong, AGACNP-BC [facility] Prefer epic messenger for cross cover needs If after hours, please call E-link  Bradly Bienenstock 06/22/2021, 11:03 AM

## 2021-06-22 NOTE — Procedures (Addendum)
Endotracheal Intubation: Patient required placement of an artificial airway secondary to unresponsive state   Consent: Emergent.    Hand washing performed prior to starting the procedure.    Medications administered for sedation prior to procedure:  Fentanyl 100 mcg IV, 20 mg Etomidate IV, Rocuronium 50 mg IV     A time out procedure was called and correct patient, name, & ID confirmed. Needed supplies and equipment were assembled and checked to include ETT, 10 ml syringe, Glidescope, Mac and Miller blades, suction, oxygen and bag mask valve, end tidal CO2 monitor.    Patient was positioned to align the mouth and pharynx to facilitate visualization of the glottis.    Heart rate, SpO2 and blood pressure was continuously monitored during the procedure. Pre-oxygenation was conducted prior to intubation and endotracheal tube was placed through the vocal cords into the trachea.       The artificial airway was placed under direct visualization via glidescope route using a 8.0 ETT on the first attempt.   ETT was secured at 24 cm at lip.   Placement was confirmed by auscuitation of lungs with good breath sounds bilaterally and no stomach sounds.  Condensation was noted on endotracheal tube.   Pulse ox 98%. CO2 detector in place with appropriate color change.    Complications: None .        Chest radiograph ordered and pending.    Darel Hong, AGACNP-BC [facility] Prefer epic messenger for cross cover needs If after hours, please call E-link

## 2021-06-22 NOTE — Progress Notes (Signed)
[facility]  ROUNDING NOTE   Subjective:   According to primary care physician, due to respiratory decline patient has been intubated.  Also reports further decline in mentation, obtunded.  Patient now sedated.  Sedated, intubated Levo Fentanyl and propofol Foley catheter Tube feeds at 70 mL/h  Objective:  Vital signs in last 24 hours:  Temp:  [97.9 F (36.6 C)-99.8 F (37.7 C)] 99.6 F (37.6 C) (05/18 1200) Pulse Rate:  [108-121] 120 (05/18 1615) Resp:  [16-26] 18 (05/18 1615) BP: (81-159)/(41-90) 91/49 (05/18 1615) SpO2:  [86 %-98 %] 93 % (05/18 1615) FiO2 (%):  [80 %-100 %] 80 % (05/18 1607)  Weight change:  Filed Weights   06/15/2021 0549 06/09/2021 2005  Weight: 92.1 kg (!) 145.1 kg    Intake/Output: I/O last 3 completed shifts: In: 1653.7 [I.V.:1653.7] Out: 400 [Urine:400]   Intake/Output this shift:  Total I/O In: 154.1 [I.V.:154.1] Out: 400 [Urine:400]  Physical Exam: General: Sedated, critically ill-appearing  Head: Normocephalic, atraumatic  Eyes: Jaundiced  Lungs:  Intubated  Heart: Regular rate and rhythm  Abdomen:  Soft, nontender, obese  Extremities: Trace peripheral edema.  Neurologic: Sedated  Skin: No lesions  Access: None    Basic Metabolic Panel: Recent Labs  Lab 06/27/2021 0629 06/30/2021 1015 06/18/2021 1207 06/14/2021 1408 06/21/21 0331 06/21/21 0920 06/21/21 2337 06/22/21 0408 06/22/21 0731 06/22/21 1230 06/22/21 1531  NA 109*   < > 108*   < > 113*   < > 121* 123* 123* 125* 128*  K 4.5  --   --   --  4.1  --   --  4.1  --   --   --   CL 69*  --   --   --  79*  --   --  90*  --   --   --   CO2 23  --   --   --  21*  --   --  24  --   --   --   GLUCOSE 108*  --   --   --  86  --   --  89  --   --   --   BUN 8  --   --   --  15  --   --  27*  --   --   --   CREATININE 1.62*  --   --   --  2.08*  --   --  1.96*  --   --   --   CALCIUM 8.5*  --   --   --  8.1*  --   --  8.3*  --   --   --   MG  --   --  1.7  --   --   --    --  2.4  --   --   --    < > = values in this interval not displayed.    Liver Function Tests: Recent Labs  Lab 06/23/2021 0629  AST 256*  ALT 119*  ALKPHOS 116  BILITOT 5.0*  PROT 6.8  ALBUMIN 3.1*   Recent Labs  Lab 06/19/2021 0629  LIPASE 86*   Recent Labs  Lab 06/22/21 1531  AMMONIA 96*    CBC: Recent Labs  Lab 07/01/2021 0629 06/21/21 0331 06/22/21 0408  WBC 11.0* 11.1* 10.2  NEUTROABS 8.1*  --   --   HGB 15.3 13.7 14.2  HCT 41.7 38.2* 40.8  MCV 102.2* 102.4* 107.4*  PLT 129* 107* 135*    Cardiac Enzymes: No results for input(s): CKTOTAL, CKMB, CKMBINDEX, TROPONINI in the last 168 hours.  BNP: Invalid input(s): POCBNP  CBG: Recent Labs  Lab 06/26/2021 2004 06/22/21 1206 06/22/21 1529  GLUCAP 99 105* 70    Microbiology: Results for orders placed or performed during the hospital encounter of 06/07/2021  Resp Panel by RT-PCR (Flu A&B, Covid) Nasopharyngeal Swab     Status: None   Collection Time: 06/21/2021  4:08 PM   Specimen: Nasopharyngeal Swab; Nasopharyngeal(NP) swabs in vial transport medium  Result Value Ref Range Status   SARS Coronavirus 2 by RT PCR NEGATIVE NEGATIVE Final    Comment: (NOTE) SARS-CoV-2 target nucleic acids are NOT DETECTED.  The SARS-CoV-2 RNA is generally detectable in upper respiratory specimens during the acute phase of infection. The lowest concentration of SARS-CoV-2 viral copies this assay can detect is 138 copies/mL. A negative result does not preclude SARS-Cov-2 infection and should not be used as the sole basis for treatment or other patient management decisions. A negative result may occur with  improper specimen collection/handling, submission of specimen other than nasopharyngeal swab, presence of viral mutation(s) within the areas targeted by this assay, and inadequate number of viral copies(<138 copies/mL). A negative result must be combined with clinical observations, patient history, and  epidemiological information. The expected result is Negative.  Fact Sheet for Patients:  EntrepreneurPulse.com.au  Fact Sheet for Healthcare Providers:  IncredibleEmployment.be  This test is no t yet approved or cleared by the [place] FDA and  has been authorized for detection and/or diagnosis of SARS-CoV-2 by FDA under an Emergency Use Authorization (EUA). This EUA will remain  in effect (meaning this test can be used) for the duration of the COVID-19 declaration under Section 564(b)(1) of the Act, 21 U.S.C.section 360bbb-3(b)(1), unless the authorization is terminated  or revoked sooner.       Influenza A by PCR NEGATIVE NEGATIVE Final   Influenza B by PCR NEGATIVE NEGATIVE Final    Comment: (NOTE) The Xpert Xpress SARS-CoV-2/FLU/RSV plus assay is intended as an aid in the diagnosis of influenza from Nasopharyngeal swab specimens and should not be used as a sole basis for treatment. Nasal washings and aspirates are unacceptable for Xpert Xpress SARS-CoV-2/FLU/RSV testing.  Fact Sheet for Patients: EntrepreneurPulse.com.au  Fact Sheet for Healthcare Providers: IncredibleEmployment.be  This test is not yet approved or cleared by the [place] FDA and has been authorized for detection and/or diagnosis of SARS-CoV-2 by FDA under an Emergency Use Authorization (EUA). This EUA will remain in effect (meaning this test can be used) for the duration of the COVID-19 declaration under Section 564(b)(1) of the Act, 21 U.S.C. section 360bbb-3(b)(1), unless the authorization is terminated or revoked.  Performed at [facility], [address] [place], [place] [address]   MRSA Next Gen by PCR, Nasal     Status: None   Collection Time: 06/08/2021  8:23 PM   Specimen: Nasal Mucosa; Nasal Swab  Result Value Ref Range Status   MRSA by PCR Next Gen NOT DETECTED NOT DETECTED Final    Comment:  (NOTE) The GeneXpert MRSA Assay (FDA approved for NASAL specimens only), is one component of a comprehensive MRSA colonization surveillance program. It is not intended to diagnose MRSA infection nor to guide or monitor treatment for MRSA infections. Test performance is not FDA approved in patients less than 65 years old. Performed at [facility], [address], [place], [place] [address]  Coagulation Studies: No results for input(s): LABPROT, INR in the last 72 hours.  Urinalysis: Recent Labs    06/08/2021 0629  COLORURINE AMBER*  LABSPEC 1.043*  PHURINE 5.0  GLUCOSEU NEGATIVE  HGBUR SMALL*  BILIRUBINUR SMALL*  KETONESUR 5*  PROTEINUR 30*  NITRITE NEGATIVE  LEUKOCYTESUR NEGATIVE      Imaging: DG Abd 1 View  Result Date: 06/22/2021 CLINICAL DATA:  Evaluate OG tube placement. EXAM: ABDOMEN - 1 VIEW COMPARISON:  Chest radiograph 06/22/2021 FINDINGS: Orogastric tube extends into the abdomen. The tip is in the distal gastric body region. Lower lungs are clear. Small amount of bowel gas in the abdomen. IMPRESSION: Orogastric tube in the distal gastric body region. Electronically Signed   By: Markus Daft M.D.   On: 06/22/2021 11:22   DG Chest Port 1 View  Result Date: 06/22/2021 CLINICAL DATA:  Intubated. EXAM: PORTABLE CHEST 1 VIEW COMPARISON:  Chest x-ray earlier, same date. FINDINGS: The endotracheal tube is 4 cm above the carina. The NG tube is coursing down the esophagus and into the stomach. The left IJ central venous catheter tip is in the mid SVC. The cardiac silhouette, mediastinal and hilar contours are within normal limits. The lungs are clear. IMPRESSION: 1. Support apparatus in good position without complicating features. 2. No acute cardiopulmonary findings. Electronically Signed   By: Marijo Sanes M.D.   On: 06/22/2021 11:23   DG Chest Port 1 View  Result Date: 06/22/2021 CLINICAL DATA:  Dyspnea. EXAM: PORTABLE CHEST 1 VIEW COMPARISON:  None. FINDINGS:  The heart size and mediastinal contours are within normal limits. Both lungs are clear. Hypoinflation of the lungs is noted. The visualized skeletal structures are unremarkable. IMPRESSION: No active disease.  Hypoinflation of the lungs. Electronically Signed   By: Marijo Conception M.D.   On: 06/22/2021 09:57     Medications:    sodium chloride Stopped (06/22/21 1037)   ampicillin-sulbactam (UNASYN) IV 3 g (06/22/21 1246)   feeding supplement (VITAL HIGH PROTEIN)     fentaNYL infusion INTRAVENOUS 25 mcg/hr (06/22/21 1200)   norepinephrine (LEVOPHED) Adult infusion 2 mcg/min (06/22/21 1620)   propofol (DIPRIVAN) infusion 11.3999 mcg/kg/min (06/22/21 1200)   sodium chloride (hypertonic) 30 mL/hr at 06/22/21 1200   thiamine injection 500 mg (06/22/21 1405)   Followed by   Derrill Memo ON 06/25/2021] thiamine injection      Chlorhexidine Gluconate Cloth  6 each Topical Q0600   cloNIDine  0.1 mg Oral BID   enoxaparin (LOVENOX) injection  40 mg Subcutaneous Q24H   feeding supplement (PROSource TF)  45 mL Per Tube TID   folic acid  1 mg Intravenous Daily   free water  30 mL Per Tube Q4H   lactulose  20 g Oral TID   LORazepam  0-4 mg Intravenous Q8H   multivitamin with minerals  1 tablet Oral Daily   pantoprazole sodium  40 mg Per Tube Daily   [START ON 06/27/2021] thiamine injection  100 mg Intravenous Q24H   acetaminophen **OR** acetaminophen, fentaNYL, LORazepam **OR** LORazepam, ondansetron **OR** ondansetron (ZOFRAN) IV  Assessment/ Plan:  Mr. Robert Coffey is a 51 y.o.  male with past medical history including alcohol abuse, chronic back pain, and peripheral neuropathy, who was admitted to [facility] on 06/11/2021 for Hyponatremia [E87.1] Elevated liver function tests [R79.89] Generalized weakness [R53.1] AKI (acute kidney injury) ([place]) [E26.8] Acute metabolic encephalopathy [T41.96]   Hyponatremia likely due to poor nutrition and ETOH consumption.Sodium on admission 109.  Patient started on 3%  hypertonic solution and transferred to ICU.   Sodium currently 123 this a.m., 125 at noon.  Will maintain 3% hypertonic solution at current rate.  If decrease in sodium may consider increasing rate slightly.  We will continue to monitor sodium and urine output frequently.  Goal of sodium 130 before consideration of discontinuing drip.  Acute kidney injury with last known creatinine baseline of 0.9 on 03/11/2020. Suspected due poor oral intake with a component of IV contrast exposure on 06/29/2021.   Creatinine slightly improved today.  We will continue to monitor  Lab Results  Component Value Date   CREATININE 1.96 (H) 06/22/2021   CREATININE 2.08 (H) 06/21/2021   CREATININE 1.62 (H) 06/29/2021    Intake/Output Summary (Last 24 hours) at 06/22/2021 1635 Last data filed at 06/22/2021 1200 Gross per 24 hour  Intake 603.91 ml  Output 800 ml  Net -196.09 ml   3.  Acute metabolic acidosis.  Resolved at this time    LOS: 2 Colon Flattery 5/18/20234:35 PM

## 2021-06-22 NOTE — Consult Note (Signed)
MEDICATION RELATED CONSULT NOTE  Pharmacy Consult for Hypertonic Saline Monitoring Indication: Severe hyponatremia  Patient Measurements: Height: 6' 3" (190.5 cm) Weight: (!) 145.1 kg (319 lb 14.2 oz) IBW/kg (Calculated) : 84.5  Intake/Output from previous day: 05/17 0701 - 05/18 0700 In: 1263.8 [I.V.:1263.8] Out: 400 [Urine:400] Intake/Output from this shift: No intake/output data recorded.  Labs: Recent Labs    06/14/2021 0629 06/29/2021 1207 06/21/21 0331 06/22/21 0408  WBC 11.0*  --  11.1* 10.2  HGB 15.3  --  13.7 14.2  HCT 41.7  --  38.2* 40.8  PLT 129*  --  107* 135*  CREATININE 1.62*  --  2.08* 1.96*  MG  --  1.7  --  2.4  ALBUMIN 3.1*  --   --   --   PROT 6.8  --   --   --   AST 256*  --   --   --   ALT 119*  --   --   --   ALKPHOS 116  --   --   --   BILITOT 5.0*  --   --   --     Estimated Creatinine Clearance: 70.9 mL/min (A) (by C-G formula based on SCr of 1.96 mg/dL (H)).   Medications:  3% NaCl at 30 cc/hr  Assessment: Patient is a 51 y/o M with medical history including obesity, chronic back pain who presented to the ED 5/16 with weakness in setting of abdominal cramps, nausea, vomiting, and diarrhea. Labs on admission notable for Na 109, Cl 69, Scr 1.62, transaminitis with associated hyperbilirubinemia and mildly elevated lipase. Imaging notable for hepatic steatosis. Vitals with hypertension and tachycardia. Patient was initiated on hypertonic saline for severe symptomatic hyponatremia. Pharmacy consulted to assist with monitoring to avoid overcorrection / risk for ODS.   Goal of Therapy:  Increase Na 8 - 12 mmol/L over 24h Notify provider if Na increases > 4 mmol/L over 2 hours or 6 mmol/L over 4 hours  Plan: delta of 10 mmol/L over 24 hrs --Will continue to monitor Na and notify provider if above parameters met --Na q4h while on hypertonic saline  Benita Gutter 06/22/2021,7:41 AM

## 2021-06-22 NOTE — Consult Note (Signed)
Pharmacy Antibiotic Note  Robert Coffey is a 51 y.o. male admitted on 06/27/2021 with  acute metabolic encephalopathy  likely secondary to severe hyponatremia and alcohol withdrawal.  Was initially started on IV Unasyn for suspected tracheal aspiration, but patient's condition continues to deteriorate. Pharmacy has been consulted for Vancomycin and Cefepime dosing.  Patient with believed AKI. Last known creatinine baseline of 0.9 on 03/10/2020, per nephrology. Patient's creatinine on 06/07/2021 was 1.62, in which he also received IV contrast that day. Creatinine peaked at 2.08, now downtrending to 1.96 today. Unsure if this will be near a possible new baseline for the patient. Will dose using AUC dosing for now and continue to monitor patient's renal function.  Plan: Vancomycin 2000 mg IV  x 1 as loading dose, followed by 1750 mg Q 24 hrs. Goal AUC 400-550. Expected AUC: 481.2 SCr used: 1.96. Vd used: 0.5  Cefepime 2g IV Q 8 hours   Height: 6\' 3"  (190.5 cm) Weight: (!) 145.1 kg (319 lb 14.2 oz) IBW/kg (Calculated) : 84.5  Temp (24hrs), Avg:100.7 F (38.2 C), Min:99 F (37.2 C), Max:103.5 F (39.7 C)  Recent Labs  Lab 07/04/2021 0629 06/21/21 0331 06/22/21 0408 06/22/21 1855 06/22/21 1857  WBC 11.0* 11.1* 10.2  --  6.8  CREATININE 1.62* 2.08* 1.96*  --   --   LATICACIDVEN  --   --   --  6.5*  --     Estimated Creatinine Clearance: 70.9 mL/min (A) (by C-G formula based on SCr of 1.96 mg/dL (H)).    Allergies  Allergen Reactions   Sulfa Antibiotics     Antimicrobials this admission: Unasyn 5/18 x 1 Vanc/CFP 5/18 >>   Dose adjustments this admission: N/a  Microbiology results: 5/18 Resp cx: sent 5/18 MRSA PCR: negative  Thank you for allowing pharmacy to be a part of this patient's care.  Pearla Dubonnet 06/22/2021 8:18 PM

## 2021-06-22 NOTE — Progress Notes (Signed)
Initial Nutrition Assessment  DOCUMENTATION CODES:   Obesity unspecified  INTERVENTION:   Vital HP @70ml /hr- Initiate at 50ml/hr and increase by 36ml/hr q 8 hours until goal rate is reached.   Propofol: 10.0 ml/hr- provides 264kcal/day   Pro-Source 68ml TID via tube, provides 40kcal and 11g of protein per serving   Free water flushes 63ml q4 hours to maintain tube patency   Regimen provides 2046kcal/day, 180g/day protein and 1583ml/day of free water   NUTRITION DIAGNOSIS:   Inadequate oral intake related to inability to eat (pt sedated and ventilated) as evidenced by NPO status.  GOAL:   Provide needs based on ASPEN/SCCM guidelines  MONITOR:   Vent status, Labs, Weight trends, TF tolerance, Skin, I & O's  REASON FOR ASSESSMENT:   Ventilator    ASSESSMENT:   51 y/o male with h/o etoh abuse, OSA and DDD who is admitted with alcohol withdrawal, encephalopathy, AKI and aspiration requiring intubation.  Pt sedated and ventilated. OGT in place. Plan is to start tube feeds today. Per chart, pt appears weight stable at baseline.   Medications reviewed and include: lovenox, folic acid, MVI, protonix, thiamine, unasyn, levophed, propofol  Labs reviewed: Na 123(L), creat 1.96(H), Mg 2.4 wnl  Patient is currently intubated on ventilator support MV: 8.8 L/min Temp (24hrs), Avg:99 F (37.2 C), Min:97.9 F (36.6 C), Max:99.8 F (37.7 C)  Propofol: 10.0 ml/hr- provides 264kcal/day   MAP- >58mmHg   UOP- 465ml   NUTRITION - FOCUSED PHYSICAL EXAM:  Flowsheet Row Most Recent Value  Orbital Region No depletion  Upper Arm Region No depletion  Thoracic and Lumbar Region No depletion  Buccal Region No depletion  Temple Region No depletion  Clavicle Bone Region No depletion  Clavicle and Acromion Bone Region No depletion  Scapular Bone Region No depletion  Dorsal Hand No depletion  Patellar Region No depletion  Anterior Thigh Region No depletion  Posterior Calf Region  No depletion  Edema (RD Assessment) None  Hair Reviewed  Eyes Reviewed  Mouth Reviewed  Skin Reviewed  Nails Reviewed   Diet Order:   Diet Order             Diet NPO time specified  Diet effective now                  EDUCATION NEEDS:   No education needs have been identified at this time  Skin:  Skin Assessment: Reviewed RN Assessment  Last BM:  5/16  Height:   Ht Readings from Last 1 Encounters:  06/21/2021 6\' 3"  (1.905 m)    Weight:   Wt Readings from Last 1 Encounters:  06/05/2021 (!) 145.1 kg    Ideal Body Weight:  89 kg  BMI:  Body mass index is 39.98 kg/m.  Estimated Nutritional Needs:   Kcal:  1596-2031kcal/day  Protein:  >178g/day  Fluid:  2.7-3.0L/day  Koleen Distance MS, RD, LDN Please refer to [facility] for RD and/or RD on-call/weekend/after hours pager

## 2021-06-22 NOTE — Consult Note (Signed)
NAME:  Robert Coffey, MRN:  [id number], DOB:  May 17, 1970, LOS: 2 ADMISSION DATE:  07/03/2021, CONSULTATION DATE:  06/22/2021 REFERRING MD:  Dr. Jimmye Norman, CHIEF COMPLAINT:  Acute Respiratory Distress, AMS   Brief Pt Description / Synopsis:  51 year old male admitted with acute metabolic encephalopathy secondary to alcohol withdrawal and severe hyponatremia requiring hypertonic saline.  On 5/18 developed acute hypoxic & hypercapnic respiratory failure requiring intubation and mechanical ventilation.  History of Present Illness:  Robert Coffey is a 51 year old male with a past medical history significant for alcohol abuse, obesity, chronic back pain, peripheral neuropathy who presented to [facility] ED on 06/27/2021 due to altered mental status, poor p.o. intake, progressive weakness with difficulty getting around at home.  Patient is now intubated and unable to contribute to history, therefore history is obtained from patient's wife at bedside and chart review.  Per the patient's wife, the patient has not been feeling well with poor p.o. intake for approximately a month, however has continued to drink alcohol daily (usually 3-4 drinks of liquor) during that time.  Over the past several days he has become progressively weak with difficulty getting around at home (not even in bed able to get out of bed), intermittent confusion, and diffuse abdominal cramping/nausea/vomiting/diarrhea.  Denies any history of falls, chest pain, shortness of breath, fever, cough.  Last known alcoholic drink was the day prior to admission on Sunday.  ED Course: Initial Vital Signs: Temperature 98.1 F orally, blood pressure 136/119, respiratory rate 18, pulse 106, SPO2 95% Significant Labs: Sodium 109, chloride 69, glucose 108, BUN 8, creatinine 1.62, anion gap 17, albumin 3.1, lipase 86, AST 256, ALT 119, total bilirubin 5.0, high-sensitivity troponin 67, serum osmolality 236, urine osmolality 437, urine sodium less than 10 WBC  11.0, platelets 129, TSH 2.1 COVID-19 and influenza PCR negative Urinalysis negative for UTI Urine drug screen positive for tricyclics Ethyl alcohol less than 10 Imaging CT head without contrast>>IMPRESSION: 1. Fluid or debris in the left sinus tympani/middle ear, cannot exclude otitis media. There small bilateral mastoid effusions and chronic bilateral maxillary sinusitis. 2. Advanced for age cerebral atrophy. 3. Atherosclerosis. CT abdomen and pelvis>>IMPRESSION: Hepatic steatosis. 2.6 cm enhancing left adrenal nodule is noted. When the patient is clinically stable and able to follow directions and hold their breath (preferably as an outpatient) further evaluation with dedicated abdominal MRI should be considered. Medications Administered: 1 L normal saline bolus, hypertonic saline infusion  He was admitted by the hospitalist for further work-up and treatment of acute metabolic encephalopathy in the setting of DTs and severe hyponatremia.  Please see "significant hospital events" section below for full detailed hospital course.  Pertinent  Medical History  Alcohol abuse Obesity Chronic back  Micro Data:  5/16: SARS-CoV-2 and influenza PCR>> negative 5/16: HIV screen>> nonreactive 5/16: MRSA PCR>> negative 5/18: Tracheal aspirate>>  Antimicrobials:  5/18: Unasyn>>  Significant Hospital Events: Including procedures, antibiotic start and stop dates in addition to other pertinent events   5/16: Admitted by hospitalist for acute metabolic encephalopathy in setting of severe hyponatremia and DTs.  Requiring hypertonic saline 5/18: Developed acute hypoxic respiratory failure, possible concern for aspiration.  Required intubation and mechanical ventilation.  Interim History / Subjective:  -Patient remained very encephalopathic and spite of CIWA protocol -Developed acute hypoxic respiratory failure, concern for possible aspiration -PCCM asked to evaluate~given acute respiratory  distress decision made to emergently intubate -We will place central line given lack of IV access ~verbal consent given by patient's wife to Dr. Mortimer Fries  Objective   Blood pressure (!) 107/56, pulse (!) 117, temperature 99.1 F (37.3 C), temperature source Oral, resp. rate (!) 25, height $RemoveBe'6\' 3"'pFMWVmWVu$  (1.905 m), weight (!) 145.1 kg, SpO2 97 %.    Vent Mode: PRVC FiO2 (%):  [100 %] 100 % Set Rate:  [16 bmp] 16 bmp Vt Set:  [550 mL] 550 mL PEEP:  [10 cmH20] 10 cmH20 Plateau Pressure:  [22 cmH20] 22 cmH20   Intake/Output Summary (Last 24 hours) at 06/22/2021 1104 Last data filed at 06/21/2021 1800 Gross per 24 hour  Intake 210.01 ml  Output 400 ml  Net -189.99 ml   Filed Weights   06/26/2021 0549 06/30/2021 2005  Weight: 92.1 kg (!) 145.1 kg    Examination: General: Critically ill-appearing obese male, laying in bed, with acute respiratory distress HENT: Atraumatic, normocephalic, neck supple, difficult to assess JVD due to body habitus Lungs: Coarse breath sounds bilaterally, tachypnea, increased work of breathing Cardiovascular: Tachycardia, regular rhythm, S1-S2, no murmurs, rubs, gallops Abdomen: Obese, soft, nontender, nondistended, no guarding or rebound tenderness (abdominal exam is difficult to ascertain due to DTs) Extremities: Normal bulk and tone, no deformities, no edema Neuro: Delirious, moves all extremities purposefully but does not follow commands, pupils PERRLA GU: Deferred  Resolved Hospital Problem list     Assessment & Plan:   Acute hypoxic & hypercapnic respiratory failure in the setting of suspected aspiration in the setting of severe DTs -Full vent support, implement lung protective strategies -Plateau pressures less than 30 cm H20 -Wean FiO2 & PEEP as tolerated to maintain O2 sats >92% -Follow intermittent Chest X-ray & ABG as needed -Spontaneous Breathing Trials when respiratory parameters met and mental status permits -Implement VAP Bundle -Prn  Bronchodilators -Start empiric Unasyn for now  Suspected aspiration CT head 5/16 unable to exclude left otitis media and chronic bilateral maxillary sinusitis -Monitor fever curve -Trend WBC's & Procalcitonin -Follow cultures as above -Start empiric Unasyn pending cultures & sensitivities -Consider ENT consult  Hypotonic Hyponatremia, suspect secondary to dehydration & poor p.o. intake Acute kidney injury -Monitor I&O's / urinary output -Follow BMP -Ensure adequate renal perfusion -Avoid nephrotoxic agents as able -Replace electrolytes as indicated -Pharmacy following for assistance with electrolyte replacement, appreciate input -Nephrology following, appreciate input -Continue hypertonic 3% saline as per nephrology & Pharmacy -Follow sodium levels every 4 hours -Target rate of Na correction is 8 mmol per 24 hours  Acute metabolic encephalopathy in the setting of severe DTs and severe hyponatremia Sedation needs in the setting of mechanical ventilation -Treatment of DTs and severe hyponatremia as outlined above -Maintain a RASS goal of 0 to -1 -Fentanyl and propofol as needed to maintain RASS goal -Avoid sedating medications as able -Daily wake up assessment -CT head 5/16 unable to exclude left otitis media, chronic bilateral maxillary sinusitis; otherwise no acute intracranial abnormalities -High-dose thiamine x3 days -Continue folic acid and multivitamin -Continue CIWA protocol  Transaminitis, suspect secondary to alcohol abuse -Trend LFTs and coags -CT abdomen pelvis shows hepatic steatosis  Thrombocytopenia, suspect secondary to alcohol abuse -Monitor for S/Sx of bleeding -Trend CBC -Lovenox for VTE Prophylaxis  -Transfuse for Hgb <7 -Transfuse platelets for platelet count less than 50 with active bleeding     Patient is critically ill.  Prognosis is guarded.  High risk for further decompensation, cardiac arrest and death.  Best Practice (right click and  "Reselect all SmartList Selections" daily)   Diet/type: NPO DVT prophylaxis: LMWH GI prophylaxis: PPI Lines: Central line Foley:  Yes, and  it is still needed Code Status:  full code Last date of multidisciplinary goals of care discussion [06/22/21]  Patient's spouse updated at bedside by Dr. Mortimer Fries 5/18 regarding development of acute hypoxic respiratory failure requiring emergent intubation and mechanical ventilation.  All questions answered to her satisfaction.  Labs   CBC: Recent Labs  Lab 06/24/2021 0629 06/21/21 0331 06/22/21 0408  WBC 11.0* 11.1* 10.2  NEUTROABS 8.1*  --   --   HGB 15.3 13.7 14.2  HCT 41.7 38.2* 40.8  MCV 102.2* 102.4* 107.4*  PLT 129* 107* 135*    Basic Metabolic Panel: Recent Labs  Lab 06/30/2021 0629 06/22/2021 1015 06/25/2021 1207 06/22/2021 1408 06/21/21 0331 06/21/21 0920 06/21/21 1555 06/21/21 1940 06/21/21 2337 06/22/21 0408 06/22/21 0731  NA 109*   < > 108*   < > 113*   < > 118* 121* 121* 123* 123*  K 4.5  --   --   --  4.1  --   --   --   --  4.1  --   CL 69*  --   --   --  79*  --   --   --   --  90*  --   CO2 23  --   --   --  21*  --   --   --   --  24  --   GLUCOSE 108*  --   --   --  86  --   --   --   --  89  --   BUN 8  --   --   --  15  --   --   --   --  27*  --   CREATININE 1.62*  --   --   --  2.08*  --   --   --   --  1.96*  --   CALCIUM 8.5*  --   --   --  8.1*  --   --   --   --  8.3*  --   MG  --   --  1.7  --   --   --   --   --   --  2.4  --    < > = values in this interval not displayed.   GFR: Estimated Creatinine Clearance: 70.9 mL/min (A) (by C-G formula based on SCr of 1.96 mg/dL (H)). Recent Labs  Lab 07/04/2021 0629 06/21/21 0331 06/22/21 0408  WBC 11.0* 11.1* 10.2    Liver Function Tests: Recent Labs  Lab 06/26/2021 0629  AST 256*  ALT 119*  ALKPHOS 116  BILITOT 5.0*  PROT 6.8  ALBUMIN 3.1*   Recent Labs  Lab 06/21/2021 0629  LIPASE 86*   No results for input(s): AMMONIA in the last 168 hours.  ABG     Component Value Date/Time   HCO3 24.2 06/24/2021 1015   O2SAT 85.1 06/10/2021 1015     Coagulation Profile: No results for input(s): INR, PROTIME in the last 168 hours.  Cardiac Enzymes: No results for input(s): CKTOTAL, CKMB, CKMBINDEX, TROPONINI in the last 168 hours.  HbA1C: No results found for: HGBA1C  CBG: Recent Labs  Lab 06/05/2021 2004  GLUCAP 99    Review of Systems:   Unable to assess due to AMS & Acute Respiratory Distress   Past Medical History:  He,  has no past medical history on file.   Surgical History:  History reviewed. No pertinent surgical history.  Social History:      Family History:  His family history is not on file.   Allergies Allergies  Allergen Reactions   Sulfa Antibiotics      Home Medications  Prior to Admission medications   Medication Sig Start Date End Date Taking? Authorizing Provider  atorvastatin (LIPITOR) 20 MG tablet Take 20 mg by mouth daily. 02/03/21  Yes [provider]  cyanocobalamin 1000 MCG tablet Take by mouth.   Yes [provider]  hydrochlorothiazide (HYDRODIURIL) 25 MG tablet Take 25 mg by mouth daily. 05/30/21  Yes [provider]  lisinopril (ZESTRIL) 40 MG tablet Take 40 mg by mouth daily. 05/04/21  Yes [provider]  rOPINIRole (REQUIP) 0.25 MG tablet Take 0.25 mg by mouth 3 (three) times daily. 07/28/20  Yes [provider]  vitamin C (ASCORBIC ACID) 500 MG tablet Take 500 mg by mouth daily.   Yes [provider]  zolpidem (AMBIEN) 10 MG tablet Take 10 mg by mouth at bedtime as needed. 06/07/21  Yes [provider]     Critical care time: 55 minutes     Darel Hong, AGACNP-BC [facility] [place] epic messenger for cross cover needs If after hours, please call E-link

## 2021-06-22 NOTE — Progress Notes (Signed)
At bedside to place USG PIV, CVC being placed.

## 2021-06-22 NOTE — Procedures (Signed)
Arterial Catheter Insertion Procedure Note Robert Coffey [id number] Mar 01, 1970  Procedure: Insertion of Arterial Catheter  Indications: Blood pressure monitoring and Frequent blood sampling  Procedure Details Consent: Risks of procedure as well as the alternatives and risks of each were explained to the (patient/caregiver).  Consent for procedure obtained. Time Out: Verified patient identification, verified procedure, site/side was marked, verified correct patient position, special equipment/implants available, medications/allergies/relevent history reviewed, required imaging and test results available.  Performed  Maximum sterile technique was used including antiseptics, cap, gloves, gown, hand hygiene, mask, and sheet. Skin prep: Chlorhexidine; local anesthetic administered 20 gauge catheter was inserted into left radial artery using the Seldinger technique.  Evaluation Blood flow good; BP tracing good. Complications: No apparent complications.   BIOPATCH applied to the insertion site.   Darel Hong, AGACNP-BC [facility] Prefer epic messenger for cross cover needs If after hours, please call E-link   Bradly Bienenstock 06/22/2021

## 2021-06-23 ENCOUNTER — Inpatient Hospital Stay: Payer: BC Managed Care – PPO

## 2021-06-23 ENCOUNTER — Inpatient Hospital Stay (HOSPITAL_COMMUNITY)
Admit: 2021-06-23 | Discharge: 2021-06-23 | Disposition: A | Discharge status: Home / Self Care | Payer: BC Managed Care – PPO | Attending: Pulmonary Disease | Admitting: Pulmonary Disease

## 2021-06-23 DIAGNOSIS — J9601 Acute respiratory failure with hypoxia: Secondary | ICD-10-CM | POA: Diagnosis not present

## 2021-06-23 DIAGNOSIS — R6521 Severe sepsis with septic shock: Secondary | ICD-10-CM | POA: Diagnosis not present

## 2021-06-23 DIAGNOSIS — J9602 Acute respiratory failure with hypercapnia: Secondary | ICD-10-CM

## 2021-06-23 DIAGNOSIS — G9341 Metabolic encephalopathy: Secondary | ICD-10-CM | POA: Diagnosis not present

## 2021-06-23 LAB — BLOOD GAS, ARTERIAL
Acid-base deficit: 18.2 mmol/L — ABNORMAL HIGH (ref 0.0–2.0)
Acid-base deficit: 13.3 mmol/L — ABNORMAL HIGH (ref 0.0–2.0)
Acid-base deficit: 5.2 mmol/L — ABNORMAL HIGH (ref 0.0–2.0)
Acid-base deficit: 2.9 mmol/L — ABNORMAL HIGH (ref 0.0–2.0)
Bicarbonate: 12.4 mmol/L — ABNORMAL LOW (ref 20.0–28.0)
Bicarbonate: 14.6 mmol/L — ABNORMAL LOW (ref 20.0–28.0)
Bicarbonate: 20.6 mmol/L (ref 20.0–28.0)
Bicarbonate: 22 mmol/L (ref 20.0–28.0)
FIO2: 80 %
FIO2: 80 %
FIO2: 80 %
FIO2: 70 %
MECHVT: 650 mL
MECHVT: 650 mL
MECHVT: 650 mL
MECHVT: 650 mL
Mechanical Rate: 22
Mechanical Rate: 22
Mechanical Rate: 22
O2 Saturation: 99.6 %
O2 Saturation: 99.8 %
O2 Saturation: 99.7 %
O2 Saturation: 100 %
PEEP: 10 cmH2O
PEEP: 10 cmH2O
PEEP: 10 cmH2O
PEEP: 10 cmH2O
Patient temperature: 37
Patient temperature: 37
Patient temperature: 37
Patient temperature: 37
RATE: 22 resp/min
pCO2 arterial: 47 mmHg (ref 32–48)
pCO2 arterial: 40 mmHg (ref 32–48)
pCO2 arterial: 40 mmHg (ref 32–48)
pCO2 arterial: 38 mmHg (ref 32–48)
pH, Arterial: 7.03 — CL (ref 7.35–7.45)
pH, Arterial: 7.17 — CL (ref 7.35–7.45)
pH, Arterial: 7.32 — ABNORMAL LOW (ref 7.35–7.45)
pH, Arterial: 7.37 (ref 7.35–7.45)
pO2, Arterial: 112 mmHg — ABNORMAL HIGH (ref 83–108)
pO2, Arterial: 116 mmHg — ABNORMAL HIGH (ref 83–108)
pO2, Arterial: 141 mmHg — ABNORMAL HIGH (ref 83–108)
pO2, Arterial: 141 mmHg — ABNORMAL HIGH (ref 83–108)

## 2021-06-23 LAB — ECHOCARDIOGRAM COMPLETE
AR max vel: 1.64 cm2
AV Area VTI: 1.28 cm2
AV Area mean vel: 1.72 cm2
AV Mean grad: 7 mmHg
AV Peak grad: 14.1 mmHg
Ao pk vel: 1.88 m/s
Area-P 1/2: 5.13 cm2
Height: 75 in
MV VTI: 1.85 cm2
S' Lateral: 4.22 cm
Weight: 5418.02 oz

## 2021-06-23 LAB — COMPREHENSIVE METABOLIC PANEL
ALT: 175 U/L — ABNORMAL HIGH (ref 0–44)
AST: 752 U/L — ABNORMAL HIGH (ref 15–41)
Albumin: 2.5 g/dL — ABNORMAL LOW (ref 3.5–5.0)
Alkaline Phosphatase: 107 U/L (ref 38–126)
Anion gap: 21 — ABNORMAL HIGH (ref 5–15)
BUN: 38 mg/dL — ABNORMAL HIGH (ref 6–20)
CO2: 15 mmol/L — ABNORMAL LOW (ref 22–32)
Calcium: 8.1 mg/dL — ABNORMAL LOW (ref 8.9–10.3)
Chloride: 95 mmol/L — ABNORMAL LOW (ref 98–111)
Creatinine, Ser: 3.17 mg/dL — ABNORMAL HIGH (ref 0.61–1.24)
GFR, Estimated: 23 mL/min — ABNORMAL LOW (ref >60)
Glucose, Bld: 140 mg/dL — ABNORMAL HIGH (ref 70–99)
Potassium: 4.5 mmol/L (ref 3.5–5.1)
Sodium: 131 mmol/L — ABNORMAL LOW (ref 135–145)
Total Bilirubin: 6.3 mg/dL — ABNORMAL HIGH (ref 0.3–1.2)
Total Protein: 5.9 g/dL — ABNORMAL LOW (ref 6.5–8.1)

## 2021-06-23 LAB — TRIGLYCERIDES: Triglycerides: 189 mg/dL — ABNORMAL HIGH (ref <150)

## 2021-06-23 LAB — PROTIME-INR
INR: 2 — ABNORMAL HIGH (ref 0.8–1.2)
Prothrombin Time: 22.7 seconds — ABNORMAL HIGH (ref 11.4–15.2)

## 2021-06-23 LAB — CBC
HCT: 42.3 % (ref 39.0–52.0)
Hemoglobin: 13.9 g/dL (ref 13.0–17.0)
MCH: 38.3 pg — ABNORMAL HIGH (ref 26.0–34.0)
MCHC: 32.9 g/dL (ref 30.0–36.0)
MCV: 116.5 fL — ABNORMAL HIGH (ref 80.0–100.0)
Platelets: 133 10*3/uL — ABNORMAL LOW (ref 150–400)
RBC: 3.63 MIL/uL — ABNORMAL LOW (ref 4.22–5.81)
RDW: 16.6 % — ABNORMAL HIGH (ref 11.5–15.5)
WBC: 19.5 10*3/uL — ABNORMAL HIGH (ref 4.0–10.5)
nRBC: 5 % — ABNORMAL HIGH (ref 0.0–0.2)

## 2021-06-23 LAB — RENAL FUNCTION PANEL
Albumin: 2.3 g/dL — ABNORMAL LOW (ref 3.5–5.0)
Anion gap: 17 — ABNORMAL HIGH (ref 5–15)
BUN: 33 mg/dL — ABNORMAL HIGH (ref 6–20)
CO2: 18 mmol/L — ABNORMAL LOW (ref 22–32)
Calcium: 8.1 mg/dL — ABNORMAL LOW (ref 8.9–10.3)
Chloride: 99 mmol/L (ref 98–111)
Creatinine, Ser: 2.96 mg/dL — ABNORMAL HIGH (ref 0.61–1.24)
GFR, Estimated: 25 mL/min — ABNORMAL LOW (ref >60)
Glucose, Bld: 153 mg/dL — ABNORMAL HIGH (ref 70–99)
Phosphorus: 2.8 mg/dL (ref 2.5–4.6)
Potassium: 4.1 mmol/L (ref 3.5–5.1)
Sodium: 134 mmol/L — ABNORMAL LOW (ref 135–145)

## 2021-06-23 LAB — TROPONIN I (HIGH SENSITIVITY): Troponin I (High Sensitivity): 65 ng/L — ABNORMAL HIGH (ref <18)

## 2021-06-23 LAB — GLUCOSE, CAPILLARY
Glucose-Capillary: 127 mg/dL — ABNORMAL HIGH (ref 70–99)
Glucose-Capillary: 136 mg/dL — ABNORMAL HIGH (ref 70–99)
Glucose-Capillary: 148 mg/dL — ABNORMAL HIGH (ref 70–99)
Glucose-Capillary: 163 mg/dL — ABNORMAL HIGH (ref 70–99)
Glucose-Capillary: 166 mg/dL — ABNORMAL HIGH (ref 70–99)

## 2021-06-23 LAB — LACTIC ACID, PLASMA
Lactic Acid, Venous: >9 mmol/L (ref 0.5–1.9)
Lactic Acid, Venous: >9 mmol/L (ref 0.5–1.9)
Lactic Acid, Venous: 5.5 mmol/L (ref 0.5–1.9)

## 2021-06-23 LAB — HEPARIN LEVEL (UNFRACTIONATED)
Heparin Unfractionated: 0.56 IU/mL (ref 0.30–0.70)
Heparin Unfractionated: 0.55 IU/mL (ref 0.30–0.70)

## 2021-06-23 LAB — T4, FREE: Free T4: 0.86 ng/dL (ref 0.61–1.12)

## 2021-06-23 LAB — PHOSPHORUS: Phosphorus: 4.4 mg/dL (ref 2.5–4.6)

## 2021-06-23 LAB — MAGNESIUM: Magnesium: 2.2 mg/dL (ref 1.7–2.4)

## 2021-06-23 LAB — SODIUM
Sodium: 129 mmol/L — ABNORMAL LOW (ref 135–145)
Sodium: 131 mmol/L — ABNORMAL LOW (ref 135–145)

## 2021-06-23 LAB — PROCALCITONIN: Procalcitonin: 16.69 ng/mL

## 2021-06-23 LAB — AMMONIA: Ammonia: 40 umol/L — ABNORMAL HIGH (ref 9–35)

## 2021-06-23 IMAGING — DX DG CHEST 1V PORT
1 series · 1 of 1 positions shown · non-contrast
Comparison: Same day.

CLINICAL DATA: Central line placement.

EXAM:
PORTABLE CHEST 1 VIEW

[chest ap]
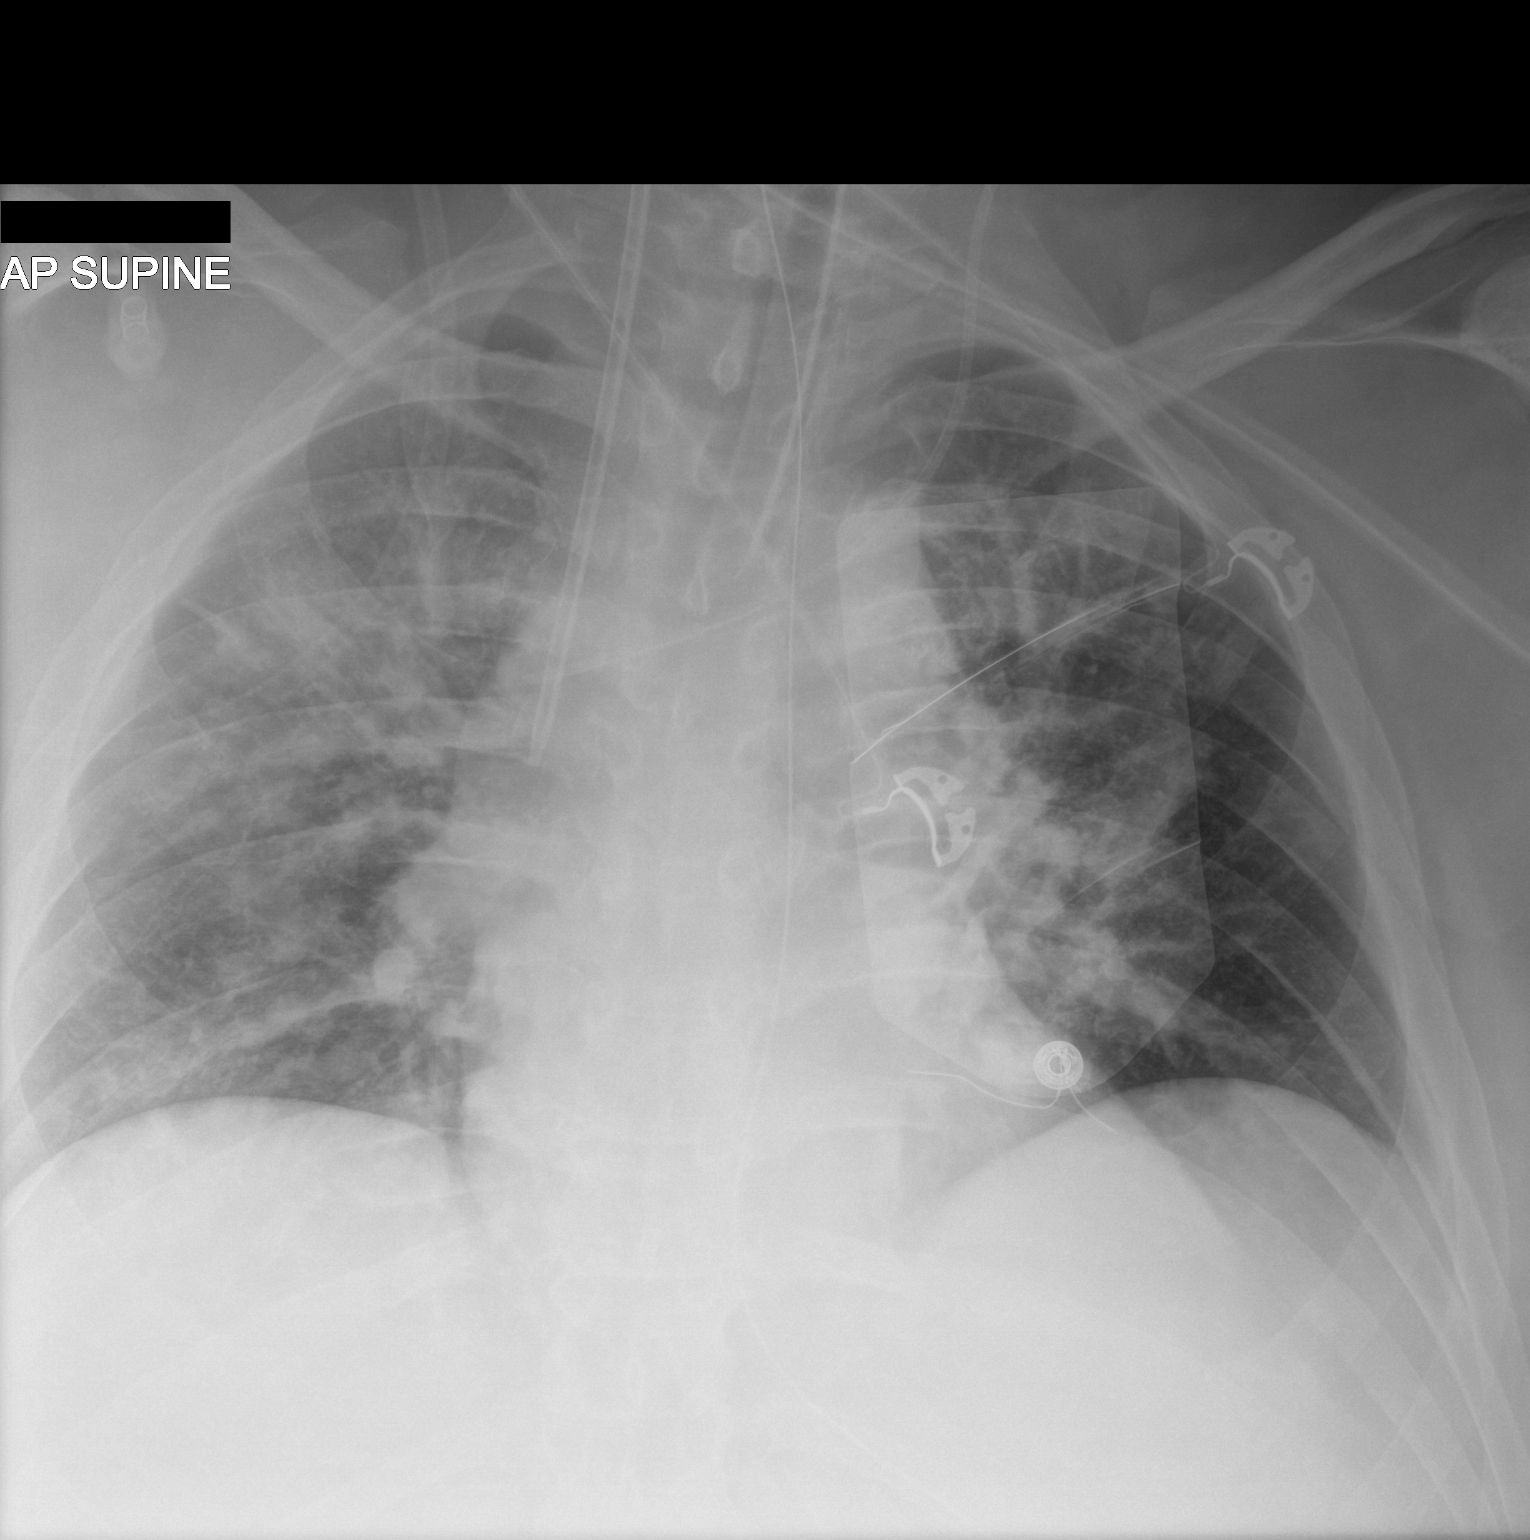

[1 of 1 positions shown; findings below may reference images not displayed]

FINDINGS: Interval placement of right internal jugular catheter with distal
tip in expected position of the SVC. No pneumothorax is noted.
Otherwise stable support apparatus.
IMPRESSION: Interval placement of right internal jugular catheter with distal
tip in expected position of the SVC.

## 2021-06-23 MED ORDER — SODIUM CHLORIDE 0.9 % IV SOLN
2.0000 g | Freq: Two times a day (BID) | INTRAVENOUS | Status: DC
  Administered 2021-06-23 – 2021-06-25 (×4): 2 g via INTRAVENOUS
  Filled 2021-06-23: qty 12.5
  Filled 2021-06-23 (×4): qty 2

## 2021-06-23 MED ORDER — HEPARIN SODIUM (PORCINE) 1000 UNIT/ML DIALYSIS
1000.0000 [IU] | INTRAMUSCULAR | Status: DC | PRN
  Administered 2021-06-27 – 2021-06-28 (×2): 2600 [IU] via INTRAVENOUS_CENTRAL
  Filled 2021-06-23: qty 4
  Filled 2021-06-23: qty 6
  Filled 2021-06-23: qty 3
  Filled 2021-06-23: qty 5
  Filled 2021-06-23: qty 6

## 2021-06-23 MED ORDER — SODIUM BICARBONATE 8.4 % IV SOLN
INTRAVENOUS | Status: AC
  Filled 2021-06-23: qty 100

## 2021-06-23 MED ORDER — PRISMASOL BGK 4/2.5 32-4-2.5 MEQ/L REPLACEMENT SOLN
Status: DC
  Filled 2021-06-23 (×5): qty 5000

## 2021-06-23 MED ORDER — CHLORHEXIDINE GLUCONATE 0.12% ORAL RINSE (MEDLINE KIT)
15.0000 mL | Freq: Two times a day (BID) | OROMUCOSAL | Status: DC
  Administered 2021-06-23 – 2021-07-07 (×29): 15 mL via OROMUCOSAL

## 2021-06-23 MED ORDER — SODIUM CHLORIDE 0.9 % IV SOLN
INTRAVENOUS | Status: AC
  Filled 2021-06-23 (×5): qty 1

## 2021-06-23 MED ORDER — PRISMASOL BGK 4/2.5 32-4-2.5 MEQ/L EC SOLN: Status: DC

## 2021-06-23 MED ORDER — VANCOMYCIN VARIABLE DOSE PER UNSTABLE RENAL FUNCTION (PHARMACIST DOSING): Status: DC

## 2021-06-23 MED ORDER — PHENYLEPHRINE CONCENTRATED 100MG/250ML (0.4 MG/ML) INFUSION SIMPLE
0.0000 ug/min | INTRAVENOUS | Status: DC
  Administered 2021-06-23: 20 ug/min via INTRAVENOUS
  Filled 2021-06-23: qty 250

## 2021-06-23 MED ORDER — PERFLUTREN LIPID MICROSPHERE
1.0000 mL | INTRAVENOUS | Status: AC | PRN
  Administered 2021-06-23: 2 mL via INTRAVENOUS

## 2021-06-23 MED ORDER — ORAL CARE MOUTH RINSE
15.0000 mL | OROMUCOSAL | Status: DC
  Administered 2021-06-23 – 2021-07-05 (×113): 15 mL via OROMUCOSAL

## 2021-06-23 MED ORDER — STERILE WATER FOR INJECTION IV SOLN
INTRAVENOUS | Status: DC
  Filled 2021-06-23 (×2): qty 150

## 2021-06-23 MED ORDER — VANCOMYCIN HCL 1500 MG/300ML IV SOLN
1500.0000 mg | INTRAVENOUS | Status: DC
  Administered 2021-06-23 – 2021-06-24 (×2): 1500 mg via INTRAVENOUS
  Filled 2021-06-23 (×2): qty 300

## 2021-06-23 MED ORDER — SODIUM CHLORIDE 0.9 % IV SOLN
INTRAVENOUS | Status: DC
  Filled 2021-06-23: qty 1

## 2021-06-23 MED ORDER — ANGIOTENSIN II ACETATE 2.5 MG/ML IV SOLN: 0.0000 ng/kg/min | INTRAVENOUS | Status: DC

## 2021-06-23 MED ORDER — ANGIOTENSIN II ACETATE 2.5 MG/ML IV SOLN: 1.2500 ng/kg/min | INTRAVENOUS | Status: DC

## 2021-06-23 MED ORDER — SODIUM BICARBONATE 8.4 % IV SOLN
150.0000 meq | Freq: Once | INTRAVENOUS | Status: AC
  Administered 2021-06-23: 150 meq via INTRAVENOUS
  Filled 2021-06-23: qty 50

## 2021-06-23 NOTE — Progress Notes (Signed)
[place] for IV heparin Indication: pulmonary embolus  Allergies  Allergen Reactions   Sulfa Antibiotics     Patient Measurements: Height: 6\' 3"  (190.5 cm) Weight: (!) 145.1 kg (319 lb 14.2 oz) IBW/kg (Calculated) : 84.5 Heparin Dosing Weight: 117.5 kg  Vital Signs: Temp: 102.2 F (39 C) (05/19 0330) BP: 80/41 (05/19 0330) Pulse Rate: 127 (05/19 0330)  Labs: Recent Labs    06/13/2021 0802 06/21/21 0331 06/22/21 0408 06/22/21 1857 06/22/21 2010 06/22/21 2220 06/22/21 2350 06/23/21 0425  HGB  --  13.7 14.2 14.0  --   --   --  13.9  HCT  --  38.2* 40.8 41.8  --   --   --  42.3  PLT  --  107* 135* 132*  --   --   --  133*  APTT  --   --   --   --  29  --   --   --   LABPROT  --   --   --   --  18.0*  --   --  22.7*  INR  --   --   --   --  1.5*  --   --  2.0*  HEPARINUNFRC  --   --   --   --   --   --   --  0.56  CREATININE  --  2.08* 1.96*  --   --  2.88*  --   --   TROPONINIHS 65*  --   --   --   --  67* 65*  --      Estimated Creatinine Clearance: 48.2 mL/min (A) (by C-G formula based on SCr of 2.88 mg/dL (H)).   Medications:  Medications Prior to Admission  Medication Sig Dispense Refill Last Dose   atorvastatin (LIPITOR) 20 MG tablet Take 20 mg by mouth daily.   06/19/2021 at 1200   cyanocobalamin 1000 MCG tablet Take by mouth.   Past Month   hydrochlorothiazide (HYDRODIURIL) 25 MG tablet Take 25 mg by mouth daily.   06/19/2021   lisinopril (ZESTRIL) 40 MG tablet Take 40 mg by mouth daily.   06/19/2021 at 1800   rOPINIRole (REQUIP) 0.25 MG tablet Take 0.25 mg by mouth 3 (three) times daily.   06/19/2021 at pm   vitamin C (ASCORBIC ACID) 500 MG tablet Take 500 mg by mouth daily.      zolpidem (AMBIEN) 10 MG tablet Take 10 mg by mouth at bedtime as needed.   06/19/2021 at 2200   Scheduled:   Chlorhexidine Gluconate Cloth  6 each Topical Q0600   cloNIDine  0.1 mg Oral BID   diazepam  1 mg Per Tube Q6H   feeding supplement  (PROSource TF)  45 mL Per Tube TID   folic acid  1 mg Intravenous Daily   free water  30 mL Per Tube Q4H   hydrocortisone sod succinate (SOLU-CORTEF) inj  100 mg Intravenous Q8H   lactulose  20 g Per Tube TID   multivitamin with minerals  1 tablet Oral Daily   pantoprazole sodium  40 mg Per Tube Daily   senna-docusate  1 tablet Per Tube QHS   [START ON 06/27/2021] thiamine injection  100 mg Intravenous Q24H   Infusions:   sodium chloride 250 mL (06/22/21 2055)   ceFEPime (MAXIPIME) IV 2 g (06/22/21 2317)   epinephrine 10 mcg/min (06/23/21 0214)   feeding supplement (VITAL HIGH PROTEIN) 1,000 mL (06/23/21 0100)  fentaNYL infusion INTRAVENOUS 100 mcg/hr (06/23/21 0215)   heparin 2,000 Units/hr (06/22/21 2159)   metronidazole 500 mg (06/22/21 2035)   midazolam 4 mg/hr (06/22/21 2046)   norepinephrine (LEVOPHED) Adult infusion 40 mcg/min (06/23/21 0219)   sodium chloride (hypertonic) 30 mL/hr at 06/22/21 2020   thiamine injection 500 mg (06/22/21 2348)   Followed by   Derrill Memo ON 06/25/2021] thiamine injection     vancomycin     vasopressin 0.03 Units/min (06/23/21 0329)   PRN: acetaminophen **OR** acetaminophen, fentaNYL, midazolam, ondansetron **OR** ondansetron (ZOFRAN) IV Anti-infectives (From admission, onward)    Start     Dose/Rate Route Frequency Ordered Stop   06/23/21 2200  vancomycin (VANCOREADY) IVPB 1750 mg/350 mL        1,750 mg 175 mL/hr over 120 Minutes Intravenous Every 24 hours 06/22/21 2115     06/22/21 2100  ceFEPIme (MAXIPIME) 2 g in sodium chloride 0.9 % 100 mL IVPB        2 g 200 mL/hr over 30 Minutes Intravenous Every 8 hours 06/22/21 1955     06/22/21 2045  vancomycin (VANCOREADY) IVPB 2000 mg/400 mL        2,000 mg 200 mL/hr over 120 Minutes Intravenous  Once 06/22/21 1955 06/22/21 2255   06/22/21 2000  metroNIDAZOLE (FLAGYL) IVPB 500 mg        500 mg 100 mL/hr over 60 Minutes Intravenous Every 12 hours 06/22/21 1908     06/22/21 1200   Ampicillin-Sulbactam (UNASYN) 3 g in sodium chloride 0.9 % 100 mL IVPB  Status:  Discontinued        3 g 200 mL/hr over 30 Minutes Intravenous Every 6 hours 06/22/21 1007 06/22/21 1908       Assessment: Patient is a 51 y/o M with medical history including obesity, chronic back pain who presented to the ED 5/16 with weakness in setting of abdominal cramps, nausea, vomiting, and diarrhea. Intubated with hypercapnic respiratory failure. Intubated with hypercapnic respiratory failure. New concern for obstructive shock from potential PE. Decline in clinical status with tachycardia, hypotension, and FiO2 requirements of 80-90%.   Baseline labs stable.  Goal of Therapy:  Heparin level 0.3-0.7 units/ml Monitor platelets by anticoagulation protocol: Yes  5/19 0425 HL 0.56, therapeutic x 1   Plan:  Continue heparin infusion at 2000 units/hr Recheck HL in 6 hr to confirm, then daily CBC daily while on heparin  Renda Rolls, PharmD, [facility] 06/23/2021 5:15 AM

## 2021-06-23 NOTE — IPAL (Signed)
  Interdisciplinary Goals of Care Family Meeting   Date carried out: 06/23/2021  Location of the meeting: Bedside  Member's involved: Nurse Practitioner, Bedside Registered Nurse, Family Member or next of kin, and Other: family friend  Durable Power of Forensic psychologist or Loss adjuster, chartered: wife, Robert Coffey   Discussion: We discussed goals of care for Robert Coffey at length. Ms. Solly relayed that the conversation she had with the ICU team this morning was during a whirlwind of information and interventions. Now that she has had time to think it over she would like to change the patient back to FULL CODE. We discussed that while we have seen small improvements, his overall prognosis is still grave and that he remains a high risk for cardiac arrest. Ms. Gabler is understanding of the current situation, all questions and concerns answered at this time.  Code status: Full Code  Disposition: Continue current acute care  Time spent for the meeting: 15 minutes    Huel Cote Rust-Chester, NP  06/23/2021, 7:46 PM  Domingo Pulse Rust-Chester, AGACNP-BC Acute Care Nurse Practitioner [place]   [phone] / [phone] Please see Amion for pager details.

## 2021-06-23 NOTE — Progress Notes (Signed)
[facility]  ROUNDING NOTE   Subjective:    Sedated, intubated 4 Pressors in place, Heparin drip Fentanyl and Versed Foley catheter Tube feeds at 70 mL/h  Sodium 131 UOP 960ml in 24 hours  Objective:  Vital signs in last 24 hours:  Temp:  [99.1 F (37.3 C)-103.5 F (39.7 C)] 100 F (37.8 C) (05/19 1200) Pulse Rate:  [110-130] 111 (05/19 1200) Resp:  [11-26] 22 (05/19 1200) BP: (73-107)/(14-69) 88/31 (05/19 1200) SpO2:  [92 %-99 %] 98 % (05/19 1200) Arterial Line BP: (52-126)/(37-77) 89/53 (05/19 1200) FiO2 (%):  [80 %-90 %] 80 % (05/19 0820) Weight:  [153.6 kg] 153.6 kg (05/19 0900)  Weight change:  Filed Weights   06/07/2021 2005 06/22/21 0800 06/23/21 0900  Weight: (!) 145.1 kg (!) 140.2 kg (!) 153.6 kg    Intake/Output: I/O last 3 completed shifts: In: 3080.5 [I.V.:2526.3; IV Piggyback:554.2] Out: 998 [Urine:915]   Intake/Output this shift:  Total I/O In: 886.2 [I.V.:786.2; IV Piggyback:100] Out: -   Physical Exam: General: critically ill-appearing  Head: Normocephalic, atraumatic  Eyes: Jaundiced  Lungs:  Intubated  Heart: Regular rate and rhythm  Abdomen:  Soft, nontender, obese  Extremities: Trace peripheral edema.  Neurologic: Sedated  Skin: No lesions  Access: None    Basic Metabolic Panel: Recent Labs  Lab 06/16/2021 0629 07/03/2021 1015 06/28/2021 1207 06/15/2021 1408 06/21/21 0331 06/21/21 0920 06/22/21 0408 06/22/21 0731 06/22/21 2030 06/22/21 2220 06/22/21 2350 06/23/21 0425 06/23/21 0717 06/23/21 1140  NA 109*   < > 108*   < > 113*   < > 123*   < > 128* 130* 129* 131* 131*  --   K 4.5  --   --   --  4.1  --  4.1  --   --  4.3  --  4.5  --   --   CL 69*  --   --   --  79*  --  90*  --   --  94*  --  95*  --   --   CO2 23  --   --   --  21*  --  24  --   --  20*  --  15*  --   --   GLUCOSE 108*  --   --   --  86  --  89  --   --  77  --  140*  --   --   BUN 8  --   --   --  15  --  27*  --   --  35*  --  38*  --   --    CREATININE 1.62*  --   --   --  2.08*  --  1.96*  --   --  2.88*  --  3.17*  --   --   CALCIUM 8.5*  --   --   --  8.1*  --  8.3*  --   --  8.3*  --  8.1*  --   --   MG  --   --  1.7  --   --   --  2.4  --   --   --   --  2.2  --   --   PHOS  --   --   --   --   --   --   --   --   --   --   --   --   --  4.4   < > = values in this interval not displayed.     Liver Function Tests: Recent Labs  Lab 07/03/2021 0629 06/23/21 0425  AST 256* 752*  ALT 119* 175*  ALKPHOS 116 107  BILITOT 5.0* 6.3*  PROT 6.8 5.9*  ALBUMIN 3.1* 2.5*    Recent Labs  Lab 06/14/2021 0629  LIPASE 86*    Recent Labs  Lab 06/22/21 1531 06/23/21 1140  AMMONIA 96* 40*     CBC: Recent Labs  Lab 06/24/2021 0629 06/21/21 0331 06/22/21 0408 06/22/21 1857 06/23/21 0425  WBC 11.0* 11.1* 10.2 6.8 19.5*  NEUTROABS 8.1*  --   --   --   --   HGB 15.3 13.7 14.2 14.0 13.9  HCT 41.7 38.2* 40.8 41.8 42.3  MCV 102.2* 102.4* 107.4* 111.5* 116.5*  PLT 129* 107* 135* 132* 133*     Cardiac Enzymes: No results for input(s): CKTOTAL, CKMB, CKMBINDEX, TROPONINI in the last 168 hours.  BNP: Invalid input(s): POCBNP  CBG: Recent Labs  Lab 06/22/21 1206 06/22/21 1529 06/22/21 2223 06/23/21 0714 06/23/21 1112  GLUCAP 105* 70 90 166* 163*     Microbiology: Results for orders placed or performed during the hospital encounter of 06/12/2021  Resp Panel by RT-PCR (Flu A&B, Covid) Nasopharyngeal Swab     Status: None   Collection Time: 06/28/2021  4:08 PM   Specimen: Nasopharyngeal Swab; Nasopharyngeal(NP) swabs in vial transport medium  Result Value Ref Range Status   SARS Coronavirus 2 by RT PCR NEGATIVE NEGATIVE Final    Comment: (NOTE) SARS-CoV-2 target nucleic acids are NOT DETECTED.  The SARS-CoV-2 RNA is generally detectable in upper respiratory specimens during the acute phase of infection. The lowest concentration of SARS-CoV-2 viral copies this assay can detect is 138 copies/mL. A negative result  does not preclude SARS-Cov-2 infection and should not be used as the sole basis for treatment or other patient management decisions. A negative result may occur with  improper specimen collection/handling, submission of specimen other than nasopharyngeal swab, presence of viral mutation(s) within the areas targeted by this assay, and inadequate number of viral copies(<138 copies/mL). A negative result must be combined with clinical observations, patient history, and epidemiological information. The expected result is Negative.  Fact Sheet for Patients:  EntrepreneurPulse.com.au  Fact Sheet for Healthcare Providers:  IncredibleEmployment.be  This test is no t yet approved or cleared by the [place] FDA and  has been authorized for detection and/or diagnosis of SARS-CoV-2 by FDA under an Emergency Use Authorization (EUA). This EUA will remain  in effect (meaning this test can be used) for the duration of the COVID-19 declaration under Section 564(b)(1) of the Act, 21 U.S.C.section 360bbb-3(b)(1), unless the authorization is terminated  or revoked sooner.       Influenza A by PCR NEGATIVE NEGATIVE Final   Influenza B by PCR NEGATIVE NEGATIVE Final    Comment: (NOTE) The Xpert Xpress SARS-CoV-2/FLU/RSV plus assay is intended as an aid in the diagnosis of influenza from Nasopharyngeal swab specimens and should not be used as a sole basis for treatment. Nasal washings and aspirates are unacceptable for Xpert Xpress SARS-CoV-2/FLU/RSV testing.  Fact Sheet for Patients: EntrepreneurPulse.com.au  Fact Sheet for Healthcare Providers: IncredibleEmployment.be  This test is not yet approved or cleared by the [place] FDA and has been authorized for detection and/or diagnosis of SARS-CoV-2 by FDA under an Emergency Use Authorization (EUA). This EUA will remain in effect (meaning this test can be used) for  the duration of the COVID-19 declaration under Section 564(b)(1) of the Act, 21 U.S.C. section 360bbb-3(b)(1), unless the authorization is terminated or revoked.  Performed at [facility], [address] [place], [place] [address]   MRSA Next Gen by PCR, Nasal     Status: None   Collection Time: 06/22/2021  8:23 PM   Specimen: Nasal Mucosa; Nasal Swab  Result Value Ref Range Status   MRSA by PCR Next Gen NOT DETECTED NOT DETECTED Final    Comment: (NOTE) The GeneXpert MRSA Assay (FDA approved for NASAL specimens only), is one component of a comprehensive MRSA colonization surveillance program. It is not intended to diagnose MRSA infection nor to guide or monitor treatment for MRSA infections. Test performance is not FDA approved in patients less than 29 years old. Performed at [facility], [address] [place], [place] [address]   Culture, Respiratory w Gram Stain     Status: None (Preliminary result)   Collection Time: 06/22/21  2:02 PM   Specimen: Tracheal Aspirate; Respiratory  Result Value Ref Range Status   Specimen Description   Final    TRACHEAL ASPIRATE Performed at [facility], [address] [place], [place] [address]    Special Requests   Final    NONE Performed at [facility], [address] [place], [place] [address]    Gram Stain   Final    MODERATE WBC PRESENT, PREDOMINANTLY PMN MODERATE GRAM POSITIVE RODS RARE GRAM POSITIVE COCCI IN CLUSTERS RARE GRAM NEGATIVE RODS    Culture   Final    TOO YOUNG TO READ Performed at [facility], [place] [address] [place], [place] [address]    Report Status PENDING  Incomplete    Coagulation Studies: Recent Labs    06/22/21 05/09/08 06/23/21 0425  LABPROT 18.0* 22.7*  INR 1.5* 2.0*    Urinalysis: No results for input(s): COLORURINE, LABSPEC, PHURINE, GLUCOSEU, HGBUR, BILIRUBINUR, KETONESUR, PROTEINUR, UROBILINOGEN, NITRITE, LEUKOCYTESUR in the last 72  hours.  Invalid input(s): APPERANCEUR     Imaging: DG Chest 1 View  Result Date: 06/23/2021 CLINICAL DATA:  51 year old male with weakness. Metabolic encephalopathy. Intubated. EXAM: CHEST  1 VIEW COMPARISON:  Portable chest 06/22/2021 and earlier. FINDINGS: Portable AP supine view at 0643 hours. Stable ET tube, left IJ central line and enteric tube which courses to the abdomen with tip not included. Left chest pacer or resuscitation pad now in place. Normal cardiac size and mediastinal contours. Left lung markings appear stable. No pneumothorax or pleural effusion on this supine view. But there is new multifocal confluent peribronchial opacity in the right lung, most pronounced at the level of the hilum. No pulmonary edema. Paucity of bowel gas. No acute osseous abnormality identified. IMPRESSION: 1. Stable lines and tubes. 2. New peribronchial opacity in the right lung is nonspecific but suspicious for Pneumonia. Electronically Signed   By: Genevie Ann M.D.   On: 06/23/2021 06:54   DG Abd 1 View  Result Date: 06/22/2021 CLINICAL DATA:  Evaluate OG tube placement. EXAM: ABDOMEN - 1 VIEW COMPARISON:  Chest radiograph 06/22/2021 FINDINGS: Orogastric tube extends into the abdomen. The tip is in the distal gastric body region. Lower lungs are clear. Small amount of bowel gas in the abdomen. IMPRESSION: Orogastric tube in the distal gastric body region. Electronically Signed   By: Markus Daft M.D.   On: 06/22/2021 11:22   DG Chest Port 1 View  Result Date: 06/23/2021 CLINICAL DATA:  Central line  placement. EXAM: PORTABLE CHEST 1 VIEW COMPARISON:  Same day. FINDINGS: Interval placement of right internal jugular catheter with distal tip in expected position of the SVC. No pneumothorax is noted. Otherwise stable support apparatus. IMPRESSION: Interval placement of right internal jugular catheter with distal tip in expected position of the SVC. Electronically Signed   By: Marijo Conception M.D.   On: 06/23/2021  10:00   DG Chest Port 1 View  Result Date: 06/22/2021 CLINICAL DATA:  Intubated. EXAM: PORTABLE CHEST 1 VIEW COMPARISON:  Chest x-ray earlier, same date. FINDINGS: The endotracheal tube is 4 cm above the carina. The NG tube is coursing down the esophagus and into the stomach. The left IJ central venous catheter tip is in the mid SVC. The cardiac silhouette, mediastinal and hilar contours are within normal limits. The lungs are clear. IMPRESSION: 1. Support apparatus in good position without complicating features. 2. No acute cardiopulmonary findings. Electronically Signed   By: Marijo Sanes M.D.   On: 06/22/2021 11:23   DG Chest Port 1 View  Result Date: 06/22/2021 CLINICAL DATA:  Dyspnea. EXAM: PORTABLE CHEST 1 VIEW COMPARISON:  None. FINDINGS: The heart size and mediastinal contours are within normal limits. Both lungs are clear. Hypoinflation of the lungs is noted. The visualized skeletal structures are unremarkable. IMPRESSION: No active disease.  Hypoinflation of the lungs. Electronically Signed   By: Marijo Conception M.D.   On: 06/22/2021 09:57   ECHOCARDIOGRAM COMPLETE  Result Date: 06/23/2021    ECHOCARDIOGRAM REPORT   Patient Name:   ZACKRY DEINES Date of Exam: 06/23/2021 Medical Rec #:  [id number]      Height:       75.0 in Accession #:    [id number]     Weight:       338.6 lb Date of Birth:  Jul 02, 1970       BSA:          2.745 m Patient Age:    34 years       BP:           90/63 mmHg Patient Gender: M              HR:           111 bpm. Exam Location:  [facility] Procedure: 2D Echo, Limited Color Doppler, Cardiac Doppler and Intracardiac            Opacification Agent Indications:     Shock  History:         Patient has no prior history of Echocardiogram examinations.                  Signs/Symptoms:Shortness of Breath. Hx of alcohol use.  Sonographer:     Charmayne Sheer Referring Phys:  [id number] Bradly Bienenstock Diagnosing Phys: Ida Rogue MD  Sonographer Comments: Patient is morbidly obese, echo  performed with patient supine and on artificial respirator and Technically challenging study due to limited acoustic windows. IMPRESSIONS  1. Left ventricular ejection fraction, by estimation, is 60 to 65%. The left ventricle has normal function. The left ventricle has no regional wall motion abnormalities. Left ventricular diastolic parameters are consistent with Grade I diastolic dysfunction (impaired relaxation).  2. Right ventricular systolic function is normal. The right ventricular size is normal.  3. The mitral valve was not well visualized. No evidence of mitral valve regurgitation. No evidence of mitral stenosis.  4. The aortic valve was not well visualized. Aortic valve regurgitation is not visualized. No  aortic stenosis is present. FINDINGS  Left Ventricle: Left ventricular ejection fraction, by estimation, is 60 to 65%. The left ventricle has normal function. The left ventricle has no regional wall motion abnormalities. Definity contrast agent was given IV to delineate the left ventricular  endocardial borders. The left ventricular internal cavity size was normal in size. There is no left ventricular hypertrophy. Left ventricular diastolic parameters are consistent with Grade I diastolic dysfunction (impaired relaxation). Right Ventricle: The right ventricular size is normal. No increase in right ventricular wall thickness. Right ventricular systolic function is normal. Left Atrium: Left atrial size was normal in size. Right Atrium: Right atrial size was normal in size. Pericardium: There is no evidence of pericardial effusion. Mitral Valve: The mitral valve was not well visualized. No evidence of mitral valve regurgitation. No evidence of mitral valve stenosis. MV peak gradient, 3.8 mmHg. The mean mitral valve gradient is 2.0 mmHg. Tricuspid Valve: The tricuspid valve is not well visualized. Tricuspid valve regurgitation is not demonstrated. No evidence of tricuspid stenosis. Aortic Valve: The aortic  valve was not well visualized. Aortic valve regurgitation is not visualized. No aortic stenosis is present. Aortic valve mean gradient measures 7.0 mmHg. Aortic valve peak gradient measures 14.1 mmHg. Aortic valve area, by VTI measures 1.28 cm. Pulmonic Valve: The pulmonic valve was not well visualized. Pulmonic valve regurgitation is not visualized. No evidence of pulmonic stenosis. Aorta: The aortic root is normal in size and structure. Venous: The inferior vena cava was not well visualized. IAS/Shunts: No atrial level shunt detected by color flow Doppler.  LEFT VENTRICLE PLAX 2D LVIDd:         5.80 cm   Diastology LVIDs:         4.22 cm   LV e' medial:    6.64 cm/s LV PW:         0.97 cm   LV E/e' medial:  10.6 LV IVS:        0.82 cm   LV e' lateral:   14.60 cm/s LVOT diam:     2.30 cm   LV E/e' lateral: 4.8 LV SV:         36 LV SV Index:   13 LVOT Area:     4.15 cm  AORTIC VALVE AV Area (Vmax):    1.64 cm AV Area (Vmean):   1.72 cm AV Area (VTI):     1.28 cm AV Vmax:           188.00 cm/s AV Vmean:          123.000 cm/s AV VTI:            0.279 m AV Peak Grad:      14.1 mmHg AV Mean Grad:      7.0 mmHg LVOT Vmax:         74.40 cm/s LVOT Vmean:        51.000 cm/s LVOT VTI:          0.086 m LVOT/AV VTI ratio: 0.31  AORTA Ao Root diam: 3.20 cm MITRAL VALVE MV Area (PHT): 5.13 cm    SHUNTS MV Area VTI:   1.85 cm    Systemic VTI:  0.09 m MV Peak grad:  3.8 mmHg    Systemic Diam: 2.30 cm MV Mean grad:  2.0 mmHg MV Vmax:       0.98 m/s MV Vmean:      64.3 cm/s MV Decel Time: 148 msec MV E velocity: 70.70 cm/s MV A velocity:  83.60 cm/s MV E/A ratio:  0.85 Ida Rogue MD Electronically signed by Ida Rogue MD Signature Date/Time: 06/23/2021/12:13:04 PM    Final      Medications:     prismasol BGK 4/2.5 500 mL/hr at 06/23/21 1039    prismasol BGK 4/2.5 500 mL/hr at 06/23/21 1039   sodium chloride Stopped (06/23/21 0943)   ceFEPime (MAXIPIME) IV     epinephrine 13 mcg/min (06/23/21 1200)   fentaNYL  infusion INTRAVENOUS 100 mcg/hr (06/23/21 1200)   heparin 2,000 Units/hr (06/23/21 1200)   metronidazole Stopped (06/23/21 0903)   midazolam 4 mg/hr (06/23/21 1200)   norepinephrine (LEVOPHED) Adult infusion 40 mcg/min (06/23/21 1200)   phenylephrine (NEO-SYNEPHRINE) Adult infusion 60 mcg/min (06/23/21 1200)   prismasol BGK 4/2.5 2,500 mL/hr at 06/23/21 1042    sodium bicarbonate (isotonic) infusion in sterile water 150 mL/hr at 06/23/21 1200   sodium chloride 0.9 % 250 mL with angiotensin II acetate (GIAPREZA) 2,500,000 ng     thiamine injection Stopped (06/23/21 0552)   Followed by   Derrill Memo ON 06/25/2021] thiamine injection     vancomycin     vasopressin 0.03 Units/min (06/23/21 1200)    Chlorhexidine Gluconate Cloth  6 each Topical Q0600   diazepam  1 mg Per Tube M3T   folic acid  1 mg Intravenous Daily   free water  30 mL Per Tube Q4H   hydrocortisone sod succinate (SOLU-CORTEF) inj  100 mg Intravenous Q8H   lactulose  20 g Per Tube TID   multivitamin with minerals  1 tablet Oral Daily   pantoprazole sodium  40 mg Per Tube Daily   senna-docusate  1 tablet Per Tube QHS   [START ON 06/27/2021] thiamine injection  100 mg Intravenous Q24H   acetaminophen **OR** acetaminophen, fentaNYL, heparin, midazolam, ondansetron **OR** ondansetron (ZOFRAN) IV  Assessment/ Plan:  Mr. Robert Coffey is a 51 y.o.  male with past medical history including alcohol abuse, chronic back pain, and peripheral neuropathy, who was admitted to [facility] on 06/16/2021 for Hyponatremia [E87.1] Elevated liver function tests [R79.89] Generalized weakness [R53.1] AKI (acute kidney injury) ([place]) [D97.4] Acute metabolic encephalopathy [B63.84]   Hyponatremia likely due to poor nutrition and ETOH consumption.Sodium on admission 109.  Patient started on 3% hypertonic solution and transferred to ICU.   Sodium improved to 131 overnight and hypertonic solution stopped. Lasix given.   Acute kidney injury with last known  creatinine baseline of 0.9 on 03/11/2020. Suspected due poor oral intake with a component of IV contrast exposure on 06/29/2021.   Renal function continues to decline, UOP 950ml. Based on worsening renal function and decline in overall health, critical care team and nephrology attending will proceed with CRRT. Orders placed and will begin once access placed  Lab Results  Component Value Date   CREATININE 3.17 (H) 06/23/2021   CREATININE 2.88 (H) 06/22/2021   CREATININE 1.96 (H) 06/22/2021    Intake/Output Summary (Last 24 hours) at 06/23/2021 1246 Last data filed at 06/23/2021 1200 Gross per 24 hour  Intake 3422.73 ml  Output 515 ml  Net 2907.73 ml    3.  Acute metabolic and lactic acidosis. Sodium bicarb started.  Will initiate CRRT and monitor  4. Acute respiratory failure with hypoxia from possible aspiration. Reported increased work of breathing with decreased oxygen saturation. Patient sedated, intubated and placed on ventilator.   Overall prognosis remains very poor    LOS: 3 Jaklyn Alen 5/19/202312:46 PM

## 2021-06-23 NOTE — Consult Note (Signed)
MEDICATION RELATED CONSULT NOTE  Pharmacy Consult for Hypertonic Saline Monitoring Indication: Severe hyponatremia  Patient Measurements: Height: _0  (190.5 cm) Weight: (!) 145.1 kg (319 lb 14.2 oz) IBW/kg (Calculated) : 84.5  Intake/Output from previous day: 05/18 0701 - 05/19 0700 In: 599 [I.V.:617.8; IV Piggyback:254.2] Out: 850 [Urine:850] Intake/Output from this shift: No intake/output data recorded.  Labs: Recent Labs    06/23/2021 1207 06/21/21 0331 06/22/21 0408 06/22/21 1857 06/22/21 2010 06/22/21 2220 06/23/21 0425  WBC  --    < > 10.2 6.8  --   --  19.5*  HGB  --    < > 14.2 14.0  --   --  13.9  HCT  --    < > 40.8 41.8  --   --  42.3  PLT  --    < > 135* 132*  --   --  133*  APTT  --   --   --   --  29  --   --   CREATININE  --    < > 1.96*  --   --  2.88* 3.17*  MG 1.7  --  2.4  --   --   --  2.2  ALBUMIN  --   --   --   --   --   --  2.5*  PROT  --   --   --   --   --   --  5.9*  AST  --   --   --   --   --   --  752*  ALT  --   --   --   --   --   --  175*  ALKPHOS  --   --   --   --   --   --  107  BILITOT  --   --   --   --   --   --  6.3*   < > = values in this interval not displayed.    Estimated Creatinine Clearance: 43.8 mL/min (A) (by C-G formula based on SCr of 3.17 mg/dL (H)).   Medications:  3% NaCl at 30 cc/hr  Assessment: Patient is a 51 y/o M with medical history including obesity, chronic back pain who presented to the ED 5/16 with weakness in setting of abdominal cramps, nausea, vomiting, and diarrhea. Labs on admission notable for Na 109, Cl 69, Scr 1.62, transaminitis with associated hyperbilirubinemia and mildly elevated lipase. Imaging notable for hepatic steatosis. Vitals with hypertension and tachycardia. Patient was initiated on hypertonic saline for severe symptomatic hyponatremia. Pharmacy consulted to assist with monitoring to avoid overcorrection / risk for ODS.   Goal of Therapy:  Increase Na 8 - 12 mmol/L over 24h Notify  provider if Na increases > 4 mmol/L over 2 hours or 6 mmol/L over 4 hours  Plan: Delta of 8 mmol/L over 24 hrs --Will continue to monitor Na and notify provider if above parameters met --Na q4h while on hypertonic saline --Nephrology following; goal sodium of 130 before consideration for discontinuation of hypertonic saline >> Na 131 this AM  Benita Gutter 06/23/2021,8:09 AM

## 2021-06-23 NOTE — Consult Note (Signed)
Pharmacy Antibiotic Note  Robert Coffey is a 51 y.o. male admitted on 06/26/2021 with  acute metabolic encephalopathy  likely secondary to severe hyponatremia and alcohol withdrawal.  Was initially started on IV Unasyn for suspected aspiration, but patient's condition continues to deteriorate. Pharmacy has been consulted for vancomycin and cefepime dosing. Patient is also ordered metronidazole.  Renal failure necessitating initiation of CRRT 5/19  Plan:  Cefepime 2 g IV q12h  Vancomycin 1.5 g (10 mg/kg) IV q24h --Levels at steady state or as clinically indicated  Continue to follow RRT plan per nephrology and adjust antibiotic regimen as appropriate. Follow-up cultures and ability to narrow antibiotics  Height: 6\' 3"  (190.5 cm) Weight: (!) 145.1 kg (319 lb 14.2 oz) IBW/kg (Calculated) : 84.5  Temp (24hrs), Avg:101.6 F (38.7 C), Min:99.6 F (37.6 C), Max:103.5 F (39.7 C)  Recent Labs  Lab 06/05/2021 0629 06/21/21 0331 06/22/21 0408 06/22/21 1855 06/22/21 1857 06/22/21 2220 06/23/21 0425 06/23/21 0716  WBC 11.0* 11.1* 10.2  --  6.8  --  19.5*  --   CREATININE 1.62* 2.08* 1.96*  --   --  2.88* 3.17*  --   LATICACIDVEN  --   --   --  6.5*  --  6.7*  --  >9.0*     Estimated Creatinine Clearance: 43.8 mL/min (A) (by C-G formula based on SCr of 3.17 mg/dL (H)).    Allergies  Allergen Reactions   Sulfa Antibiotics    Antimicrobials this admission: Unasyn 5/18 >> 5/18 Vancomycin 5/18 >> Cefepime 5/18 >> Metronidazole 5/18 >>  Dose adjustments this admission: N/A  Microbiology results: 5/16 MRSA PCR: (-) 5/18 Tracheal aspirate: pending 5/19 Bcx: pending  Thank you for allowing pharmacy to be a part of this patient's care.  Benita Gutter 06/23/2021 9:29 AM

## 2021-06-23 NOTE — Progress Notes (Signed)
*  PRELIMINARY RESULTS* Echocardiogram 2D Echocardiogram has been performed.  Robert Coffey 06/23/2021, 11:17 AM

## 2021-06-23 NOTE — Consult Note (Signed)
[place] for Electrolyte Monitoring and Replacement   Recent Labs: Potassium (mmol/L)  Date Value  06/23/2021 4.5   Magnesium (mg/dL)  Date Value  06/23/2021 2.2   Calcium (mg/dL)  Date Value  06/23/2021 8.1 (L)   Albumin (g/dL)  Date Value  06/23/2021 2.5 (L)   Phosphorus (mg/dL)  Date Value  06/23/2021 4.4   Sodium (mmol/L)  Date Value  06/23/2021 131 (L)   Assessment: Patient is a 51 y/o M with medical history including obesity, chronic back pain, EtOH use disorder who presented to the ED 5/16 with weakness in setting of abdominal cramps, nausea, vomiting, and diarrhea. Patient subsequently admitted for severe symptomatic hyponatremia. Patient decompensated on 5/18 with acute onset respiratory failure requiring intubation. Patient remains admitted to the ICU where he is intubated, sedated, and on mechanical ventilation. There is further concern for septic shock and patient is requiring multiple vasopressor infusions. Admission further complicated by acute renal failure necessitating initiation of CRRT on 5/19. Pharmacy consulted to assist with electrolyte monitoring and replacement as indicated.  Nutrition: Tube feeds are on hold given instability / high dose vasopressor infusions MIVF: Isotonic bicarbonate gtt at 150 cc/hr Cathartics: Lactulose 20 g TID for hepatic encephalopathy  Goal of Therapy:  Electrolytes within normal limits  Plan:  --Na improved to 131 on hypertonic saline which has been subsequently discontinued --Renal function panels ordered BID while on CRRT  Benita Gutter 06/23/2021 2:49 PM

## 2021-06-23 NOTE — Progress Notes (Addendum)
[place] for IV Heparin Indication: pulmonary embolus (suspicion for, not confirmed)  Patient Measurements: Height: 6\' 3"  (190.5 cm) Weight: (!) 153.6 kg (338 lb 10 oz) IBW/kg (Calculated) : 84.5 Heparin Dosing Weight: 117.5 kg  Labs: Recent Labs    06/22/21 0408 06/22/21 1857 06/22/21 2010 06/22/21 2220 06/22/21 2350 06/23/21 0425 06/23/21 1140  HGB 14.2 14.0  --   --   --  13.9  --   HCT 40.8 41.8  --   --   --  42.3  --   PLT 135* 132*  --   --   --  133*  --   APTT  --   --  29  --   --   --   --   LABPROT  --   --  18.0*  --   --  22.7*  --   INR  --   --  1.5*  --   --  2.0*  --   HEPARINUNFRC  --   --   --   --   --  0.56 0.55  CREATININE 1.96*  --   --  2.88*  --  3.17*  --   TROPONINIHS  --   --   --  67* 65*  --   --      Estimated Creatinine Clearance: 45.2 mL/min (A) (by C-G formula based on SCr of 3.17 mg/dL (H)).  Assessment: Patient is a 51 y/o M with medical history including obesity, chronic back pain who presented to the ED 5/16 with weakness in setting of abdominal cramps, nausea, vomiting, and diarrhea. Intubated with hypercapnic respiratory failure. Intubated with hypercapnic respiratory failure. New concern for obstructive shock from potential PE. Decline in clinical status with tachycardia, hypotension, and FiO2 requirements of 80-90%.   Goal of Therapy:  Heparin level 0.3-0.7 units/ml Monitor platelets by anticoagulation protocol: Yes   Plan:  --Heparin level is therapeutic x 2 --Continue heparin infusion at 2000 units/hr --Re-check HL tomorrow AM --Daily CBC per protocol while on IV heparin --Follow-up PE diagnostic testing when appropriate  Benita Gutter  06/23/2021 2:56 PM

## 2021-06-23 NOTE — Procedures (Signed)
Central Venous Catheter Insertion Procedure Note  Gee Habig  [id number]  1970/11/24  Date:06/23/21  Time:12:14 PM   Provider Performing:Antwone Capozzoli Micheline Chapman   Procedure: Insertion of Non-tunneled Central Venous Catheter(36556)with US guidance (37357)    Indication(s) Hemodialysis  Consent Risks of the procedure as well as the alternatives and risks of each were explained to the patient and/or caregiver.  Consent for the procedure was obtained and is signed in the bedside chart  Anesthesia Topical only with 1% lidocaine   Timeout Verified patient identification, verified procedure, site/side was marked, verified correct patient position, special equipment/implants available, medications/allergies/relevant history reviewed, required imaging and test results available.  Sterile Technique Maximal sterile technique including full sterile barrier drape, hand hygiene, sterile gown, sterile gloves, mask, hair covering, sterile ultrasound probe cover (if used).  Procedure Description Area of catheter insertion was cleaned with chlorhexidine and draped in sterile fashion.   With real-time ultrasound guidance a HD catheter was placed into the right internal jugular vein.  Nonpulsatile blood flow and easy flushing noted in all ports.  The catheter was sutured in place and sterile dressing applied.  Complications/Tolerance None; patient tolerated the procedure well. Chest X-ray is ordered to verify placement for internal jugular or subclavian cannulation.  Chest x-ray is not ordered for femoral cannulation.  EBL Minimal  Specimen(s) None  Donell Beers, AGNP  Pulmonary/Critical Care Pager [phone] (please enter 7 digits) PCCM Consult Pager [phone] (please enter 7 digits)

## 2021-06-23 NOTE — Progress Notes (Addendum)
NAME:  Robert Coffey, MRN:  [id number], DOB:  October 07, 1970, LOS: 3 ADMISSION DATE:  06/05/2021, CONSULTATION DATE:  06/22/2021 REFERRING MD:  Dr. Jimmye Norman, CHIEF COMPLAINT:  Acute Respiratory Distress, AMS   Brief Pt Description / Synopsis:  51 year old male admitted with acute metabolic encephalopathy secondary to alcohol withdrawal and severe hyponatremia requiring hypertonic saline.  On 5/18 developed acute hypoxic & hypercapnic respiratory failure requiring intubation and mechanical ventilation.  History of Present Illness:  Robert Coffey is a 51 year old male with a past medical history significant for alcohol abuse, obesity, chronic back pain, peripheral neuropathy who presented to [facility] ED on 07/02/2021 due to altered mental status, poor p.o. intake, progressive weakness with difficulty getting around at home.  Patient is now intubated and unable to contribute to history, therefore history is obtained from patient's wife at bedside and chart review.  Per the patient's wife, the patient has not been feeling well with poor p.o. intake for approximately a month, however has continued to drink alcohol daily (usually 3-4 drinks of liquor) during that time.  Over the past several days he has become progressively weak with difficulty getting around at home (not even in bed able to get out of bed), intermittent confusion, and diffuse abdominal cramping/nausea/vomiting/diarrhea.  Denies any history of falls, chest pain, shortness of breath, fever, cough.  Last known alcoholic drink was the day prior to admission on Sunday.  ED Course: Initial Vital Signs: Temperature 98.1 F orally, blood pressure 136/119, respiratory rate 18, pulse 106, SPO2 95% Significant Labs: Sodium 109, chloride 69, glucose 108, BUN 8, creatinine 1.62, anion gap 17, albumin 3.1, lipase 86, AST 256, ALT 119, total bilirubin 5.0, high-sensitivity troponin 67, serum osmolality 236, urine osmolality 437, urine sodium less than 10 WBC  11.0, platelets 129, TSH 2.1 COVID-19 and influenza PCR negative Urinalysis negative for UTI Urine drug screen positive for tricyclics Ethyl alcohol less than 10 Imaging CT head without contrast>>IMPRESSION: 1. Fluid or debris in the left sinus tympani/middle ear, cannot exclude otitis media. There small bilateral mastoid effusions and chronic bilateral maxillary sinusitis. 2. Advanced for age cerebral atrophy. 3. Atherosclerosis. CT abdomen and pelvis>>IMPRESSION: Hepatic steatosis. 2.6 cm enhancing left adrenal nodule is noted. When the patient is clinically stable and able to follow directions and hold their breath (preferably as an outpatient) further evaluation with dedicated abdominal MRI should be considered. Medications Administered: 1 L normal saline bolus, hypertonic saline infusion  He was admitted by the hospitalist for further work-up and treatment of acute metabolic encephalopathy in the setting of DTs and severe hyponatremia.  Please see "significant hospital events" section below for full detailed hospital course.  Pertinent  Medical History  Alcohol abuse Obesity Chronic back  Micro Data:  5/16: SARS-CoV-2 and influenza PCR>> negative 5/16: HIV screen>> nonreactive 5/16: MRSA PCR>> negative 5/18: Tracheal aspirate>> 5/19: Blood x2>>  Antimicrobials:  5/18: Unasyn>>x2 doses  5/18: Cefepime>> 5/18: Vancomycin>>  Significant Hospital Events: Including procedures, antibiotic start and stop dates in addition to other pertinent events   5/16: Admitted by hospitalist for acute metabolic encephalopathy in setting of severe hyponatremia and DTs.  Requiring hypertonic saline 5/18: Developed acute hypoxic respiratory failure, possible concern for aspiration.  Required intubation and mechanical ventilation. 5/19: Pt with worsening acute renal failure with severe metabolic and lactic acidosis requiring CRRT.  Severely hypotensive requiring epinephrine, levophed, and  vasopressin gtts to maintain map >65   Interim History / Subjective:  Pt remains mechanically intubated FiO2 80% and PEEP 10 requiring levophed,  vasopressin, and epinephrine gtts to maintain map >65  Objective   Blood pressure (!) 83/23, pulse (!) 114, temperature 99.7 F (37.6 C), resp. rate 13, height $RemoveBe'6\' 3"'jVYxSiFhk$  (1.905 m), weight (!) 153.6 kg, SpO2 97 %. CVP:  [18 mmHg] 18 mmHg  Vent Mode: PRVC FiO2 (%):  [80 %-90 %] 80 % Set Rate:  [16 bmp-22 bmp] 22 bmp Vt Set:  [650 mL] 650 mL PEEP:  [10 cmH20] 10 cmH20 Plateau Pressure:  [20 cmH20-24 cmH20] 20 cmH20   Intake/Output Summary (Last 24 hours) at 06/23/2021 1031 Last data filed at 06/23/2021 0800 Gross per 24 hour  Intake 2740.69 ml  Output 515 ml  Net 2225.69 ml   Filed Weights   06/08/2021 2005 06/22/21 0800 06/23/21 0900  Weight: (!) 145.1 kg (!) 140.2 kg (!) 153.6 kg    Examination: General: Critically ill-appearing obese male, laying in bed, NAD mechanically intubated  HENT: Atraumatic, normocephalic, neck supple, difficult to assess JVD due to body habitus Lungs: Coarse breath sounds bilaterally, tachypnea, increased work of breathing Cardiovascular: Tachycardia, rrr, s1-s2, no murmurs, rubs, gallops, 2+ generalized edema  Abdomen: +BS x4, obese, soft, mild distension  Extremities: Normal bulk and tone Neuro: Sedated, not following commands, PERRL  GU: Indwelling foley catheter draining yellow urine   Resolved Hospital Problem list     Assessment & Plan:  Acute hypoxic & hypercapnic respiratory failure in the setting of suspected aspiration in the setting of severe DTs -Full vent support, implement lung protective strategies -Plateau pressures less than 30 cm H20 -Wean FiO2 & PEEP as tolerated to maintain O2 sats >92% -Follow intermittent Chest X-ray & ABG as needed -Spontaneous Breathing Trials when respiratory parameters met and mental status permits -Implement VAP Bundle -Prn Bronchodilators -Continue abx as  outlined above  -Treating empirically with heparin gtt for possible PE (unable to obtain CTA Chest due to AKI)  Severe sepsis with septic shock  Elevated troponin suspect secondary to demand ischemia -Continuous telemetry monitoring -Trend troponin's until peaked -Prn levophed, neo-synephrine, epinephrine, and vasopressin gtts to maintain map >65 -Echo pending   Suspected aspiration CT head 5/16 unable to exclude left otitis media and chronic bilateral maxillary sinusitis -Trend WBC and monitor fever curve  -Trend PCT  -Follow cultures as above -Continue cefepime and vancomycin   Hypotonic hyponatremia, suspect secondary to dehydration & poor p.o. intake~improving  Acute kidney injury secondary to ATN with severe metabolic acidosis  Lactic acidosis  -Hypertonic saline discontinued  -Trend BMP, lactic acid, and ABG -Replace electrolytes as indicated  -Monitor UOP -Nephrology consulted appreciate input: will start CRRT  -Sodium bicarb gtt $RemoveBe'@150'HnePZSTth$  ml/hr will stop once CRRT initiated   Transaminitis secondary to hepatic steatosis and ETOH abuse  -Trend LFTs and coags -AFP tumor marker pending   Thrombocytopenia, suspect secondary to alcohol abuse -Trend CBC -Monitor for s/sx of bleeding and transfuse for Hgb <7 -Transfuse platelets for platelet count less than 50 with active bleeding  Incidental finding of left adrenal nodule via CT Abd/Pelvis  -Will need abdominal MRI for further evaluation in the outpatient setting   Acute metabolic encephalopathy in the setting of severe DTs and severe hyponatremia Sedation needs in the setting of mechanical ventilation -Maintain a RASS goal of 0 to -1 -Fentanyl and versed as needed to maintain RASS goal -Avoid sedating medications as able -Daily wake up assessment -High-dose thiamine x3 days -Continue folic acid and multivitamin -Continue CIWA protocol -Continue lactulose and trend ammonia level   Best Practice (right click and  "  Reselect all SmartList Selections" daily)  Diet/type: NPO DVT prophylaxis: Heparin gtt  GI prophylaxis: PPI Lines: Central line Foley:  Yes, and it is still needed Code Status: DNR Last date of multidisciplinary goals of care discussion [06/23/21]  Updated pts wife at bedside regarding decline in pts condition and overall prognosis.  Following discussion pts wife decided to change pts code status to DNR/DNI  Labs   CBC: Recent Labs  Lab 06/11/2021 0629 06/21/21 0331 06/22/21 0408 06/22/21 1857 06/23/21 0425  WBC 11.0* 11.1* 10.2 6.8 19.5*  NEUTROABS 8.1*  --   --   --   --   HGB 15.3 13.7 14.2 14.0 13.9  HCT 41.7 38.2* 40.8 41.8 42.3  MCV 102.2* 102.4* 107.4* 111.5* 116.5*  PLT 129* 107* 135* 132* 133*    Basic Metabolic Panel: Recent Labs  Lab 06/12/2021 0629 06/25/2021 1015 06/10/2021 1207 06/11/2021 1408 06/21/21 0331 06/21/21 0920 06/22/21 0408 06/22/21 0731 06/22/21 2030 06/22/21 2220 06/22/21 2350 06/23/21 0425 06/23/21 0717  NA 109*   < > 108*   < > 113*   < > 123*   < > 128* 130* 129* 131* 131*  K 4.5  --   --   --  4.1  --  4.1  --   --  4.3  --  4.5  --   CL 69*  --   --   --  79*  --  90*  --   --  94*  --  95*  --   CO2 23  --   --   --  21*  --  24  --   --  20*  --  15*  --   GLUCOSE 108*  --   --   --  86  --  89  --   --  77  --  140*  --   BUN 8  --   --   --  15  --  27*  --   --  35*  --  38*  --   CREATININE 1.62*  --   --   --  2.08*  --  1.96*  --   --  2.88*  --  3.17*  --   CALCIUM 8.5*  --   --   --  8.1*  --  8.3*  --   --  8.3*  --  8.1*  --   MG  --   --  1.7  --   --   --  2.4  --   --   --   --  2.2  --    < > = values in this interval not displayed.   GFR: Estimated Creatinine Clearance: 45.2 mL/min (A) (by C-G formula based on SCr of 3.17 mg/dL (H)). Recent Labs  Lab 06/21/21 0331 06/22/21 0408 06/22/21 1855 06/22/21 1857 06/22/21 2220 06/23/21 0425 06/23/21 0716 06/23/21 0717  PROCALCITON  --   --   --   --   --   --   --  16.69   WBC 11.1* 10.2  --  6.8  --  19.5*  --   --   LATICACIDVEN  --   --  6.5*  --  6.7*  --  >9.0*  --     Liver Function Tests: Recent Labs  Lab 06/06/2021 0629 06/23/21 0425  AST 256* 752*  ALT 119* 175*  ALKPHOS 116 107  BILITOT 5.0* 6.3*  PROT 6.8 5.9*  ALBUMIN 3.1*  2.5*   Recent Labs  Lab 06/15/2021 0629  LIPASE 86*   Recent Labs  Lab 06/22/21 1531  AMMONIA 96*    ABG    Component Value Date/Time   PHART 7.03 (LL) 06/23/2021 0717   PCO2ART 47 06/23/2021 0717   PO2ART 112 (H) 06/23/2021 0717   HCO3 12.4 (L) 06/23/2021 0717   ACIDBASEDEF 18.2 (H) 06/23/2021 0717   O2SAT 99.6 06/23/2021 0717     Coagulation Profile: Recent Labs  Lab 06/22/21 2010 06/23/21 0425  INR 1.5* 2.0*    Cardiac Enzymes: No results for input(s): CKTOTAL, CKMB, CKMBINDEX, TROPONINI in the last 168 hours.  HbA1C: No results found for: HGBA1C  CBG: Recent Labs  Lab 06/09/2021 2004 06/22/21 1206 06/22/21 1529 06/22/21 2223 06/23/21 0714  GLUCAP 99 105* 70 90 166*    Review of Systems:   Unable to assess due to AMS & Acute Respiratory Distress   Past Medical History:  He,  has no past medical history on file.   Surgical History:  History reviewed. No pertinent surgical history.   Social History:      Family History:  His family history is not on file.   Allergies Allergies  Allergen Reactions   Sulfa Antibiotics      Home Medications  Prior to Admission medications   Medication Sig Start Date End Date Taking? Authorizing Provider  atorvastatin (LIPITOR) 20 MG tablet Take 20 mg by mouth daily. 02/03/21  Yes [provider]  cyanocobalamin 1000 MCG tablet Take by mouth.   Yes [provider]  hydrochlorothiazide (HYDRODIURIL) 25 MG tablet Take 25 mg by mouth daily. 05/30/21  Yes [provider]  lisinopril (ZESTRIL) 40 MG tablet Take 40 mg by mouth daily. 05/04/21  Yes [provider]  rOPINIRole (REQUIP) 0.25 MG tablet Take 0.25  mg by mouth 3 (three) times daily. 07/28/20  Yes [provider]  vitamin C (ASCORBIC ACID) 500 MG tablet Take 500 mg by mouth daily.   Yes [provider]  zolpidem (AMBIEN) 10 MG tablet Take 10 mg by mouth at bedtime as needed. 06/07/21  Yes [provider]     Critical care time: 60 minutes    Donell Beers, [place] Pager [phone] (please enter 7 digits) PCCM Consult Pager [phone] (please enter 7 digits)

## 2021-06-23 NOTE — Progress Notes (Signed)
Nutrition Follow Up Note   DOCUMENTATION CODES:   Obesity unspecified  INTERVENTION:   Once appropriate to resume tube feeds:  Vital HP $Remove'@85ml'pKsXTNY$ /hr- Initiate at 43ml/hr and increase by 100ml/hr q 8 hours until goal rate is reached.   Free water flushes 73ml q4 hours to maintain tube patency   Regimen provides 2040kcal/day, 179g/day protein and 1864ml/day of free water   Rena-vit daily via tube   NUTRITION DIAGNOSIS:   Inadequate oral intake related to inability to eat (pt sedated and ventilated) as evidenced by NPO status. -ongoing   GOAL:   Provide needs based on ASPEN/SCCM guidelines -not met   MONITOR:   Vent status, Labs, Weight trends, TF tolerance, Skin, I & O's  ASSESSMENT:   51 y/o male with h/o etoh abuse, OSA and DDD who is admitted with alcohol withdrawal, encephalopathy, AKI and aspiration requiring intubation.  Pt remains sedated and ventilated. OGT in place. Pt tolerated tube feeds overnight but tube feeds discontinued this morning as pt with severe hypotension requiring numerous pressors. Pt with worsening acute renal failure with severe metabolic and lactic acidosis requiring initiation of CRRT today. Per chart pt is up ~19lbs since admission. Pt +3.9L on his I & Os. Will plan to resume tube feeds once pt is more stable.   Medications reviewed and include: folic acid, solu-cortef, lactulose, MVI, protonix, thiamine, cefepime, epinephrine, heparin, metronidazole, levophed, phenylephrine, Na Bicarbonate, giapreza, thiamine, vancomycin, vasopressin   Labs reviewed: Na 131(L), BUN 38(H), creat 3.17(H), Mg 2.2 wnl Lipase- 86(H)- 5/16 Ammonia- 96(H)- 5/18 Wbc- 19.5(H) Cbgs- 163, 166 x 24 hrs  Patient is currently intubated on ventilator support MV: 16.5 L/min Temp (24hrs), Avg:101.1 F (38.4 C), Min:99.1 F (37.3 C), Max:103.5 F (39.7 C)  Propofol: none   MAP- < 59mmHg   UOP- 950ml   Diet Order:   Diet Order             Diet NPO time specified   Diet effective now                  EDUCATION NEEDS:   No education needs have been identified at this time  Skin:  Skin Assessment: Reviewed RN Assessment  Last BM:  5/16  Height:   Ht Readings from Last 1 Encounters:  06/25/2021 $RemoveB'6\' 3"'ereLplWM$  (1.905 m)    Weight:   Wt Readings from Last 1 Encounters:  06/23/21 (!) 153.6 kg    Ideal Body Weight:  89 kg  BMI:  Body mass index is 42.33 kg/m.  Estimated Nutritional Needs:   Kcal:  1596-2031kcal/day  Protein:  >178g/day  Fluid:  2.7-3.0L/day  Koleen Distance MS, RD, LDN Please refer to [facility] for RD and/or RD on-call/weekend/after hours pager

## 2021-06-24 DIAGNOSIS — G9341 Metabolic encephalopathy: Secondary | ICD-10-CM | POA: Diagnosis not present

## 2021-06-24 LAB — GLUCOSE, CAPILLARY
Glucose-Capillary: 114 mg/dL — ABNORMAL HIGH (ref 70–99)
Glucose-Capillary: 117 mg/dL — ABNORMAL HIGH (ref 70–99)
Glucose-Capillary: 131 mg/dL — ABNORMAL HIGH (ref 70–99)
Glucose-Capillary: 123 mg/dL — ABNORMAL HIGH (ref 70–99)
Glucose-Capillary: 112 mg/dL — ABNORMAL HIGH (ref 70–99)
Glucose-Capillary: 114 mg/dL — ABNORMAL HIGH (ref 70–99)

## 2021-06-24 LAB — RENAL FUNCTION PANEL
Albumin: 2.4 g/dL — ABNORMAL LOW (ref 3.5–5.0)
Albumin: 2.2 g/dL — ABNORMAL LOW (ref 3.5–5.0)
Anion gap: 12 (ref 5–15)
Anion gap: 11 (ref 5–15)
BUN: 26 mg/dL — ABNORMAL HIGH (ref 6–20)
BUN: 23 mg/dL — ABNORMAL HIGH (ref 6–20)
CO2: 22 mmol/L (ref 22–32)
CO2: 22 mmol/L (ref 22–32)
Calcium: 7.8 mg/dL — ABNORMAL LOW (ref 8.9–10.3)
Calcium: 7.6 mg/dL — ABNORMAL LOW (ref 8.9–10.3)
Chloride: 101 mmol/L (ref 98–111)
Chloride: 102 mmol/L (ref 98–111)
Creatinine, Ser: 2.24 mg/dL — ABNORMAL HIGH (ref 0.61–1.24)
Creatinine, Ser: 1.97 mg/dL — ABNORMAL HIGH (ref 0.61–1.24)
GFR, Estimated: 35 mL/min — ABNORMAL LOW (ref >60)
GFR, Estimated: 41 mL/min — ABNORMAL LOW (ref >60)
Glucose, Bld: 121 mg/dL — ABNORMAL HIGH (ref 70–99)
Glucose, Bld: 117 mg/dL — ABNORMAL HIGH (ref 70–99)
Phosphorus: 2.1 mg/dL — ABNORMAL LOW (ref 2.5–4.6)
Phosphorus: 2.1 mg/dL — ABNORMAL LOW (ref 2.5–4.6)
Potassium: 4.3 mmol/L (ref 3.5–5.1)
Potassium: 4.7 mmol/L (ref 3.5–5.1)
Sodium: 135 mmol/L (ref 135–145)
Sodium: 135 mmol/L (ref 135–145)

## 2021-06-24 LAB — BLOOD GAS, ARTERIAL
Acid-base deficit: 3.1 mmol/L — ABNORMAL HIGH (ref 0.0–2.0)
Bicarbonate: 22 mmol/L (ref 20.0–28.0)
FIO2: 60 %
MECHVT: 650 mL
O2 Saturation: 100 %
PEEP: 10 cmH2O
Patient temperature: 37
RATE: 22 resp/min
pCO2 arterial: 39 mmHg (ref 32–48)
pH, Arterial: 7.36 (ref 7.35–7.45)
pO2, Arterial: 121 mmHg — ABNORMAL HIGH (ref 83–108)

## 2021-06-24 LAB — HEPATIC FUNCTION PANEL
ALT: 280 U/L — ABNORMAL HIGH (ref 0–44)
AST: 1148 U/L — ABNORMAL HIGH (ref 15–41)
Albumin: 2.4 g/dL — ABNORMAL LOW (ref 3.5–5.0)
Alkaline Phosphatase: 138 U/L — ABNORMAL HIGH (ref 38–126)
Bilirubin, Direct: 4.9 mg/dL — ABNORMAL HIGH (ref 0.0–0.2)
Indirect Bilirubin: 3.1 mg/dL — ABNORMAL HIGH (ref 0.3–0.9)
Total Bilirubin: 8 mg/dL — ABNORMAL HIGH (ref 0.3–1.2)
Total Protein: 6 g/dL — ABNORMAL LOW (ref 6.5–8.1)

## 2021-06-24 LAB — CBC
HCT: 42 % (ref 39.0–52.0)
Hemoglobin: 13.9 g/dL (ref 13.0–17.0)
MCH: 37.3 pg — ABNORMAL HIGH (ref 26.0–34.0)
MCHC: 33.1 g/dL (ref 30.0–36.0)
MCV: 112.6 fL — ABNORMAL HIGH (ref 80.0–100.0)
Platelets: 117 10*3/uL — ABNORMAL LOW (ref 150–400)
RBC: 3.73 MIL/uL — ABNORMAL LOW (ref 4.22–5.81)
RDW: 17.5 % — ABNORMAL HIGH (ref 11.5–15.5)
WBC: 17.4 10*3/uL — ABNORMAL HIGH (ref 4.0–10.5)
nRBC: 4 % — ABNORMAL HIGH (ref 0.0–0.2)

## 2021-06-24 LAB — PROCALCITONIN: Procalcitonin: 7.12 ng/mL

## 2021-06-24 LAB — AFP TUMOR MARKER: AFP, Serum, Tumor Marker: 3.3 ng/mL (ref 0.0–6.9)

## 2021-06-24 LAB — VANCOMYCIN, RANDOM: Vancomycin Rm: 12

## 2021-06-24 LAB — HEPARIN LEVEL (UNFRACTIONATED)
Heparin Unfractionated: 0.75 IU/mL — ABNORMAL HIGH (ref 0.30–0.70)
Heparin Unfractionated: 0.7 IU/mL (ref 0.30–0.70)
Heparin Unfractionated: 0.58 IU/mL (ref 0.30–0.70)

## 2021-06-24 LAB — LACTIC ACID, PLASMA: Lactic Acid, Venous: 4.1 mmol/L (ref 0.5–1.9)

## 2021-06-24 LAB — MAGNESIUM: Magnesium: 2.4 mg/dL (ref 1.7–2.4)

## 2021-06-24 MED ORDER — PRISMASOL BGK 0/2.5 32-2.5 MEQ/L EC SOLN
Status: DC
  Filled 2021-06-24 (×7): qty 5000

## 2021-06-24 MED ORDER — POTASSIUM CHLORIDE 2 MEQ/ML IV SOLN
INTRAVENOUS | Status: DC
  Filled 2021-06-24 (×7): qty 5000

## 2021-06-24 MED ORDER — POTASSIUM & SODIUM PHOSPHATES 280-160-250 MG PO PACK
2.0000 | PACK | Freq: Two times a day (BID) | ORAL | Status: AC
  Administered 2021-06-24 (×2): 2
  Filled 2021-06-24 (×2): qty 2

## 2021-06-24 MED ORDER — ONDANSETRON HCL 4 MG PO TABS
4.0000 mg | ORAL_TABLET | Freq: Four times a day (QID) | ORAL | Status: DC | PRN
Start: 2021-06-24 — End: 2021-07-08

## 2021-06-24 MED ORDER — PRISMASOL BGK 0/2.5 32-2.5 MEQ/L EC SOLN
Status: DC
  Filled 2021-06-24 (×34): qty 5000

## 2021-06-24 MED ORDER — ACETAMINOPHEN 650 MG RE SUPP
650.0000 mg | Freq: Four times a day (QID) | RECTAL | Status: DC | PRN
  Filled 2021-06-24: qty 1

## 2021-06-24 MED ORDER — ONDANSETRON HCL 4 MG/2ML IJ SOLN: 4.0000 mg | Freq: Four times a day (QID) | INTRAMUSCULAR | Status: DC | PRN

## 2021-06-24 MED ORDER — ADULT MULTIVITAMIN W/MINERALS CH
1.0000 | ORAL_TABLET | Freq: Every day | ORAL | Status: DC
  Administered 2021-06-25 – 2021-07-04 (×10): 1
  Filled 2021-06-24 (×10): qty 1

## 2021-06-24 MED ORDER — ACETAMINOPHEN 325 MG PO TABS
650.0000 mg | ORAL_TABLET | Freq: Four times a day (QID) | ORAL | Status: DC | PRN
  Administered 2021-06-29 – 2021-07-05 (×6): 650 mg
  Filled 2021-06-24 (×6): qty 2

## 2021-06-24 NOTE — Progress Notes (Signed)
[place] for IV Heparin Indication: pulmonary embolus (suspicion for, not confirmed)  Patient Measurements: Height: 6\' 3"  (190.5 cm) Weight: (!) 153.8 kg (339 lb 1.1 oz) IBW/kg (Calculated) : 84.5 Heparin Dosing Weight: 117.5 kg  Labs: Recent Labs    06/22/21 1857 06/22/21 2010 06/22/21 2220 06/22/21 2350 06/23/21 0425 06/23/21 1140 06/23/21 1519 06/24/21 0338  HGB 14.0  --   --   --  13.9  --   --  13.9  HCT 41.8  --   --   --  42.3  --   --  42.0  PLT 132*  --   --   --  133*  --   --  117*  APTT  --  29  --   --   --   --   --   --   LABPROT  --  18.0*  --   --  22.7*  --   --   --   INR  --  1.5*  --   --  2.0*  --   --   --   HEPARINUNFRC  --   --   --   --  0.56 0.55  --  0.75*  CREATININE  --   --  2.88*  --  3.17*  --  2.96* 2.24*  TROPONINIHS  --   --  67* 65*  --   --   --   --      Estimated Creatinine Clearance: 64 mL/min (A) (by C-G formula based on SCr of 2.24 mg/dL (H)).  Assessment: Patient is a 51 y/o M with medical history including obesity, chronic back pain who presented to the ED 5/16 with weakness in setting of abdominal cramps, nausea, vomiting, and diarrhea. Intubated with hypercapnic respiratory failure. Intubated with hypercapnic respiratory failure. New concern for obstructive shock from potential PE. Decline in clinical status with tachycardia, hypotension, and FiO2 requirements of 80-90%.   5/20 0338 HL 0.75   Goal of Therapy:  Heparin level 0.3-0.7 units/ml Monitor platelets by anticoagulation protocol: Yes   Plan:  Heparin level is supratherapeutic. Will decrease heparin infusion to 1800 units/hr. Recheck heparin level in 6 hours. CBC daily while on heparin.    Robert Coffey  06/24/2021 7:15 AM

## 2021-06-24 NOTE — Progress Notes (Signed)
ANTICOAGULATION CONSULT NOTE   Pharmacy Consult for IV Heparin Indication: pulmonary embolus (suspicion for, not confirmed)  Patient Measurements: Height: 6\' 3"  (190.5 cm) Weight: (!) 153.8 kg (339 lb 1.1 oz) IBW/kg (Calculated) : 84.5 Heparin Dosing Weight: 117.5 kg  Labs: Recent Labs    06/22/21 1857 06/22/21 2010 06/22/21 2220 06/22/21 2350 06/23/21 0425 06/23/21 1140 06/23/21 1519 06/24/21 0338 06/24/21 1406 06/24/21 2130  HGB 14.0  --   --   --  13.9  --   --  13.9  --   --   HCT 41.8  --   --   --  42.3  --   --  42.0  --   --   PLT 132*  --   --   --  133*  --   --  117*  --   --   APTT  --  29  --   --   --   --   --   --   --   --   LABPROT  --  18.0*  --   --  22.7*  --   --   --   --   --   INR  --  1.5*  --   --  2.0*  --   --   --   --   --   HEPARINUNFRC  --   --   --   --  0.56   < >  --  0.75* 0.70 0.58  CREATININE  --   --  2.88*  --  3.17*  --  2.96* 2.24* 1.97*  --   TROPONINIHS  --   --  67* 65*  --   --   --   --   --   --    < > = values in this interval not displayed.   Heparin Dosing Weight: 117.5 kg  Estimated Creatinine Clearance: 72.8 mL/min (A) (by C-G formula based on SCr of 1.97 mg/dL (H)).  Assessment: Patient is a 51 y/o M with medical history including obesity, chronic back pain who presented to the ED 5/16 with weakness in setting of abdominal cramps, nausea, vomiting, and diarrhea. Intubated with hypercapnic respiratory failure. Intubated with hypercapnic respiratory failure. New concern for obstructive shock from potential PE. Decline in clinical status with tachycardia, hypotension, and FiO2 requirements of 80-90%.   Date Time  HL Rate/Comment  5/20 0338 0.75  Supratherapeutic; 2000 > 1800 un/hr 5/20 1406 0.70 Therapeutic at ULN; 1800 > 1750 un/hr 5/20 2130 0.58 Therapeutic x 2      Goal of Therapy:  Heparin level 0.3-0.7 units/ml Monitor platelets by anticoagulation protocol: Yes   Plan:  Heparin level is therapeutic x  2 Continue heparin infusion at 1750 units/hr.  Recheck heparin daily with AM labs CBC daily while on heparin.   Renda Rolls, PharmD, MBA 06/24/2021 11:08 PM

## 2021-06-24 NOTE — Progress Notes (Signed)
[place] for IV Heparin Indication: pulmonary embolus (suspicion for, not confirmed)  Patient Measurements: Height: 6\' 3"  (190.5 cm) Weight: (!) 153.8 kg (339 lb 1.1 oz) IBW/kg (Calculated) : 84.5 Heparin Dosing Weight: 117.5 kg  Labs: Recent Labs    06/22/21 1857 06/22/21 1857 06/22/21 2010 06/22/21 2220 06/22/21 2350 06/23/21 0425 06/23/21 1140 06/23/21 1519 06/24/21 0338 06/24/21 1406  HGB 14.0  --   --   --   --  13.9  --   --  13.9  --   HCT 41.8  --   --   --   --  42.3  --   --  42.0  --   PLT 132*  --   --   --   --  133*  --   --  117*  --   APTT  --   --  29  --   --   --   --   --   --   --   LABPROT  --   --  18.0*  --   --  22.7*  --   --   --   --   INR  --   --  1.5*  --   --  2.0*  --   --   --   --   HEPARINUNFRC  --    < >  --   --   --  0.56 0.55  --  0.75* 0.70  CREATININE  --   --   --  2.88*  --  3.17*  --  2.96* 2.24*  --   TROPONINIHS  --   --   --  67* 65*  --   --   --   --   --    < > = values in this interval not displayed.   Heparin Dosing Weight: 117.5 kg  Estimated Creatinine Clearance: 64 mL/min (A) (by C-G formula based on SCr of 2.24 mg/dL (H)).  Assessment: Patient is a 51 y/o M with medical history including obesity, chronic back pain who presented to the ED 5/16 with weakness in setting of abdominal cramps, nausea, vomiting, and diarrhea. Intubated with hypercapnic respiratory failure. Intubated with hypercapnic respiratory failure. New concern for obstructive shock from potential PE. Decline in clinical status with tachycardia, hypotension, and FiO2 requirements of 80-90%.   Date Time  HL Rate/Comment  5/20 0338 0.75  Supratherapeutic; 2000 > 1800 un/hr 5/20 1406 0.70 Therapeutic at ULN; 1800 > 1750 un/hr      Goal of Therapy:  Heparin level 0.3-0.7 units/ml Monitor platelets by anticoagulation protocol: Yes   Plan:  Heparin level is therapeutic but at ULN, will reduce rate slightly. Will  decrease heparin infusion to 1750 units/hr.  Recheck heparin level in 6 hours.  CBC daily while on heparin.   Lorna Dibble, PharmD, [facility] Clinical Pharmacist 06/24/2021 3:13 PM

## 2021-06-24 NOTE — Progress Notes (Signed)
[facility]  PROGRESS NOTE   Subjective:   Patient seen in the ICU.  On the ventilator.  CRRT in progress and has been tolerating well. Slowly being weaned off pressors.  Patient's wife at bedside.  Objective:  Vital signs: Blood pressure 121/79, pulse 87, temperature 99 F (37.2 C), resp. rate (!) 22, height $RemoveBe'6\' 3"'NEWqLhDoB$  (1.905 m), weight (!) 153.8 kg, SpO2 97 %.  Intake/Output Summary (Last 24 hours) at 06/24/2021 1603 Last data filed at 06/24/2021 1600 Gross per 24 hour  Intake 3403.92 ml  Output 5335 ml  Net -1931.08 ml   Filed Weights   06/22/21 0800 06/23/21 0900 06/24/21 0321  Weight: (!) 140.2 kg (!) 153.6 kg (!) 153.8 kg     Physical Exam: General:  No acute distress  Head:  Normocephalic, atraumatic. Moist oral mucosal membranes  Eyes:  Anicteric  Neck:  Supple  Lungs:   Clear to auscultation, normal effort  Heart:  S1S2 no rubs  Abdomen:   Soft, nontender, bowel sounds present  Extremities:  peripheral edema.  Neurologic: Vented and sedated.  Skin:  No lesions  Access:     Basic Metabolic Panel: Recent Labs  Lab 06/19/2021 1207 06/25/2021 1408 06/22/21 0408 06/22/21 0731 06/22/21 2220 06/22/21 2350 06/23/21 0425 06/23/21 0717 06/23/21 1140 06/23/21 1519 06/24/21 0338  NA 108*   < > 123*   < > 130* 129* 131* 131*  --  134* 135  K  --    < > 4.1  --  4.3  --  4.5  --   --  4.1 4.3  CL  --    < > 90*  --  94*  --  95*  --   --  99 101  CO2  --    < > 24  --  20*  --  15*  --   --  18* 22  GLUCOSE  --    < > 89  --  77  --  140*  --   --  153* 121*  BUN  --    < > 27*  --  35*  --  38*  --   --  33* 26*  CREATININE  --    < > 1.96*  --  2.88*  --  3.17*  --   --  2.96* 2.24*  CALCIUM  --    < > 8.3*  --  8.3*  --  8.1*  --   --  8.1* 7.8*  MG 1.7  --  2.4  --   --   --  2.2  --   --   --  2.4  PHOS  --   --   --   --   --   --   --   --  4.4 2.8 2.1*   < > = values in this interval not displayed.    CBC: Recent Labs  Lab 06/05/2021 0629  06/21/21 0331 06/22/21 0408 06/22/21 1857 06/23/21 0425 06/24/21 0338  WBC 11.0* 11.1* 10.2 6.8 19.5* 17.4*  NEUTROABS 8.1*  --   --   --   --   --   HGB 15.3 13.7 14.2 14.0 13.9 13.9  HCT 41.7 38.2* 40.8 41.8 42.3 42.0  MCV 102.2* 102.4* 107.4* 111.5* 116.5* 112.6*  PLT 129* 107* 135* 132* 133* 117*     Urinalysis: No results for input(s): COLORURINE, LABSPEC, PHURINE, GLUCOSEU, HGBUR, BILIRUBINUR, KETONESUR, PROTEINUR, UROBILINOGEN, NITRITE, LEUKOCYTESUR in the last 72 hours.  Invalid input(s): APPERANCEUR    Imaging: DG Chest 1 View  Result Date: 06/23/2021 CLINICAL DATA:  51 year old male with weakness. Metabolic encephalopathy. Intubated. EXAM: CHEST  1 VIEW COMPARISON:  Portable chest 06/22/2021 and earlier. FINDINGS: Portable AP supine view at 0643 hours. Stable ET tube, left IJ central line and enteric tube which courses to the abdomen with tip not included. Left chest pacer or resuscitation pad now in place. Normal cardiac size and mediastinal contours. Left lung markings appear stable. No pneumothorax or pleural effusion on this supine view. But there is new multifocal confluent peribronchial opacity in the right lung, most pronounced at the level of the hilum. No pulmonary edema. Paucity of bowel gas. No acute osseous abnormality identified. IMPRESSION: 1. Stable lines and tubes. 2. New peribronchial opacity in the right lung is nonspecific but suspicious for Pneumonia. Electronically Signed   By: Genevie Ann M.D.   On: 06/23/2021 06:54   US Venous Img Lower Bilateral (DVT)  Result Date: 06/23/2021 CLINICAL DATA:  Swelling EXAM: BILATERAL LOWER EXTREMITY VENOUS DOPPLER ULTRASOUND TECHNIQUE: Gray-scale sonography with compression, as well as color and duplex ultrasound, were performed to evaluate the deep venous system(s) from the level of the common femoral vein through the popliteal and proximal calf veins. COMPARISON:  None Available. FINDINGS: VENOUS Normal compressibility of  the common femoral, superficial femoral, and popliteal veins, as well as the visualized calf veins. Visualized portions of profunda femoral vein and great saphenous vein unremarkable. No filling defects to suggest DVT on grayscale or color Doppler imaging. Doppler waveforms show normal direction of venous flow, normal respiratory plasticity and response to augmentation. OTHER None. Limitations: none IMPRESSION: No evidence of lower extremity DVT. Electronically Signed   By: Macy Mis M.D.   On: 06/23/2021 21:45   DG Chest Port 1 View  Result Date: 06/23/2021 CLINICAL DATA:  Central line placement. EXAM: PORTABLE CHEST 1 VIEW COMPARISON:  Same day. FINDINGS: Interval placement of right internal jugular catheter with distal tip in expected position of the SVC. No pneumothorax is noted. Otherwise stable support apparatus. IMPRESSION: Interval placement of right internal jugular catheter with distal tip in expected position of the SVC. Electronically Signed   By: Marijo Conception M.D.   On: 06/23/2021 10:00   ECHOCARDIOGRAM COMPLETE  Result Date: 06/23/2021    ECHOCARDIOGRAM REPORT   Patient Name:   Robert Coffey Date of Exam: 06/23/2021 Medical Rec #:  [id number]      Height:       75.0 in Accession #:    [id number]     Weight:       338.6 lb Date of Birth:  1970-11-06       BSA:          2.745 m Patient Age:    83 years       BP:           90/63 mmHg Patient Gender: M              HR:           111 bpm. Exam Location:  [facility] Procedure: 2D Echo, Limited Color Doppler, Cardiac Doppler and Intracardiac            Opacification Agent Indications:     Shock  History:         Patient has no prior history of Echocardiogram examinations.                  Signs/Symptoms:Shortness of Breath.  Hx of alcohol use.  Sonographer:     Charmayne Sheer Referring Phys:  [id number] Bradly Bienenstock Diagnosing Phys: Ida Rogue MD  Sonographer Comments: Patient is morbidly obese, echo performed with patient supine and on artificial  respirator and Technically challenging study due to limited acoustic windows. IMPRESSIONS  1. Left ventricular ejection fraction, by estimation, is 60 to 65%. The left ventricle has normal function. The left ventricle has no regional wall motion abnormalities. Left ventricular diastolic parameters are consistent with Grade I diastolic dysfunction (impaired relaxation).  2. Right ventricular systolic function is normal. The right ventricular size is normal.  3. The mitral valve was not well visualized. No evidence of mitral valve regurgitation. No evidence of mitral stenosis.  4. The aortic valve was not well visualized. Aortic valve regurgitation is not visualized. No aortic stenosis is present. FINDINGS  Left Ventricle: Left ventricular ejection fraction, by estimation, is 60 to 65%. The left ventricle has normal function. The left ventricle has no regional wall motion abnormalities. Definity contrast agent was given IV to delineate the left ventricular  endocardial borders. The left ventricular internal cavity size was normal in size. There is no left ventricular hypertrophy. Left ventricular diastolic parameters are consistent with Grade I diastolic dysfunction (impaired relaxation). Right Ventricle: The right ventricular size is normal. No increase in right ventricular wall thickness. Right ventricular systolic function is normal. Left Atrium: Left atrial size was normal in size. Right Atrium: Right atrial size was normal in size. Pericardium: There is no evidence of pericardial effusion. Mitral Valve: The mitral valve was not well visualized. No evidence of mitral valve regurgitation. No evidence of mitral valve stenosis. MV peak gradient, 3.8 mmHg. The mean mitral valve gradient is 2.0 mmHg. Tricuspid Valve: The tricuspid valve is not well visualized. Tricuspid valve regurgitation is not demonstrated. No evidence of tricuspid stenosis. Aortic Valve: The aortic valve was not well visualized. Aortic valve  regurgitation is not visualized. No aortic stenosis is present. Aortic valve mean gradient measures 7.0 mmHg. Aortic valve peak gradient measures 14.1 mmHg. Aortic valve area, by VTI measures 1.28 cm. Pulmonic Valve: The pulmonic valve was not well visualized. Pulmonic valve regurgitation is not visualized. No evidence of pulmonic stenosis. Aorta: The aortic root is normal in size and structure. Venous: The inferior vena cava was not well visualized. IAS/Shunts: No atrial level shunt detected by color flow Doppler.  LEFT VENTRICLE PLAX 2D LVIDd:         5.80 cm   Diastology LVIDs:         4.22 cm   LV e' medial:    6.64 cm/s LV PW:         0.97 cm   LV E/e' medial:  10.6 LV IVS:        0.82 cm   LV e' lateral:   14.60 cm/s LVOT diam:     2.30 cm   LV E/e' lateral: 4.8 LV SV:         36 LV SV Index:   13 LVOT Area:     4.15 cm  AORTIC VALVE AV Area (Vmax):    1.64 cm AV Area (Vmean):   1.72 cm AV Area (VTI):     1.28 cm AV Vmax:           188.00 cm/s AV Vmean:          123.000 cm/s AV VTI:            0.279 m AV Peak Grad:  14.1 mmHg AV Mean Grad:      7.0 mmHg LVOT Vmax:         74.40 cm/s LVOT Vmean:        51.000 cm/s LVOT VTI:          0.086 m LVOT/AV VTI ratio: 0.31  AORTA Ao Root diam: 3.20 cm MITRAL VALVE MV Area (PHT): 5.13 cm    SHUNTS MV Area VTI:   1.85 cm    Systemic VTI:  0.09 m MV Peak grad:  3.8 mmHg    Systemic Diam: 2.30 cm MV Mean grad:  2.0 mmHg MV Vmax:       0.98 m/s MV Vmean:      64.3 cm/s MV Decel Time: 148 msec MV E velocity: 70.70 cm/s MV A velocity: 83.60 cm/s MV E/A ratio:  0.85 Ida Rogue MD Electronically signed by Ida Rogue MD Signature Date/Time: 06/23/2021/12:13:04 PM    Final      Medications:     prismasol BGK 4/2.5 500 mL/hr at 06/24/21 1035    prismasol BGK 4/2.5 500 mL/hr at 06/24/21 1035   sodium chloride Stopped (06/23/21 0943)   ceFEPime (MAXIPIME) IV Stopped (06/24/21 1048)   epinephrine Stopped (06/23/21 1346)   fentaNYL infusion INTRAVENOUS 100  mcg/hr (06/24/21 1600)   heparin 1,800 Units/hr (06/24/21 1600)   metronidazole Stopped (06/24/21 0936)   midazolam 2 mg/hr (06/24/21 1600)   norepinephrine (LEVOPHED) Adult infusion 19 mcg/min (06/24/21 1600)   phenylephrine (NEO-SYNEPHRINE) Adult infusion Stopped (06/23/21 1523)   prismasol BGK 4/2.5 2,500 mL/hr at 06/24/21 1508   sodium chloride 0.9 % 250 mL with angiotensin II acetate (GIAPREZA) 2,500,000 ng 28 mL/hr at 06/24/21 0622   thiamine injection Stopped (06/24/21 1536)   Followed by   Derrill Memo ON 06/25/2021] thiamine injection     vancomycin 1,500 mg (06/23/21 2300)   vasopressin 0.03 Units/min (06/24/21 1600)    chlorhexidine gluconate (MEDLINE KIT)  15 mL Mouth Rinse BID   Chlorhexidine Gluconate Cloth  6 each Topical B9390   folic acid  1 mg Intravenous Daily   free water  30 mL Per Tube Q4H   hydrocortisone sod succinate (SOLU-CORTEF) inj  100 mg Intravenous Q8H   lactulose  20 g Per Tube TID   mouth rinse  15 mL Mouth Rinse 10 times per day   [START ON 06/25/2021] multivitamin with minerals  1 tablet Per Tube Daily   pantoprazole sodium  40 mg Per Tube Daily   potassium & sodium phosphates  2 packet Per Tube BID   senna-docusate  1 tablet Per Tube QHS   [START ON 06/27/2021] thiamine injection  100 mg Intravenous Q24H    Assessment/ Plan:     Principal Problem:   Acute metabolic encephalopathy Active Problems:   Acute kidney injury (HCC)   Hyponatremia   Alcohol withdrawal (HCC)   Transaminitis   51 y.o.  male with past medical history including alcohol abuse, chronic back pain, and peripheral neuropathy, who was admitted to [facility] on 06/15/2021 for Hyponatremia [E87.1] Elevated liver function tests [R79.89] Generalized weakness [R53.1] AKI (acute kidney injury) ([place]) [Z00.9] Acute metabolic encephalopathy [Q33.00]  #1: Acute kidney injury: Patient is anuric renal failure.  Started on CRRT and has been tolerating well.  #2: Hypotension/shock: Continue to manage  the patient with pressors and wean as tolerated to keep MAP over 90.  #3: Hyponatremia: Has improved significantly.  He is off of hypertonic saline at this time.  #4: Acute respiratory failure with possible  pneumonia: Continue antibiotics as ordered.  Maintain on vent care.  Overall prognosis is poor.  We will continue to monitor closely.   LOS: [place], MD [facility] 5/20/20234:03 PM

## 2021-06-24 NOTE — Plan of Care (Signed)

## 2021-06-24 NOTE — Consult Note (Signed)
[place] for Electrolyte Monitoring and Replacement   Recent Labs: Potassium (mmol/L)  Date Value  06/24/2021 4.3   Magnesium (mg/dL)  Date Value  06/24/2021 2.4   Calcium (mg/dL)  Date Value  06/24/2021 7.8 (L)   Albumin (g/dL)  Date Value  06/24/2021 2.4 (L)   Phosphorus (mg/dL)  Date Value  06/24/2021 2.1 (L)   Sodium (mmol/L)  Date Value  06/24/2021 135   Assessment: Patient is a 51 y/o M with medical history including obesity, chronic back pain, EtOH use disorder who presented to the ED 5/16 with weakness in setting of abdominal cramps, nausea, vomiting, and diarrhea. Patient subsequently admitted for severe symptomatic hyponatremia. Patient decompensated on 5/18 with acute onset respiratory failure requiring intubation. Patient remains admitted to the ICU where he is intubated, sedated, and on mechanical ventilation. There is further concern for septic shock and patient is requiring multiple vasopressor infusions. Admission further complicated by acute renal failure necessitating initiation of CRRT on 5/19. Pharmacy consulted to assist with electrolyte monitoring and replacement as indicated.  Nutrition: Tube feeds are on hold given instability / high dose vasopressor infusions. Free water 30  ml q4H Cathartics: Lactulose 20 g TID for hepatic encephalopathy  Goal of Therapy:  Electrolytes within normal limits  Plan:  Will Na/K phos packets x 2. --Renal function panels ordered BID while on CRRT  Oswald Hillock, PharmD, BCPS 06/24/2021 7:18 AM

## 2021-06-24 NOTE — Progress Notes (Signed)
NAME:  Robert Coffey, MRN:  [id number], DOB:  1970-06-08, LOS: 4 ADMISSION DATE:  06/28/2021, CONSULTATION DATE:  06/22/2021 REFERRING MD:  Dr. Jimmye Norman, CHIEF COMPLAINT:  Acute Respiratory Distress, AMS   Brief Pt Description / Synopsis:  51 year old male admitted with acute metabolic encephalopathy secondary to alcohol withdrawal and severe hyponatremia requiring hypertonic saline.  On 5/18 developed acute hypoxic & hypercapnic respiratory failure requiring intubation and mechanical ventilation.  History of Present Illness:  Robert Coffey is a 51 year old male with a past medical history significant for alcohol abuse, obesity, chronic back pain, peripheral neuropathy who presented to [facility] ED on 06/23/2021 due to altered mental status, poor p.o. intake, progressive weakness with difficulty getting around at home.  Patient is now intubated and unable to contribute to history, therefore history is obtained from patient's wife at bedside and chart review.  Per the patient's wife, the patient has not been feeling well with poor p.o. intake for approximately a month, however has continued to drink alcohol daily (usually 3-4 drinks of liquor) during that time.  Over the past several days he has become progressively weak with difficulty getting around at home (not even in bed able to get out of bed), intermittent confusion, and diffuse abdominal cramping/nausea/vomiting/diarrhea.  Denies any history of falls, chest pain, shortness of breath, fever, cough.  Last known alcoholic drink was the day prior to admission on Sunday.  ED Course: Initial Vital Signs: Temperature 98.1 F orally, blood pressure 136/119, respiratory rate 18, pulse 106, SPO2 95% Significant Labs: Sodium 109, chloride 69, glucose 108, BUN 8, creatinine 1.62, anion gap 17, albumin 3.1, lipase 86, AST 256, ALT 119, total bilirubin 5.0, high-sensitivity troponin 67, serum osmolality 236, urine osmolality 437, urine sodium less than 10 WBC  11.0, platelets 129, TSH 2.1 COVID-19 and influenza PCR negative Urinalysis negative for UTI Urine drug screen positive for tricyclics Ethyl alcohol less than 10 Imaging CT head without contrast>>IMPRESSION: 1. Fluid or debris in the left sinus tympani/middle ear, cannot exclude otitis media. There small bilateral mastoid effusions and chronic bilateral maxillary sinusitis. 2. Advanced for age cerebral atrophy. 3. Atherosclerosis. CT abdomen and pelvis>>IMPRESSION: Hepatic steatosis. 2.6 cm enhancing left adrenal nodule is noted. When the patient is clinically stable and able to follow directions and hold their breath (preferably as an outpatient) further evaluation with dedicated abdominal MRI should be considered. Medications Administered: 1 L normal saline bolus, hypertonic saline infusion  He was admitted by the hospitalist for further work-up and treatment of acute metabolic encephalopathy in the setting of DTs and severe hyponatremia.  Please see "significant hospital events" section below for full detailed hospital course.  Pertinent  Medical History  Alcohol abuse Obesity Chronic back  Micro Data:  5/16: SARS-CoV-2 and influenza PCR>> negative 5/16: HIV screen>> nonreactive 5/16: MRSA PCR>> negative 5/18: Tracheal aspirate>>gram + rods & cocci, gram - rods 5/19: Blood x2>>  Antimicrobials:  5/18: Unasyn>>x2 doses  5/18: Cefepime>> 5/18: Vancomycin>> 5/18: Flagyl>>  Significant Hospital Events: Including procedures, antibiotic start and stop dates in addition to other pertinent events   5/16: Admitted by hospitalist for acute metabolic encephalopathy in setting of severe hyponatremia and DTs.  Requiring hypertonic saline 5/18: Developed acute hypoxic respiratory failure, possible concern for aspiration.  Required intubation and mechanical ventilation. 5/19: Pt with worsening acute renal failure with severe metabolic and lactic acidosis requiring CRRT.  Severely  hypotensive requiring epinephrine, levophed, and vasopressin gtts to maintain map >65  5/20: Remains critically ill, on CRRT. Vent support  slightly weaned to 60% FiO2 & 10 PEEP. Some improvement in vasopressor requirements  Interim History / Subjective:  -No significant events noted overnight, remains critically ill, some slight improvements -Code status changed back to Full Code last night -Afebrile, requiring 3 vasopressors (Levo, Vasopressin, Giapreza) -Vent support decreased to 80% FiO2, 10 PEEP -Remains on CRRT and tolerating, acidosis resolved with CRRT -Preliminary Tracheal aspirate results with gram + rods & cocci, gram - rods -Blood cultures still with NGTD   Objective   Blood pressure 110/68, pulse 87, temperature 98.2 F (36.8 C), temperature source Esophageal, resp. rate (!) 22, height $RemoveBe'6\' 3"'ZOegAHzid$  (1.905 m), weight (!) 153.8 kg, SpO2 96 %.    Vent Mode: PRVC FiO2 (%):  [60 %-80 %] 60 % Set Rate:  [22 bmp] 22 bmp Vt Set:  [650 mL] 650 mL PEEP:  [10 cmH20] 10 cmH20 Plateau Pressure:  [20 cmH20-22 cmH20] 22 cmH20   Intake/Output Summary (Last 24 hours) at 06/24/2021 6759 Last data filed at 06/24/2021 0700 Gross per 24 hour  Intake 4422.62 ml  Output 4599 ml  Net -176.38 ml    Filed Weights   06/22/21 0800 06/23/21 0900 06/24/21 0321  Weight: (!) 140.2 kg (!) 153.6 kg (!) 153.8 kg    Examination: General: Critically ill-appearing obese male, laying in bed, NAD mechanically intubated, sedated HENT: Atraumatic, normocephalic, neck supple, difficult to assess JVD due to body habitus Lungs: Mechanical breath sounds bilaterally, synchronous with vent, even, nonlabored Cardiovascular: rrr, s1-s2, no murmurs, rubs, gallops, 2+ generalized edema  Abdomen: +BS x4, obese, soft, mild distension  Extremities: Normal bulk and tone Neuro: Sedated, not following commands, PERRL  GU: Indwelling foley catheter draining dark yellow urine   Resolved Hospital Problem list     Assessment  & Plan:  Acute hypoxic & hypercapnic respiratory failure in the setting of suspected aspiration in the setting of severe DTs ? Pulmonary Embolism  -Full vent support, implement lung protective strategies -Plateau pressures less than 30 cm H20 -Wean FiO2 & PEEP as tolerated to maintain O2 sats >92% -Follow intermittent Chest X-ray & ABG as needed -Spontaneous Breathing Trials when respiratory parameters met and mental status permits -Implement VAP Bundle -Prn Bronchodilators -Continue abx as outlined above  -Treating empirically with heparin gtt for possible PE given hx of poor mobility prior to admission (unable to obtain CTA Chest due to AKI and hemodynamic instability) -Venous US Bilateral LE negative for DVT  Septic shock  Elevated troponin suspect secondary to demand ischemia -Continuous cardiac monitoring -Maintain MAP >65 -Vasopressors as needed to maintain MAP goal -Stress dose steroids -Trend lactic acid until normalized -HS Troponin peaked at 67 -Echocardiogram 06/23/21: LVEF 60-65%, Grade I DD, normal RV systolic function  Severe Sepsis due to Suspected aspiration CT head 5/16 unable to exclude left otitis media and chronic bilateral maxillary sinusitis -Monitor fever curve -Trend WBC's & Procalcitonin -Follow cultures as above -Continue empiric Cefepime, Flagyl, & Vancomycin pending cultures & sensitivities  Hypotonic hyponatremia, suspect secondary to dehydration & poor p.o. intake~RESOLVED Acute kidney injury secondary to ATN with severe metabolic acidosis  Lactic acidosis  -Hypertonic saline discontinued  -Trend BMP, lactic acid, and ABG -Replace electrolytes as indicated  -Monitor UOP -Nephrology following, appreciate input: continue CRRT   Transaminitis secondary to hepatic steatosis & ETOH abuse & ? Shock liver -Trend LFTs and coags -AFP tumor marker pending   Thrombocytopenia, suspect secondary to alcohol abuse -Trend CBC -Monitor for s/sx of bleeding  and transfuse for Hgb <7 -Transfuse platelets  for platelet count less than 50 with active bleeding  Incidental finding of left adrenal nodule via CT Abd/Pelvis  -Will need abdominal MRI for further evaluation in the outpatient setting   Acute metabolic encephalopathy in the setting of severe DTs and severe hyponatremia Sedation needs in the setting of mechanical ventilation -Maintain a RASS goal of 0 to -1 -Fentanyl and versed as needed to maintain RASS goal -Avoid sedating medications as able -Daily wake up assessment -High-dose thiamine x3 days -Continue folic acid and multivitamin -Continue CIWA protocol -Continue lactulose and trend ammonia level   Best Practice (right click and "Reselect all SmartList Selections" daily)  Diet/type: NPO DVT prophylaxis: Heparin gtt  GI prophylaxis: PPI Lines: Central line, and is still needed Foley:  Yes, and it is still needed Code Status: Full Code Last date of multidisciplinary goals of care discussion [06/24/21]  Updated pts wife at bedside regarding decline in pts condition and overall prognosis.    Labs   CBC: Recent Labs  Lab 06/25/2021 0629 06/21/21 0331 06/22/21 0408 06/22/21 1857 06/23/21 0425 06/24/21 0338  WBC 11.0* 11.1* 10.2 6.8 19.5* 17.4*  NEUTROABS 8.1*  --   --   --   --   --   HGB 15.3 13.7 14.2 14.0 13.9 13.9  HCT 41.7 38.2* 40.8 41.8 42.3 42.0  MCV 102.2* 102.4* 107.4* 111.5* 116.5* 112.6*  PLT 129* 107* 135* 132* 133* 117*     Basic Metabolic Panel: Recent Labs  Lab 07/03/2021 1207 06/24/2021 1408 06/22/21 0408 06/22/21 0731 06/22/21 2220 06/22/21 2350 06/23/21 0425 06/23/21 0717 06/23/21 1140 06/23/21 1519 06/24/21 0338  NA 108*   < > 123*   < > 130* 129* 131* 131*  --  134* 135  K  --    < > 4.1  --  4.3  --  4.5  --   --  4.1 4.3  CL  --    < > 90*  --  94*  --  95*  --   --  99 101  CO2  --    < > 24  --  20*  --  15*  --   --  18* 22  GLUCOSE  --    < > 89  --  77  --  140*  --   --  153* 121*   BUN  --    < > 27*  --  35*  --  38*  --   --  33* 26*  CREATININE  --    < > 1.96*  --  2.88*  --  3.17*  --   --  2.96* 2.24*  CALCIUM  --    < > 8.3*  --  8.3*  --  8.1*  --   --  8.1* 7.8*  MG 1.7  --  2.4  --   --   --  2.2  --   --   --  2.4  PHOS  --   --   --   --   --   --   --   --  4.4 2.8 2.1*   < > = values in this interval not displayed.    GFR: Estimated Creatinine Clearance: 64 mL/min (A) (by C-G formula based on SCr of 2.24 mg/dL (H)). Recent Labs  Lab 06/22/21 0408 06/22/21 1855 06/22/21 1857 06/22/21 2220 06/23/21 0425 06/23/21 0716 06/23/21 0717 06/23/21 1140 06/23/21 1815 06/24/21 0338 06/24/21 0339  PROCALCITON  --   --   --   --   --   --  16.69  --   --  7.12  --   WBC 10.2  --  6.8  --  19.5*  --   --   --   --  17.4*  --   LATICACIDVEN  --    < >  --    < >  --  >9.0*  --  >9.0* 5.5*  --  4.1*   < > = values in this interval not displayed.     Liver Function Tests: Recent Labs  Lab 06/09/2021 0629 06/23/21 0425 06/23/21 1519 06/24/21 0338 06/24/21 0339  AST 256* 752*  --   --  1,148*  ALT 119* 175*  --   --  280*  ALKPHOS 116 107  --   --  138*  BILITOT 5.0* 6.3*  --   --  8.0*  PROT 6.8 5.9*  --   --  6.0*  ALBUMIN 3.1* 2.5* 2.3* 2.4* 2.4*    Recent Labs  Lab 06/16/2021 0629  LIPASE 86*    Recent Labs  Lab 06/22/21 1531 06/23/21 1140  AMMONIA 96* 40*     ABG    Component Value Date/Time   PHART 7.36 06/24/2021 0354   PCO2ART 39 06/24/2021 0354   PO2ART 121 (H) 06/24/2021 0354   HCO3 22.0 06/24/2021 0354   ACIDBASEDEF 3.1 (H) 06/24/2021 0354   O2SAT 100 06/24/2021 0354      Coagulation Profile: Recent Labs  Lab 06/22/21 2010 06/23/21 0425  INR 1.5* 2.0*     Cardiac Enzymes: No results for input(s): CKTOTAL, CKMB, CKMBINDEX, TROPONINI in the last 168 hours.  HbA1C: No results found for: HGBA1C  CBG: Recent Labs  Lab 06/23/21 1112 06/23/21 1613 06/23/21 2007 06/23/21 2350 06/24/21 0359  GLUCAP 163*  136* 127* 148* 114*     Review of Systems:   Unable to assess due to intubation/sedation/critical illness   Past Medical History:  He,  has no past medical history on file.   Surgical History:  History reviewed. No pertinent surgical history.   Social History:      Family History:  His family history is not on file.   Allergies Allergies  Allergen Reactions   Sulfa Antibiotics      Home Medications  Prior to Admission medications   Medication Sig Start Date End Date Taking? Authorizing Provider  atorvastatin (LIPITOR) 20 MG tablet Take 20 mg by mouth daily. 02/03/21  Yes [provider]  cyanocobalamin 1000 MCG tablet Take by mouth.   Yes [provider]  hydrochlorothiazide (HYDRODIURIL) 25 MG tablet Take 25 mg by mouth daily. 05/30/21  Yes [provider]  lisinopril (ZESTRIL) 40 MG tablet Take 40 mg by mouth daily. 05/04/21  Yes [provider]  rOPINIRole (REQUIP) 0.25 MG tablet Take 0.25 mg by mouth 3 (three) times daily. 07/28/20  Yes [provider]  vitamin C (ASCORBIC ACID) 500 MG tablet Take 500 mg by mouth daily.   Yes [provider]  zolpidem (AMBIEN) 10 MG tablet Take 10 mg by mouth at bedtime as needed. 06/07/21  Yes [provider]     Critical care time: 45 minutes    Darel Hong, AGACNP-BC [facility] epic messenger for cross cover needs If after hours, please call E-link

## 2021-06-25 ENCOUNTER — Encounter: Payer: Self-pay | Admitting: Internal Medicine

## 2021-06-25 ENCOUNTER — Inpatient Hospital Stay: Payer: BC Managed Care – PPO

## 2021-06-25 DIAGNOSIS — J690 Pneumonitis due to inhalation of food and vomit: Secondary | ICD-10-CM | POA: Diagnosis not present

## 2021-06-25 DIAGNOSIS — F10931 Alcohol use, unspecified with withdrawal delirium: Secondary | ICD-10-CM | POA: Diagnosis not present

## 2021-06-25 DIAGNOSIS — G9341 Metabolic encephalopathy: Secondary | ICD-10-CM | POA: Diagnosis not present

## 2021-06-25 DIAGNOSIS — R7401 Elevation of levels of liver transaminase levels: Secondary | ICD-10-CM

## 2021-06-25 DIAGNOSIS — N179 Acute kidney failure, unspecified: Secondary | ICD-10-CM | POA: Diagnosis not present

## 2021-06-25 LAB — CULTURE, RESPIRATORY W GRAM STAIN: Culture: NORMAL

## 2021-06-25 LAB — BLOOD GAS, ARTERIAL
Acid-base deficit: 2.6 mmol/L — ABNORMAL HIGH (ref 0.0–2.0)
Bicarbonate: 21.8 mmol/L (ref 20.0–28.0)
FIO2: 40 %
MECHVT: 650 mL
O2 Saturation: 99.5 %
Patient temperature: 37
RATE: 22 resp/min
pCO2 arterial: 36 mmHg (ref 32–48)
pH, Arterial: 7.39 (ref 7.35–7.45)
pO2, Arterial: 99 mmHg (ref 83–108)

## 2021-06-25 LAB — HEPATIC FUNCTION PANEL
ALT: 289 U/L — ABNORMAL HIGH (ref 0–44)
AST: 1000 U/L — ABNORMAL HIGH (ref 15–41)
Albumin: 2.3 g/dL — ABNORMAL LOW (ref 3.5–5.0)
Alkaline Phosphatase: 125 U/L (ref 38–126)
Bilirubin, Direct: 4.6 mg/dL — ABNORMAL HIGH (ref 0.0–0.2)
Indirect Bilirubin: 3.3 mg/dL — ABNORMAL HIGH (ref 0.3–0.9)
Total Bilirubin: 7.9 mg/dL — ABNORMAL HIGH (ref 0.3–1.2)
Total Protein: 5.8 g/dL — ABNORMAL LOW (ref 6.5–8.1)

## 2021-06-25 LAB — GLUCOSE, CAPILLARY
Glucose-Capillary: 119 mg/dL — ABNORMAL HIGH (ref 70–99)
Glucose-Capillary: 127 mg/dL — ABNORMAL HIGH (ref 70–99)
Glucose-Capillary: 121 mg/dL — ABNORMAL HIGH (ref 70–99)
Glucose-Capillary: 133 mg/dL — ABNORMAL HIGH (ref 70–99)
Glucose-Capillary: 126 mg/dL — ABNORMAL HIGH (ref 70–99)
Glucose-Capillary: 129 mg/dL — ABNORMAL HIGH (ref 70–99)

## 2021-06-25 LAB — RENAL FUNCTION PANEL
Albumin: 2.2 g/dL — ABNORMAL LOW (ref 3.5–5.0)
Albumin: 2.2 g/dL — ABNORMAL LOW (ref 3.5–5.0)
Anion gap: 10 (ref 5–15)
Anion gap: 11 (ref 5–15)
BUN: 25 mg/dL — ABNORMAL HIGH (ref 6–20)
BUN: 27 mg/dL — ABNORMAL HIGH (ref 6–20)
CO2: 21 mmol/L — ABNORMAL LOW (ref 22–32)
CO2: 22 mmol/L (ref 22–32)
Calcium: 8.2 mg/dL — ABNORMAL LOW (ref 8.9–10.3)
Calcium: 9 mg/dL (ref 8.9–10.3)
Chloride: 104 mmol/L (ref 98–111)
Chloride: 104 mmol/L (ref 98–111)
Creatinine, Ser: 1.82 mg/dL — ABNORMAL HIGH (ref 0.61–1.24)
Creatinine, Ser: 1.77 mg/dL — ABNORMAL HIGH (ref 0.61–1.24)
GFR, Estimated: 45 mL/min — ABNORMAL LOW (ref >60)
GFR, Estimated: 47 mL/min — ABNORMAL LOW (ref >60)
Glucose, Bld: 118 mg/dL — ABNORMAL HIGH (ref 70–99)
Glucose, Bld: 121 mg/dL — ABNORMAL HIGH (ref 70–99)
Phosphorus: 2.6 mg/dL (ref 2.5–4.6)
Phosphorus: 2.6 mg/dL (ref 2.5–4.6)
Potassium: 4.3 mmol/L (ref 3.5–5.1)
Potassium: 3.9 mmol/L (ref 3.5–5.1)
Sodium: 135 mmol/L (ref 135–145)
Sodium: 137 mmol/L (ref 135–145)

## 2021-06-25 LAB — MAGNESIUM
Magnesium: 2.3 mg/dL (ref 1.7–2.4)
Magnesium: 2.2 mg/dL (ref 1.7–2.4)

## 2021-06-25 LAB — CBC
HCT: 42.1 % (ref 39.0–52.0)
Hemoglobin: 14 g/dL (ref 13.0–17.0)
MCH: 37.9 pg — ABNORMAL HIGH (ref 26.0–34.0)
MCHC: 33.3 g/dL (ref 30.0–36.0)
MCV: 114.1 fL — ABNORMAL HIGH (ref 80.0–100.0)
Platelets: 107 10*3/uL — ABNORMAL LOW (ref 150–400)
RBC: 3.69 MIL/uL — ABNORMAL LOW (ref 4.22–5.81)
RDW: 18.4 % — ABNORMAL HIGH (ref 11.5–15.5)
WBC: 16.3 10*3/uL — ABNORMAL HIGH (ref 4.0–10.5)
nRBC: 4.4 % — ABNORMAL HIGH (ref 0.0–0.2)

## 2021-06-25 LAB — AMMONIA: Ammonia: 85 umol/L — ABNORMAL HIGH (ref 9–35)

## 2021-06-25 LAB — HEPARIN LEVEL (UNFRACTIONATED): Heparin Unfractionated: 0.56 IU/mL (ref 0.30–0.70)

## 2021-06-25 LAB — LACTIC ACID, PLASMA: Lactic Acid, Venous: 4.1 mmol/L (ref 0.5–1.9)

## 2021-06-25 LAB — PROCALCITONIN: Procalcitonin: 5.62 ng/mL

## 2021-06-25 IMAGING — DX DG CHEST 1V PORT
1 series · 1 of 1 positions shown · non-contrast
Comparison: [DATE].

CLINICAL DATA: Hypoxic respiratory failure.

EXAM:
PORTABLE CHEST 1 VIEW

[chest ap]
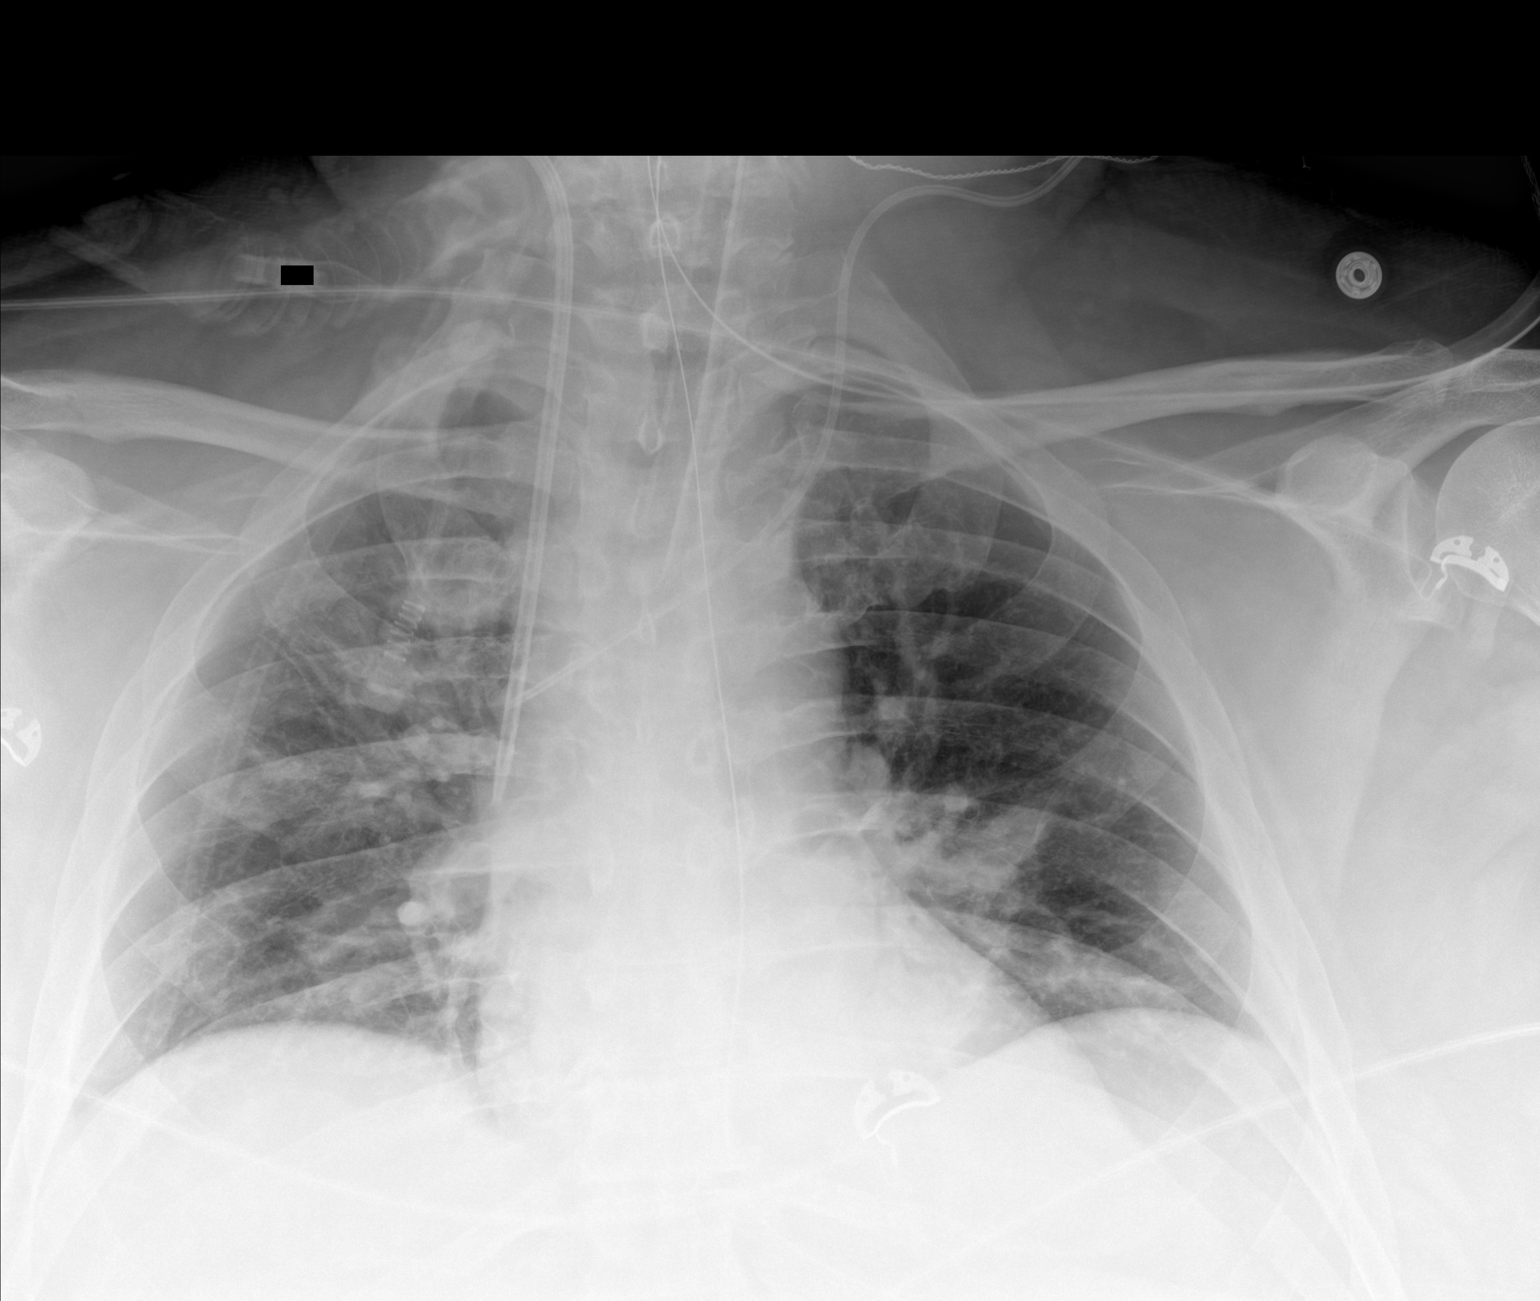

[1 of 1 positions shown; findings below may reference images not displayed]

FINDINGS: [DATE] a.m., [DATE].  ETT tip is 3.7 cm from the carina.

Enteric tube enters the stomach with intragastric course not
evaluated.

Bilateral IJ catheters are stable in positioning, the left with tip
at the azygous confluence, the right with tip in the distal SVC.

There is mild cardiomegaly, normal caliber of the central vessels
with decreased vascular prominence in the interval.

No pleural effusion is seen. There is increased opacity in the right
suprahilar and left infrahilar areas today concerning for pneumonia.

The remaining lungs clear. In all other respects no further changes.
IMPRESSION: 1. Increased opacity in the right suprahilar and left infrahilar
areas concerning for pneumonia.
2. Central vessels are normal in caliber, previously were engorged.
3. Tubes and lines as above.

## 2021-06-25 MED ORDER — SODIUM CHLORIDE 0.9 % IV SOLN: INTRAVENOUS | Status: DC | PRN

## 2021-06-25 MED ORDER — FENTANYL BOLUS VIA INFUSION
50.0000 ug | INTRAVENOUS | Status: DC | PRN
  Administered 2021-06-27: 50 ug via INTRAVENOUS

## 2021-06-25 MED ORDER — LACTULOSE 10 GM/15ML PO SOLN
30.0000 g | Freq: Three times a day (TID) | ORAL | Status: DC
  Administered 2021-06-25 – 2021-06-27 (×5): 30 g
  Filled 2021-06-25 (×5): qty 60

## 2021-06-25 MED ORDER — VANCOMYCIN HCL 1750 MG/350ML IV SOLN: 1750.0000 mg | INTRAVENOUS | Status: DC

## 2021-06-25 MED ORDER — PIPERACILLIN-TAZOBACTAM 3.375 G IVPB
3.3750 g | Freq: Four times a day (QID) | INTRAVENOUS | Status: DC
Start: 2021-06-25 — End: 2021-06-25

## 2021-06-25 MED ORDER — PIPERACILLIN-TAZOBACTAM 3.375 G IVPB 30 MIN
3.3750 g | Freq: Four times a day (QID) | INTRAVENOUS | Status: AC
  Administered 2021-06-25 – 2021-06-26 (×5): 3.375 g via INTRAVENOUS
  Filled 2021-06-25 (×7): qty 50

## 2021-06-25 NOTE — Consult Note (Signed)
Pharmacy Antibiotic Note  Robert Coffey is a 51 y.o. male admitted on 06/06/2021 with  acute metabolic encephalopathy  likely secondary to severe hyponatremia and alcohol withdrawal.  Was initially started on IV Unasyn for suspected aspiration, but patient's condition continues to deteriorate. Pharmacy has been consulted for vancomycin and cefepime dosing. Patient is also ordered metronidazole.  Renal failure necessitating initiation of CRRT 5/19  Plan:  Cefepime 2 g IV q12h  Vancomycin random 12. Goal 15-20. Will increase vancomycin dose to 1750 mg daily.  --Levels at steady state or as clinically indicated  Continue to follow RRT plan per nephrology and adjust antibiotic regimen as appropriate. Follow-up cultures and ability to narrow antibiotics  Height: 6\' 3"  (190.5 cm) Weight: (!) 153.6 kg (338 lb 10 oz) IBW/kg (Calculated) : 84.5  Temp (24hrs), Avg:98.3 F (36.8 C), Min:97 F (36.1 C), Max:99 F (37.2 C)  Recent Labs  Lab 06/22/21 0408 06/22/21 1855 06/22/21 1857 06/22/21 2220 06/23/21 0425 06/23/21 0716 06/23/21 1140 06/23/21 1519 06/23/21 1815 06/24/21 0338 06/24/21 0339 06/24/21 1406 06/24/21 2100 06/25/21 0320 06/25/21 0332  WBC 10.2  --  6.8  --  19.5*  --   --   --   --  17.4*  --   --   --  16.3*  --   CREATININE 1.96*  --   --    < > 3.17*  --   --  2.96*  --  2.24*  --  1.97*  --   --  1.82*  LATICACIDVEN  --    < >  --    < >  --  >9.0* >9.0*  --  5.5*  --  4.1*  --   --   --  4.1*  VANCORANDOM  --   --   --   --   --   --   --   --   --   --   --   --  12  --   --    < > = values in this interval not displayed.     Estimated Creatinine Clearance: 78.7 mL/min (A) (by C-G formula based on SCr of 1.82 mg/dL (H)).    Allergies  Allergen Reactions   Sulfa Antibiotics    Antimicrobials this admission: Unasyn 5/18 >> 5/18 Vancomycin 5/18 >> Cefepime 5/18 >> Metronidazole 5/18 >>  Dose adjustments this admission: N/A  Microbiology results: 5/16  MRSA PCR: (-) 5/18 Tracheal aspirate: pending 5/19 Bcx: pending  Thank you for allowing pharmacy to be a part of this patient's care.  Oswald Hillock 06/25/2021 8:05 AM

## 2021-06-25 NOTE — Progress Notes (Signed)
NAME:  Robert Coffey, MRN:  [id number], DOB:  [birth year], LOS: 5 ADMISSION DATE:  06/15/2021, CONSULTATION DATE:  06/22/2021 REFERRING MD:  Dr. Jimmye Norman, CHIEF COMPLAINT:  Acute Respiratory Distress, AMS   Brief Pt Description / Synopsis:  50 year old male admitted with acute metabolic encephalopathy secondary to alcohol withdrawal and severe hyponatremia requiring hypertonic saline.  On 5/18 developed acute hypoxic & hypercapnic respiratory failure requiring intubation and mechanical ventilation.  History of Present Illness:  Robert Coffey is a 51 year old male with a past medical history significant for alcohol abuse, obesity, chronic back pain, peripheral neuropathy who presented to [facility] ED on 06/06/2021 due to altered mental status, poor p.o. intake, progressive weakness with difficulty getting around at home.  Patient is now intubated and unable to contribute to history, therefore history is obtained from patient's wife at bedside and chart review.  Per the patient's wife, the patient has not been feeling well with poor p.o. intake for approximately a month, however has continued to drink alcohol daily (usually 3-4 drinks of liquor) during that time.  Over the past several days he has become progressively weak with difficulty getting around at home (not even in bed able to get out of bed), intermittent confusion, and diffuse abdominal cramping/nausea/vomiting/diarrhea.  Denies any history of falls, chest pain, shortness of breath, fever, cough.  Last known alcoholic drink was the day prior to admission on Sunday.  ED Course: Initial Vital Signs: Temperature 98.1 F orally, blood pressure 136/119, respiratory rate 18, pulse 106, SPO2 95% Significant Labs: Sodium 109, chloride 69, glucose 108, BUN 8, creatinine 1.62, anion gap 17, albumin 3.1, lipase 86, AST 256, ALT 119, total bilirubin 5.0, high-sensitivity troponin 67, serum osmolality 236, urine osmolality 437, urine sodium less than 10 WBC  11.0, platelets 129, TSH 2.1 COVID-19 and influenza PCR negative Urinalysis negative for UTI Urine drug screen positive for tricyclics Ethyl alcohol less than 10 Imaging CT head without contrast>>IMPRESSION: 1. Fluid or debris in the left sinus tympani/middle ear, cannot exclude otitis media. There small bilateral mastoid effusions and chronic bilateral maxillary sinusitis. 2. Advanced for age cerebral atrophy. 3. Atherosclerosis. CT abdomen and pelvis>>IMPRESSION: Hepatic steatosis. 2.6 cm enhancing left adrenal nodule is noted. When the patient is clinically stable and able to follow directions and hold their breath (preferably as an outpatient) further evaluation with dedicated abdominal MRI should be considered. Medications Administered: 1 L normal saline bolus, hypertonic saline infusion  He was admitted by the hospitalist for further work-up and treatment of acute metabolic encephalopathy in the setting of DTs and severe hyponatremia.  Please see "significant hospital events" section below for full detailed hospital course.  Pertinent  Medical History  Alcohol abuse Obesity Chronic back  Micro Data:  5/16: SARS-CoV-2 and influenza PCR>> negative 5/16: HIV screen>> nonreactive 5/16: MRSA PCR>> negative 5/18: Tracheal aspirate>>gram + rods & cocci, gram - rods 5/19: Blood x2>>  Antimicrobials:  5/18: Unasyn>>x2 doses  5/18: Cefepime>> 5/18: Vancomycin>> 5/18: Flagyl>>  Significant Hospital Events: Including procedures, antibiotic start and stop dates in addition to other pertinent events   5/16: Admitted by hospitalist for acute metabolic encephalopathy in setting of severe hyponatremia and DTs.  Requiring hypertonic saline 5/18: Developed acute hypoxic respiratory failure, possible concern for aspiration.  Required intubation and mechanical ventilation. 5/19: Pt with worsening acute renal failure with severe metabolic and lactic acidosis requiring CRRT.  Severely  hypotensive requiring epinephrine, levophed, and vasopressin gtts to maintain map >65  5/20: Remains critically ill, on CRRT. Vent support  slightly weaned to 60% FiO2 & 10 PEEP. Some improvement in vasopressor requirements (off of epinephrine and phenylephrine) 5/21: Overnight weaned off of Giapreza, currently only on norepinephrine and vasopressin  Interim History / Subjective:  -No significant events noted overnight, remains critically ill, some slight improvements -Code status changed back to Full Code last night -Afebrile, requiring 3 vasopressors (Levo, Vasopressin, Giapreza) -Vent support decreased to 80% FiO2, 10 PEEP -Remains on CRRT and tolerating, acidosis resolved with CRRT -Preliminary Tracheal aspirate results with gram + rods & cocci, gram - rods -Blood cultures still with NGTD   Objective   Blood pressure 112/72, pulse 77, temperature 98.1 F (36.7 C), temperature source Esophageal, resp. rate (!) 22, height _0  (1.905 m), weight (!) 153.6 kg, SpO2 97 %.    Vent Mode: PRVC FiO2 (%):  [22 %-40 %] 40 % Set Rate:  [22 bmp] 22 bmp Vt Set:  [650 mL] 650 mL PEEP:  [8 cmH20] 8 cmH20 Plateau Pressure:  [21 cmH20] 21 cmH20   Intake/Output Summary (Last 24 hours) at 06/25/2021 0730 Last data filed at 06/25/2021 0700 Gross per 24 hour  Intake 2675.48 ml  Output 3979 ml  Net -1303.52 ml    Filed Weights   06/23/21 0900 06/24/21 0321 06/25/21 0325  Weight: (!) 153.6 kg (!) 153.8 kg (!) 153.6 kg   Examination: General: Critically ill-appearing obese male, laying in bed, NAD mechanically intubated, sedated HENT: Atraumatic, normocephalic, neck supple, difficult to assess JVD due to body habitus Lungs: Mechanical breath sounds bilaterally, synchronous with vent, even, nonlabored Cardiovascular: rrr, s1-s2, no murmurs, rubs, gallops, 2+ generalized edema  Abdomen: +BS x4, obese, soft, mild distension  Extremities: Significant sarcopenia, anasarca 2+ Neuro: Sedated, not  following commands, PERRL  GU: Indwelling foley catheter draining dark yellow urine   Imaging     Assessment & Plan:  Acute hypoxic & hypercapnic respiratory failure in the setting of suspected aspiration in the setting of severe DTs Pulmonary Embolism - cannot r/o -Full vent support, on lung protective strategies -Plateau pressures less than 30 cm H20 -Wean FiO2 & PEEP as tolerated to maintain O2 sats >92% -Follow intermittent Chest X-ray & ABG as needed -Spontaneous Breathing Trials when respiratory parameters met and mental status permits -Continue VAP Bundle -PRN Bronchodilators -Continue abx as outlined above  -Treating empirically with heparin gtt for possible PE given hx of poor mobility prior to admission (unable to obtain CTA Chest due to AKI and hemodynamic instability) -Venous US Bilateral LE negative for DVT  Septic shock  Elevated troponin suspect secondary to demand ischemia -Continuous cardiac monitoring -Maintain MAP >65 -Vasopressors as needed to maintain MAP goal -Stress dose steroids -Trend lactic acid until normalized -HS Troponin peaked at 67 -Echocardiogram 06/23/21: LVEF 60-65%, Grade I DD, normal RV systolic function  Severe Sepsis due to Suspected aspiration CT head 5/16 unable to exclude left otitis media and chronic bilateral maxillary sinusitis -Monitor fever curve -Trend WBC's & Procalcitonin -Follow cultures as above -Continue empiric Cefepime, Flagyl, & Vancomycin pending cultures & sensitivities  Hypotonic hyponatremia, suspect secondary to dehydration & poor p.o. intake~RESOLVED Acute kidney injury secondary to ATN with severe metabolic acidosis  Lactic acidosis persistent likely due to hepatic impairment -Hypertonic saline discontinued  -Trend BMP, lactic acid, and ABG -Persistent lactic acid elevation likely due to hepatic dysfunction -Replace electrolytes as indicated  -Monitor UOP -Nephrology following, appreciate input: continue CRRT    Transaminitis secondary to hepatic steatosis & ETOH abuse &  Shock liver -Trend LFTs and  coags -AFP tumor marker normal  Thrombocytopenia, suspect secondary to alcohol abuse -Trend CBC -Monitor for s/sx of bleeding and transfuse for Hgb <7 -Transfuse platelets for platelet count less than 50 with active bleeding  Incidental finding of left adrenal nodule via CT Abd/Pelvis  -Will need abdominal MRI for further evaluation in the outpatient setting   Acute metabolic encephalopathy in the setting of severe DTs and severe hyponatremia Sedation needs in the setting of mechanical ventilation -Maintain a RASS goal of 0 to -1 -Fentanyl and versed as needed to maintain RASS goal -Avoid sedating medications as able -Daily wake up assessment -High-dose thiamine x3 days -Continue folic acid and multivitamin -Continue CIWA protocol -Continue lactulose and trend ammonia level   Morbid obesity with sarcopenia Protein malnutrition -Calorie excess, protein deficiency in the setting of alcoholism -Assess for initiation of tube feeds in the morning now that pressor requirements down    Best Practice (right click and "Reselect all SmartList Selections" daily)  Diet/type: NPO DVT prophylaxis: Heparin gtt  GI prophylaxis: PPI Lines: Central line, and is still needed Foley:  Yes, and it is still needed Code Status: Full Code Last date of multidisciplinary goals of care discussion [06/25/21]  Updated pts wife at bedside regarding pts condition and guarded overall prognosis.  Daily updates will be continued.  Palliative Care consult in the morning.  Labs   CBC: Recent Labs  Lab 06/05/2021 0629 06/21/21 0331 06/22/21 0408 06/22/21 1857 06/23/21 0425 06/24/21 0338 06/25/21 0320  WBC 11.0*   < > 10.2 6.8 19.5* 17.4* 16.3*  NEUTROABS 8.1*  --   --   --   --   --   --   HGB 15.3   < > 14.2 14.0 13.9 13.9 14.0  HCT 41.7   < > 40.8 41.8 42.3 42.0 42.1  MCV 102.2*   < > 107.4* 111.5* 116.5*  112.6* 114.1*  PLT 129*   < > 135* 132* 133* 117* 107*   < > = values in this interval not displayed.     Basic Metabolic Panel: Recent Labs  Lab 06/24/2021 1207 06/30/2021 1408 06/22/21 0408 06/22/21 0731 06/23/21 0425 06/23/21 0717 06/23/21 1140 06/23/21 1519 06/24/21 0338 06/24/21 1406 06/25/21 0320 06/25/21 0332  NA 108*   < > 123*   < > 131* 131*  --  134* 135 135  --  135  K  --    < > 4.1   < > 4.5  --   --  4.1 4.3 4.7  --  4.3  CL  --    < > 90*   < > 95*  --   --  99 101 102  --  104  CO2  --    < > 24   < > 15*  --   --  18* 22 22  --  21*  GLUCOSE  --    < > 89   < > 140*  --   --  153* 121* 117*  --  118*  BUN  --    < > 27*   < > 38*  --   --  33* 26* 23*  --  25*  CREATININE  --    < > 1.96*   < > 3.17*  --   --  2.96* 2.24* 1.97*  --  1.82*  CALCIUM  --    < > 8.3*   < > 8.1*  --   --  8.1* 7.8* 7.6*  --  8.2*  MG 1.7  --  2.4  --  2.2  --   --   --  2.4  --  2.3  --   PHOS  --   --   --   --   --   --  4.4 2.8 2.1* 2.1*  --  2.6   < > = values in this interval not displayed.    GFR: Estimated Creatinine Clearance: 78.7 mL/min (A) (by C-G formula based on SCr of 1.82 mg/dL (H)). Recent Labs  Lab 06/22/21 1857 06/22/21 2220 06/23/21 0425 06/23/21 0716 06/23/21 0717 06/23/21 1140 06/23/21 1815 06/24/21 0338 06/24/21 0339 06/25/21 0320 06/25/21 0332  PROCALCITON  --   --   --   --  16.69  --   --  7.12  --  5.62  --   WBC 6.8  --  19.5*  --   --   --   --  17.4*  --  16.3*  --   LATICACIDVEN  --    < >  --    < >  --  >9.0* 5.5*  --  4.1*  --  4.1*   < > = values in this interval not displayed.     Liver Function Tests: Recent Labs  Lab 07/05/2021 0629 06/23/21 0425 06/23/21 1519 06/24/21 0338 06/24/21 0339 06/24/21 1406 06/25/21 0320 06/25/21 0332  AST 256* 752*  --   --  1,148*  --  1,000*  --   ALT 119* 175*  --   --  280*  --  289*  --   ALKPHOS 116 107  --   --  138*  --  125  --   BILITOT 5.0* 6.3*  --   --  8.0*  --  7.9*  --   PROT  6.8 5.9*  --   --  6.0*  --  5.8*  --   ALBUMIN 3.1* 2.5*   < > 2.4* 2.4* 2.2* 2.3* 2.2*   < > = values in this interval not displayed.    Recent Labs  Lab 06/19/2021 0629  LIPASE 86*    Recent Labs  Lab 06/22/21 1531 06/23/21 1140 06/25/21 0332  AMMONIA 96* 40* 85*     ABG    Component Value Date/Time   PHART 7.39 06/25/2021 0508   PCO2ART 36 06/25/2021 0508   PO2ART 99 06/25/2021 0508   HCO3 21.8 06/25/2021 0508   ACIDBASEDEF 2.6 (H) 06/25/2021 0508   O2SAT 99.5 06/25/2021 0508      Coagulation Profile: Recent Labs  Lab 06/22/21 2010 06/23/21 0425  INR 1.5* 2.0*     Cardiac Enzymes: No results for input(s): CKTOTAL, CKMB, CKMBINDEX, TROPONINI in the last 168 hours.  HbA1C: No results found for: HGBA1C  CBG: Recent Labs  Lab 06/24/21 1132 06/24/21 1609 06/24/21 1952 06/24/21 2311 06/25/21 0322  GLUCAP 131* 123* 112* 114* 119*     Review of Systems:   Unable to assess due to intubation/sedation/critical illness  Allergies Allergies  Allergen Reactions   Sulfa Antibiotics      Home Medications  Prior to Admission medications   Medication Sig Start Date End Date Taking? Authorizing Provider  atorvastatin (LIPITOR) 20 MG tablet Take 20 mg by mouth daily. 02/03/21  Yes [provider]  cyanocobalamin 1000 MCG tablet Take by mouth.   Yes [provider]  hydrochlorothiazide (HYDRODIURIL) 25 MG tablet Take 25 mg by mouth daily. 05/30/21  Yes [provider]  lisinopril (ZESTRIL)  40 MG tablet Take 40 mg by mouth daily. 05/04/21  Yes [provider]  rOPINIRole (REQUIP) 0.25 MG tablet Take 0.25 mg by mouth 3 (three) times daily. 07/28/20  Yes [provider]  vitamin C (ASCORBIC ACID) 500 MG tablet Take 500 mg by mouth daily.   Yes [provider]  zolpidem (AMBIEN) 10 MG tablet Take 10 mg by mouth at bedtime as needed. 06/07/21  Yes [provider]     Scheduled Meds:  chlorhexidine  gluconate (MEDLINE KIT)  15 mL Mouth Rinse BID   Chlorhexidine Gluconate Cloth  6 each Topical D0301   folic acid  1 mg Intravenous Daily   free water  30 mL Per Tube Q4H   hydrocortisone sod succinate (SOLU-CORTEF) inj  100 mg Intravenous Q8H   lactulose  20 g Per Tube TID   mouth rinse  15 mL Mouth Rinse 10 times per day   multivitamin with minerals  1 tablet Per Tube Daily   pantoprazole sodium  40 mg Per Tube Daily   senna-docusate  1 tablet Per Tube QHS   [START ON 06/27/2021] thiamine injection  100 mg Intravenous Q24H   Continuous Infusions:  sodium chloride Stopped (06/23/21 0943)   ceFEPime (MAXIPIME) IV Stopped (06/24/21 2304)   epinephrine Stopped (06/23/21 1346)   fentaNYL infusion INTRAVENOUS 100 mcg/hr (06/25/21 0700)   heparin 1,750 Units/hr (06/25/21 0700)   metronidazole Stopped (06/24/21 2044)   midazolam 4 mg/hr (06/25/21 0700)   norepinephrine (LEVOPHED) Adult infusion 13 mcg/min (06/25/21 0700)   phenylephrine (NEO-SYNEPHRINE) Adult infusion Stopped (06/23/21 1523)   prismasol BGK 2/2.5 dialysis solution 500 mL/hr at 06/24/21 2143   prismasol BGK 2/2.5 dialysis solution 500 mL/hr at 06/24/21 2143   prismasol BGK 2/2.5 dialysis solution 2,500 mL/hr at 06/25/21 0509   sodium chloride 0.9 % 250 mL with angiotensin II acetate (GIAPREZA) 2,500,000 ng Stopped (06/25/21 0209)   thiamine injection Stopped (06/25/21 0641)   vancomycin Stopped (06/25/21 0108)   vasopressin 0.03 Units/min (06/25/21 0700)   PRN Meds:.acetaminophen **OR** acetaminophen, fentaNYL, heparin, midazolam, ondansetron **OR** ondansetron (ZOFRAN) IV   Critical care time: 45 minutes    The patient is critically ill with multiple organ systems failure and requires high complexity decision making for assessment and support, frequent evaluation and titration of therapies, application of advanced monitoring technologies and extensive interpretation of multiple databases.    Renold Don,  MD Advanced Bronchoscopy P[facility]    *This note was dictated using voice recognition software/Dragon.  Despite best efforts to proofread, errors can occur which can change the meaning. Any transcriptional errors that result from this process are unintentional and may not be fully corrected at the time of dictation.

## 2021-06-25 NOTE — Progress Notes (Signed)
[facility]  PROGRESS NOTE   Subjective:   Patient seen in the ICU.  On the ventilator.  CRRT in progress and has been tolerating well. Slowly being weaned off pressors.  Patient's wife at bedside.  Objective:  Vital signs: Blood pressure 128/83, pulse 75, temperature 98.1 F (36.7 C), resp. rate (!) 22, height $RemoveBe'6\' 3"'[id number]$  (1.905 m), weight (!) 153.6 kg, SpO2 95 %.  Intake/Output Summary (Last 24 hours) at 06/25/2021 1403 Last data filed at 06/25/2021 1400 Gross per 24 hour  Intake 2788.84 ml  Output 2980 ml  Net -191.16 ml   Filed Weights   06/23/21 0900 06/24/21 0321 06/25/21 0325  Weight: (!) 153.6 kg (!) 153.8 kg (!) 153.6 kg     Physical Exam: General:  No acute distress  Head:  Normocephalic, atraumatic. Moist oral mucosal membranes  Eyes:  Anicteric  Neck:  Supple  Lungs:   Clear to auscultation, normal effort  Heart:  S1S2 no rubs  Abdomen:   Soft, nontender, bowel sounds present  Extremities:  peripheral edema.  Neurologic: Unresponsive on the vent.  Skin:  No lesions  Access:     Basic Metabolic Panel: Recent Labs  Lab 06/08/2021 1207 06/19/2021 1408 06/22/21 0408 06/22/21 0731 06/23/21 0425 06/23/21 0717 06/23/21 1140 06/23/21 1519 06/24/21 0338 06/24/21 1406 06/25/21 0320 06/25/21 0332  NA 108*   < > 123*   < > 131* 131*  --  134* 135 135  --  135  K  --    < > 4.1   < > 4.5  --   --  4.1 4.3 4.7  --  4.3  CL  --    < > 90*   < > 95*  --   --  99 101 102  --  104  CO2  --    < > 24   < > 15*  --   --  18* 22 22  --  21*  GLUCOSE  --    < > 89   < > 140*  --   --  153* 121* 117*  --  118*  BUN  --    < > 27*   < > 38*  --   --  33* 26* 23*  --  25*  CREATININE  --    < > 1.96*   < > 3.17*  --   --  2.96* 2.24* 1.97*  --  1.82*  CALCIUM  --    < > 8.3*   < > 8.1*  --   --  8.1* 7.8* 7.6*  --  8.2*  MG 1.7  --  2.4  --  2.2  --   --   --  2.4  --  2.3  --   PHOS  --   --   --   --   --   --  4.4 2.8 2.1* 2.1*  --  2.6   < > = values in this  interval not displayed.    CBC: Recent Labs  Lab 06/21/2021 0629 06/21/21 0331 06/22/21 0408 06/22/21 1857 06/23/21 0425 06/24/21 0338 06/25/21 0320  WBC 11.0*   < > 10.2 6.8 19.5* 17.4* 16.3*  NEUTROABS 8.1*  --   --   --   --   --   --   HGB 15.3   < > 14.2 14.0 13.9 13.9 14.0  HCT 41.7   < > 40.8 41.8 42.3 42.0 42.1  MCV 102.2*   < >  107.4* 111.5* 116.5* 112.6* 114.1*  PLT 129*   < > 135* 132* 133* 117* 107*   < > = values in this interval not displayed.     Urinalysis: No results for input(s): COLORURINE, LABSPEC, PHURINE, GLUCOSEU, HGBUR, BILIRUBINUR, KETONESUR, PROTEINUR, UROBILINOGEN, NITRITE, LEUKOCYTESUR in the last 72 hours.  Invalid input(s): APPERANCEUR    Imaging: US Venous Img Lower Bilateral (DVT)  Result Date: 06/23/2021 CLINICAL DATA:  Swelling EXAM: BILATERAL LOWER EXTREMITY VENOUS DOPPLER ULTRASOUND TECHNIQUE: Gray-scale sonography with compression, as well as color and duplex ultrasound, were performed to evaluate the deep venous system(s) from the level of the common femoral vein through the popliteal and proximal calf veins. COMPARISON:  None Available. FINDINGS: VENOUS Normal compressibility of the common femoral, superficial femoral, and popliteal veins, as well as the visualized calf veins. Visualized portions of profunda femoral vein and great saphenous vein unremarkable. No filling defects to suggest DVT on grayscale or color Doppler imaging. Doppler waveforms show normal direction of venous flow, normal respiratory plasticity and response to augmentation. OTHER None. Limitations: none IMPRESSION: No evidence of lower extremity DVT. Electronically Signed   By: Guadlupe Spanish M.D.   On: 06/23/2021 21:45   DG Chest Port 1 View  Result Date: 06/25/2021 CLINICAL DATA:  Hypoxic respiratory failure. EXAM: PORTABLE CHEST 1 VIEW COMPARISON:  06/23/2021. FINDINGS: 4:56 a.m., 06/25/2021.  ETT tip is 3.7 cm from the carina. Enteric tube enters the stomach with  intragastric course not evaluated. Bilateral IJ catheters are stable in positioning, the left with tip at the azygous confluence, the right with tip in the distal SVC. There is mild cardiomegaly, normal caliber of the central vessels with decreased vascular prominence in the interval. No pleural effusion is seen. There is increased opacity in the right suprahilar and left infrahilar areas today concerning for pneumonia. The remaining lungs clear. In all other respects no further changes. IMPRESSION: 1. Increased opacity in the right suprahilar and left infrahilar areas concerning for pneumonia. 2. Central vessels are normal in caliber, previously were engorged. 3. Tubes and lines as above. Electronically Signed   By: Almira Bar M.D.   On: 06/25/2021 07:04     Medications:    sodium chloride 10 mL/hr at 06/25/21 1400   sodium chloride 10 mL/hr at 06/25/21 1126   ceFEPime (MAXIPIME) IV Stopped (06/25/21 1047)   epinephrine Stopped (06/23/21 1346)   fentaNYL infusion INTRAVENOUS 90 mcg/hr (06/25/21 1400)   heparin 1,750 Units/hr (06/25/21 1400)   metronidazole Stopped (06/25/21 0914)   midazolam 3 mg/hr (06/25/21 1400)   norepinephrine (LEVOPHED) Adult infusion 14 mcg/min (06/25/21 1400)   phenylephrine (NEO-SYNEPHRINE) Adult infusion Stopped (06/23/21 1523)   prismasol BGK 2/2.5 dialysis solution 500 mL/hr at 06/25/21 0801   prismasol BGK 2/2.5 dialysis solution 500 mL/hr at 06/25/21 0804   prismasol BGK 2/2.5 dialysis solution 2,500 mL/hr at 06/25/21 1309   thiamine injection Stopped (06/25/21 0641)   vancomycin     vasopressin 0.03 Units/min (06/25/21 1400)    chlorhexidine gluconate (MEDLINE KIT)  15 mL Mouth Rinse BID   Chlorhexidine Gluconate Cloth  6 each Topical Q0600   folic acid  1 mg Intravenous Daily   free water  30 mL Per Tube Q4H   hydrocortisone sod succinate (SOLU-CORTEF) inj  100 mg Intravenous Q8H   lactulose  20 g Per Tube TID   mouth rinse  15 mL Mouth Rinse 10 times  per day   multivitamin with minerals  1 tablet Per Tube Daily  pantoprazole sodium  40 mg Per Tube Daily   senna-docusate  1 tablet Per Tube QHS   [START ON 06/27/2021] thiamine injection  100 mg Intravenous Q24H    Assessment/ Plan:     Principal Problem:   Acute metabolic encephalopathy Active Problems:   Acute kidney injury ([place])   Hyponatremia   Alcohol withdrawal ([facility])   Transaminitis   51 y.o.  male with past medical history including alcohol abuse, chronic back pain, and peripheral neuropathy, who was admitted to [facility] on 06/26/2021 for Hyponatremia, Elevated liver function tests. Generalized weakness, AKI (acute kidney injury) and Acute metabolic encephalopathy.    #1: Acute kidney injury: Patient has anuric renal failure.  Most likely acute tubular necrosis secondary to hypotension and shock.  Started on CRRT and has been tolerating well.   #2: Hypotension/shock: Continue to manage the patient with pressors and wean as tolerated to keep MAP over 90.   #3: Hyponatremia: Has improved significantly.  He is off of hypertonic saline at this time.   #4: Acute respiratory failure with possible pneumonia: Continue antibiotics as ordered.  Maintain on vent care.  #5: Transaminitis secondary to liver failure/shock liver: Most likely secondary to EtOH abuse.   Overall prognosis is poor.  We will continue to monitor closely.   LOS: [place], [facility] 5/21/20232:03 PM

## 2021-06-25 NOTE — Progress Notes (Signed)
[place] for IV Heparin Indication: pulmonary embolus (suspicion for, not confirmed)  Patient Measurements: Height: 6\' 3"  (190.5 cm) Weight: (!) 153.6 kg (338 lb 10 oz) IBW/kg (Calculated) : 84.5 Heparin Dosing Weight: 117.5 kg  Labs: Recent Labs    06/22/21 1857 06/22/21 2010 06/22/21 2220 06/22/21 2350 06/23/21 0425 06/23/21 1140 06/24/21 0338 06/24/21 1406 06/24/21 2130 06/25/21 0320 06/25/21 0332  HGB  --   --   --   --  13.9  --  13.9  --   --  14.0  --   HCT  --   --   --   --  42.3  --  42.0  --   --  42.1  --   PLT  --   --   --   --  133*  --  117*  --   --  107*  --   APTT  --  29  --   --   --   --   --   --   --   --   --   LABPROT  --  18.0*  --   --  22.7*  --   --   --   --   --   --   INR  --  1.5*  --   --  2.0*  --   --   --   --   --   --   HEPARINUNFRC  --   --   --   --  0.56   < > 0.75* 0.70 0.58 0.56  --   CREATININE   < >  --  2.88*  --  3.17*   < > 2.24* 1.97*  --   --  1.82*  TROPONINIHS  --   --  67* 65*  --   --   --   --   --   --   --    < > = values in this interval not displayed.   Heparin Dosing Weight: 117.5 kg  Estimated Creatinine Clearance: 78.7 mL/min (A) (by C-G formula based on SCr of 1.82 mg/dL (H)).  Assessment: Patient is a 51 y/o M with medical history including obesity, chronic back pain who presented to the ED 5/16 with weakness in setting of abdominal cramps, nausea, vomiting, and diarrhea. Intubated with hypercapnic respiratory failure. Intubated with hypercapnic respiratory failure. New concern for obstructive shock from potential PE. Decline in clinical status with tachycardia, hypotension, and FiO2 requirements of 80-90%.   Date Time  HL Rate/Comment  5/20 0338 0.75  Supratherapeutic; 2000 > 1800 un/hr 5/20 1406 0.70 Therapeutic at ULN; 1800 > 1750 un/hr 5/20 2130 0.58 Therapeutic x 2  5/21 0332 0.56 Therapeutic x 3    Goal of Therapy:  Heparin level 0.3-0.7 units/ml Monitor  platelets by anticoagulation protocol: Yes   Plan:  Heparin level is therapeutic x 3 Continue heparin infusion at 1750 units/hr.  Recheck heparin daily with AM labs CBC daily while on heparin.   Renda Rolls, PharmD, [facility] 06/25/2021 5:48 AM

## 2021-06-25 NOTE — Consult Note (Signed)
[facility] for Electrolyte Monitoring and Replacement   Recent Labs: Potassium (mmol/L)  Date Value  06/25/2021 4.3   Magnesium (mg/dL)  Date Value  06/25/2021 2.3   Calcium (mg/dL)  Date Value  06/25/2021 8.2 (L)   Albumin (g/dL)  Date Value  06/25/2021 2.2 (L)   Phosphorus (mg/dL)  Date Value  06/25/2021 2.6   Sodium (mmol/L)  Date Value  06/25/2021 135   Assessment: Patient is a 51 y/o M with medical history including obesity, chronic back pain, EtOH use disorder who presented to the ED 5/16 with weakness in setting of abdominal cramps, nausea, vomiting, and diarrhea. Patient subsequently admitted for severe symptomatic hyponatremia. Patient decompensated on 5/18 with acute onset respiratory failure requiring intubation. Patient remains admitted to the ICU where he is intubated, sedated, and on mechanical ventilation. There is further concern for septic shock and patient is requiring multiple vasopressor infusions. Admission further complicated by acute renal failure necessitating initiation of CRRT on 5/19. Pharmacy consulted to assist with electrolyte monitoring and replacement as indicated.  Nutrition: Tube feeds are on hold given instability / high dose vasopressor infusions. Free water 30  ml q4H Cathartics: Lactulose 20 g TID for hepatic encephalopathy  Goal of Therapy:  Electrolytes within normal limits  Plan:  No replacement needed at this time.  --Renal function panels ordered BID while on CRRT  Oswald Hillock, PharmD, BCPS 06/25/2021 8:03 AM

## 2021-06-25 NOTE — Plan of Care (Addendum)
Neuro: remains on fentanyl and versed-slight weans tolerated, only reflex responses observed  Resp: Tolerating ventilator well CV: afebrile, +2 edema throughout, weaning pressors slowly, vital signs remain fairly stable GIGU: foley in place UOP 72ml this shift, CRRT running and being tolerated well-transitioned to 2K bath early this morning, no BM, bowel sounds active and faint, OG in place, tolerating free water flushes and medications well Skin: intact, color improving Social: Family visiting throughout the day, all questions and concerns addressed.  This RN spoke with wife regarding patient's status. She recognizes the patient is critically ill and likely has a very poor prognosis. She has requested physicians to be very realistic with the possible and likely outcomes in order to assist her in making decisions regarding the patient's goals of care. She has expressed multiple times the patient would not want to be on life support and would not like to be limited in life with ongoing illness and/or treatment demands yet she would like to give him a chance to improve.  The patient has 3 children; 49, 49, and 79 years old.  This RN encouraged the patient's wife to think about including the children in any decisions being made. They were able to visit as a family this evening. The patient has a strong and loving support system.  Problem: Education: Goal: Knowledge of General Education information will improve Description: Including pain rating scale, medication(s)/side effects and non-pharmacologic comfort measures Outcome: Not Progressing   Problem: Health Behavior/Discharge Planning: Goal: Ability to manage health-related needs will improve Outcome: Not Progressing   Problem: Clinical Measurements: Goal: Ability to maintain clinical measurements within normal limits will improve Outcome: Not Progressing Goal: Will remain free from infection Outcome: Not Progressing Goal: Diagnostic test results  will improve Outcome: Not Progressing Goal: Respiratory complications will improve Outcome: Not Progressing Goal: Cardiovascular complication will be avoided Outcome: Not Progressing   Problem: Activity: Goal: Risk for activity intolerance will decrease Outcome: Not Progressing   Problem: Nutrition: Goal: Adequate nutrition will be maintained Outcome: Not Progressing   Problem: Coping: Goal: Level of anxiety will decrease Outcome: Not Progressing   Problem: Elimination: Goal: Will not experience complications related to bowel motility Outcome: Not Progressing Goal: Will not experience complications related to urinary retention Outcome: Not Progressing   Problem: Pain Managment: Goal: General experience of comfort will improve Outcome: Not Progressing   Problem: Safety: Goal: Ability to remain free from injury will improve Outcome: Not Progressing   Problem: Skin Integrity: Goal: Risk for impaired skin integrity will decrease Outcome: Not Progressing

## 2021-06-25 NOTE — Consult Note (Signed)
Pharmacy Antibiotic Note  Robert Coffey is a 51 y.o. male admitted on 06/06/2021 with  acute metabolic encephalopathy  likely secondary to severe hyponatremia and alcohol withdrawal.  Was initially started on IV Unasyn for suspected aspiration, but patient's condition continues to deteriorate. Pharmacy has been consulted for vancomycin and cefepime dosing now consolidating therapies to zosyn alone (replacing VAN/CFP/MTZ).  Renal failure necessitating initiation of CRRT 5/19  Plan: Transitioning VAN/CFP/MTZ > ZOS at next dosing interval from Cefepime. Initiate Zosyn 3.375g q6h (30-min infusion for CRRT dosing)  Continue to follow RRT plan per nephrology and adjust antibiotic regimen as appropriate. Follow-up cultures and ability to narrow antibiotics  Height: 6\' 3"  (190.5 cm) Weight: (!) 153.6 kg (338 lb 10 oz) IBW/kg (Calculated) : 84.5  Temp (24hrs), Avg:98.3 F (36.8 C), Min:97.9 F (36.6 C), Max:99 F (37.2 C)  Recent Labs  Lab 06/22/21 0408 06/22/21 1855 06/22/21 1857 06/22/21 2220 06/23/21 0425 06/23/21 0716 06/23/21 1140 06/23/21 1519 06/23/21 1815 06/24/21 0338 06/24/21 0339 06/24/21 1406 06/24/21 2100 06/25/21 0320 06/25/21 0332  WBC 10.2  --  6.8  --  19.5*  --   --   --   --  17.4*  --   --   --  16.3*  --   CREATININE 1.96*  --   --    < > 3.17*  --   --  2.96*  --  2.24*  --  1.97*  --   --  1.82*  LATICACIDVEN  --    < >  --    < >  --  >9.0* >9.0*  --  5.5*  --  4.1*  --   --   --  4.1*  VANCORANDOM  --   --   --   --   --   --   --   --   --   --   --   --  12  --   --    < > = values in this interval not displayed.     Estimated Creatinine Clearance: 78.7 mL/min (A) (by C-G formula based on SCr of 1.82 mg/dL (H)).    Allergies  Allergen Reactions   Sulfa Antibiotics    Antimicrobials this admission: Unasyn (5/18) VAN/CFP/MTZ (5/18 >> 5/21) Zosyn (5/21>>  Dose adjustments this admission: N/A; continues CRRT  Microbiology results: 5/16 MRSA  PCR: (-) 5/18 Tracheal aspirate: Mod WBC, Mod GPR, rare GPC (clusters), rare GNR 5/19 Bcx: NGTD  Thank you for allowing pharmacy to be a part of this patient's care.  Shanon Brow Naveah Brave 06/25/2021 3:12 PM

## 2021-06-26 ENCOUNTER — Inpatient Hospital Stay: Payer: BC Managed Care – PPO

## 2021-06-26 DIAGNOSIS — G9341 Metabolic encephalopathy: Secondary | ICD-10-CM | POA: Diagnosis not present

## 2021-06-26 LAB — RENAL FUNCTION PANEL
Albumin: 2.1 g/dL — ABNORMAL LOW (ref 3.5–5.0)
Albumin: 2.1 g/dL — ABNORMAL LOW (ref 3.5–5.0)
Anion gap: 12 (ref 5–15)
Anion gap: 11 (ref 5–15)
BUN: 28 mg/dL — ABNORMAL HIGH (ref 6–20)
BUN: 31 mg/dL — ABNORMAL HIGH (ref 6–20)
CO2: 21 mmol/L — ABNORMAL LOW (ref 22–32)
CO2: 22 mmol/L (ref 22–32)
Calcium: 9.2 mg/dL (ref 8.9–10.3)
Calcium: 9.3 mg/dL (ref 8.9–10.3)
Chloride: 104 mmol/L (ref 98–111)
Chloride: 104 mmol/L (ref 98–111)
Creatinine, Ser: 1.76 mg/dL — ABNORMAL HIGH (ref 0.61–1.24)
Creatinine, Ser: 1.93 mg/dL — ABNORMAL HIGH (ref 0.61–1.24)
GFR, Estimated: 47 mL/min — ABNORMAL LOW (ref >60)
GFR, Estimated: 42 mL/min — ABNORMAL LOW (ref >60)
Glucose, Bld: 123 mg/dL — ABNORMAL HIGH (ref 70–99)
Glucose, Bld: 125 mg/dL — ABNORMAL HIGH (ref 70–99)
Phosphorus: 2.4 mg/dL — ABNORMAL LOW (ref 2.5–4.6)
Phosphorus: 2.6 mg/dL (ref 2.5–4.6)
Potassium: 3.5 mmol/L (ref 3.5–5.1)
Potassium: 3.2 mmol/L — ABNORMAL LOW (ref 3.5–5.1)
Sodium: 137 mmol/L (ref 135–145)
Sodium: 137 mmol/L (ref 135–145)

## 2021-06-26 LAB — CBC
HCT: 40.5 % (ref 39.0–52.0)
Hemoglobin: 13.9 g/dL (ref 13.0–17.0)
MCH: 38.2 pg — ABNORMAL HIGH (ref 26.0–34.0)
MCHC: 34.3 g/dL (ref 30.0–36.0)
MCV: 111.3 fL — ABNORMAL HIGH (ref 80.0–100.0)
Platelets: 85 10*3/uL — ABNORMAL LOW (ref 150–400)
RBC: 3.64 MIL/uL — ABNORMAL LOW (ref 4.22–5.81)
RDW: 18.3 % — ABNORMAL HIGH (ref 11.5–15.5)
WBC: 15.1 10*3/uL — ABNORMAL HIGH (ref 4.0–10.5)
nRBC: 4.6 % — ABNORMAL HIGH (ref 0.0–0.2)

## 2021-06-26 LAB — HEPATIC FUNCTION PANEL
ALT: 251 U/L — ABNORMAL HIGH (ref 0–44)
AST: 665 U/L — ABNORMAL HIGH (ref 15–41)
Albumin: 2.1 g/dL — ABNORMAL LOW (ref 3.5–5.0)
Alkaline Phosphatase: 110 U/L (ref 38–126)
Bilirubin, Direct: 4.2 mg/dL — ABNORMAL HIGH (ref 0.0–0.2)
Indirect Bilirubin: 3 mg/dL — ABNORMAL HIGH (ref 0.3–0.9)
Total Bilirubin: 7.2 mg/dL — ABNORMAL HIGH (ref 0.3–1.2)
Total Protein: 5.5 g/dL — ABNORMAL LOW (ref 6.5–8.1)

## 2021-06-26 LAB — GLUCOSE, CAPILLARY
Glucose-Capillary: 117 mg/dL — ABNORMAL HIGH (ref 70–99)
Glucose-Capillary: 116 mg/dL — ABNORMAL HIGH (ref 70–99)
Glucose-Capillary: 99 mg/dL (ref 70–99)

## 2021-06-26 LAB — MAGNESIUM
Magnesium: 2.2 mg/dL (ref 1.7–2.4)
Magnesium: 2.1 mg/dL (ref 1.7–2.4)

## 2021-06-26 LAB — LACTIC ACID, PLASMA: Lactic Acid, Venous: 3.3 mmol/L (ref 0.5–1.9)

## 2021-06-26 LAB — HEPARIN LEVEL (UNFRACTIONATED): Heparin Unfractionated: 0.55 IU/mL (ref 0.30–0.70)

## 2021-06-26 IMAGING — CT CT HEAD W/O CM
3 series · 17 of 37 positions shown, 19 images · non-contrast
Comparison: Head CT [DATE]

CLINICAL DATA: Mental status change.  Altered mental status.



[Series 2: head bone · axial · 0.44mm/px · z∈[-70,+44]mm · 7 of 83 slices shown]
[im 9/83  bone]
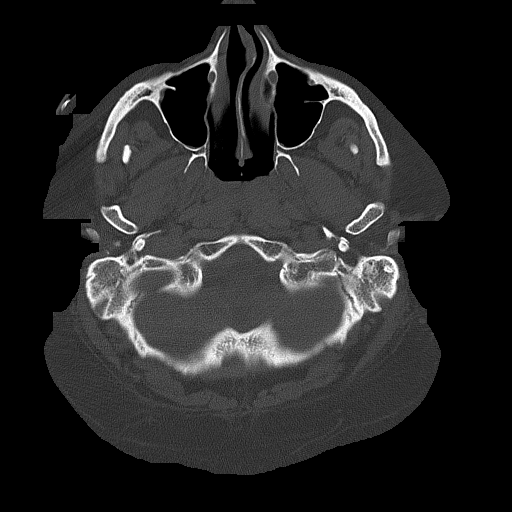
[im 17/83  bone]
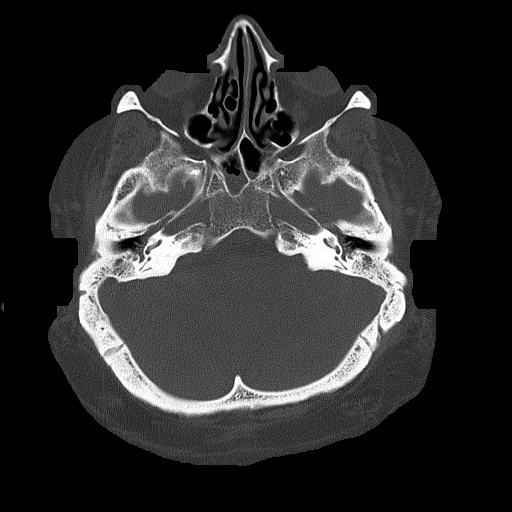
[im 25/83  bone]
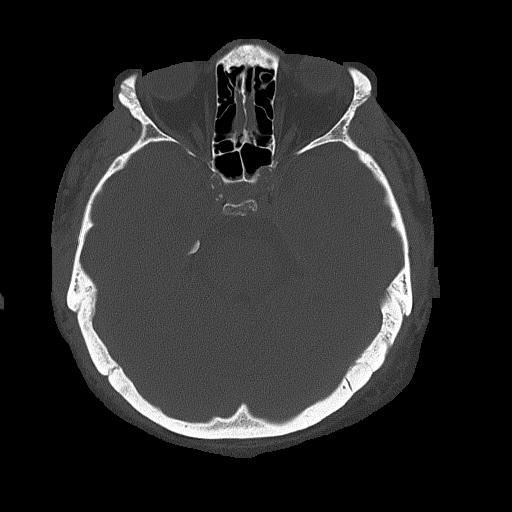
[im 37/83  bone]
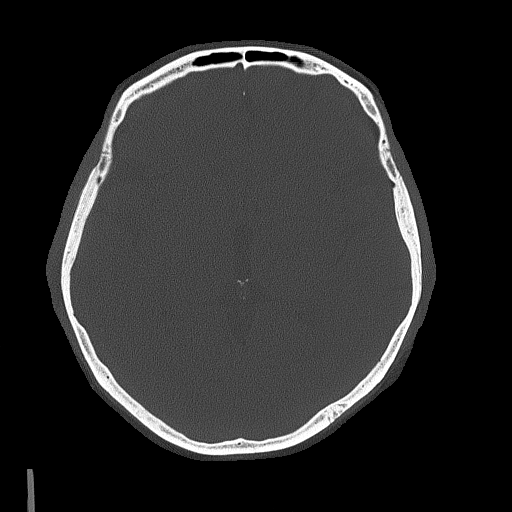
[im 46/83  bone]
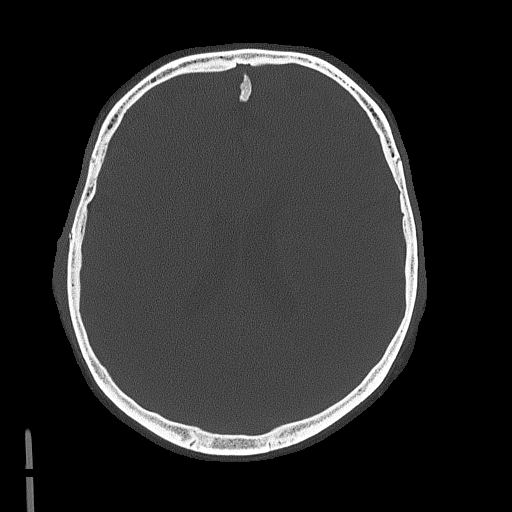
[im 58/83  bone]
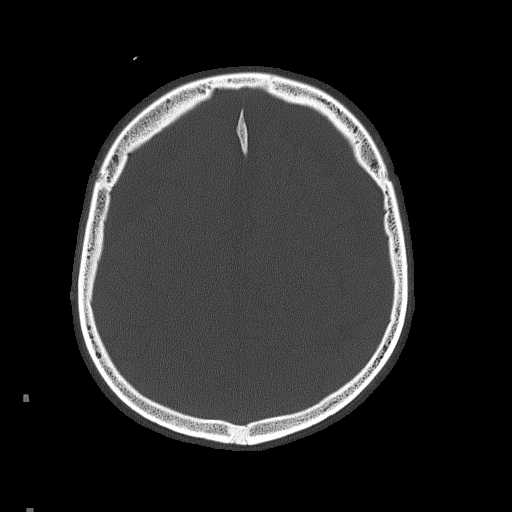
[im 66/83  bone]
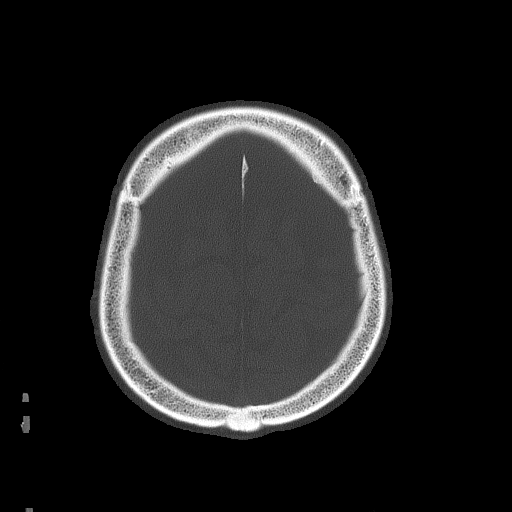

[Series 3: head wo · axial · 0.44mm/px · z∈[-66,+54]mm · 7 of 33 slices shown, 9 images]
[im 5/33  brain]
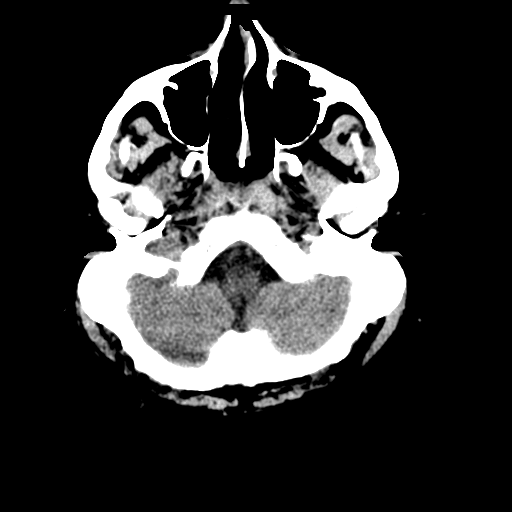
[im 5/33  bone]
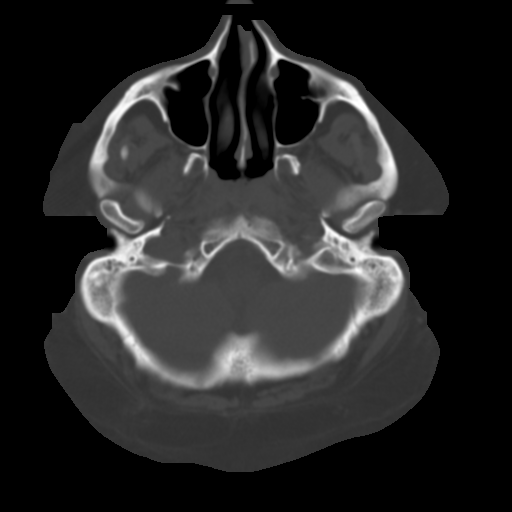
[im 9/33  brain]
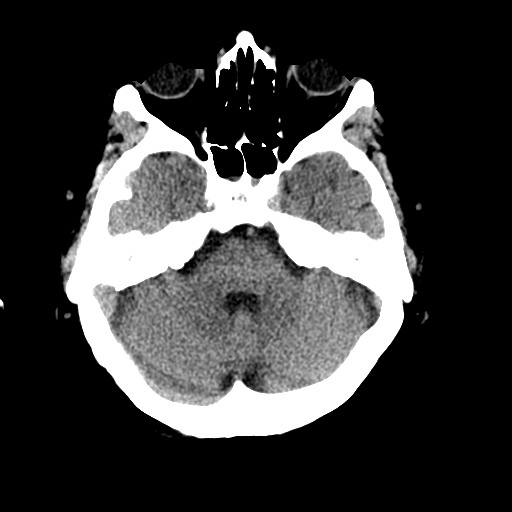
[im 13/33  brain]
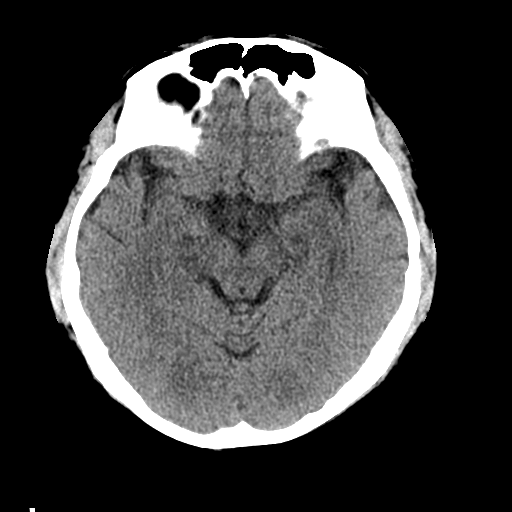
[im 17/33  brain]
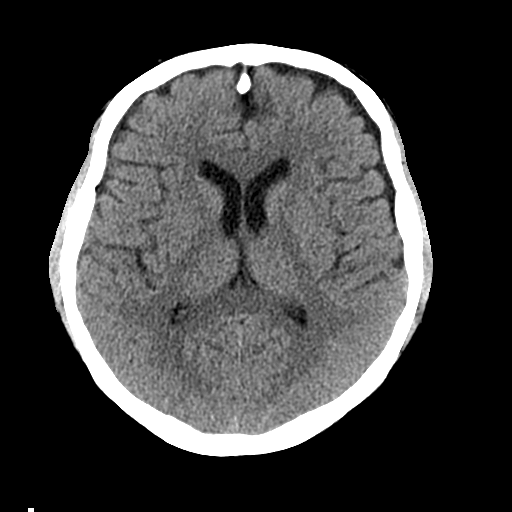
[im 21/33  brain]
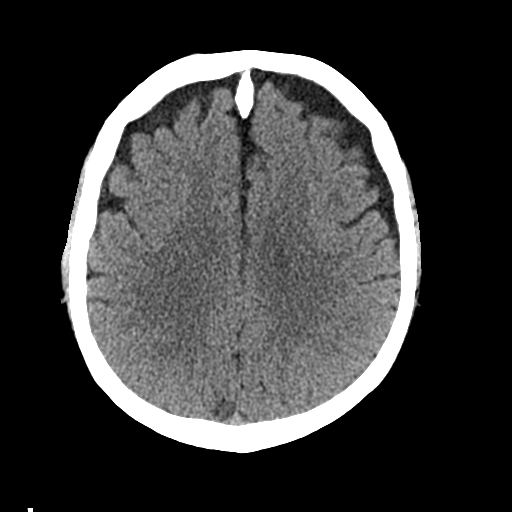
[im 21/33  bone]
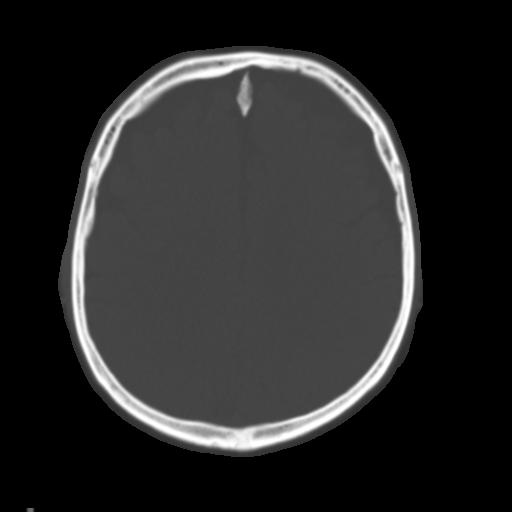
[im 25/33  brain]
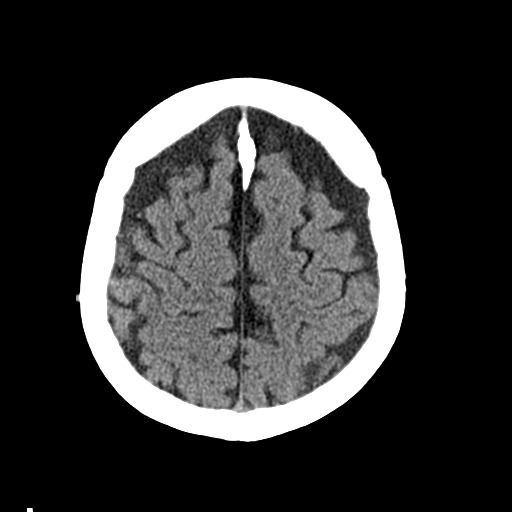
[im 29/33  brain]
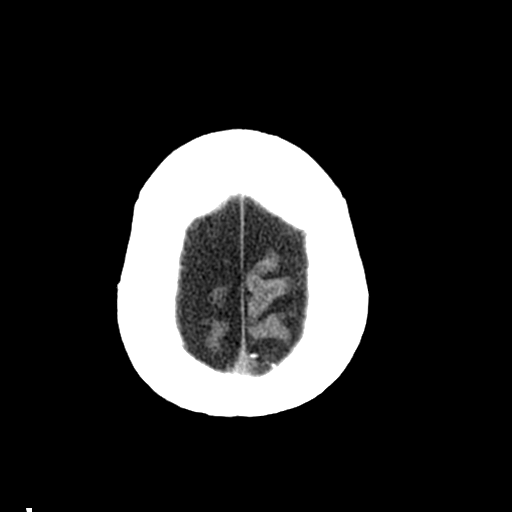

[Series 5: sagittal soft tissue · sagittal · 0.36mm/px · 3 of 68 slices shown]
[im 23/68  brain]
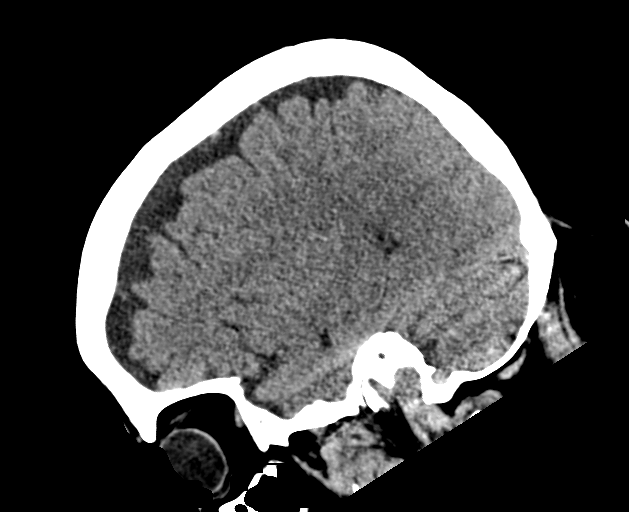
[im 34/68  brain]
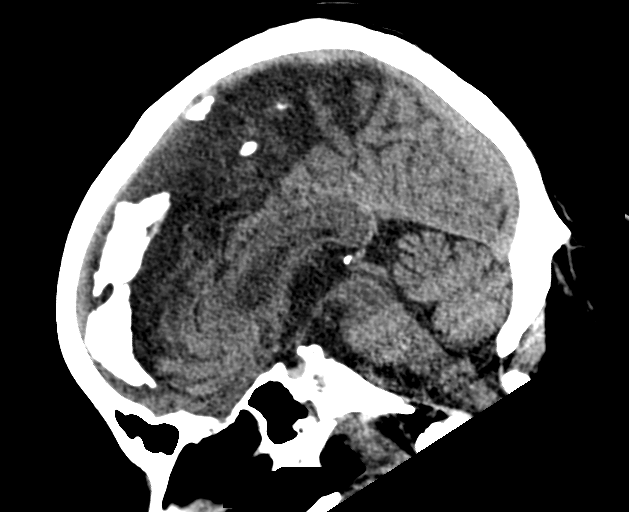
[im 45/68  brain]
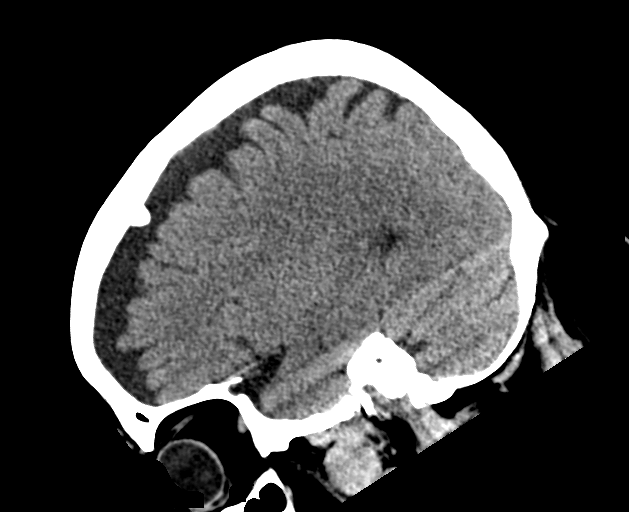

[17 of 37 positions shown; findings below may reference images not displayed]

FINDINGS: Brain: No acute intracranial hemorrhage. No focal mass lesion. No CT
evidence of acute infarction. No midline shift or mass effect. No
hydrocephalus. Basilar cisterns are patent.

Vascular: No hyperdense vessel or unexpected calcification.

Skull: Normal. Negative for fracture or focal lesion.

Sinuses/Orbits: Mucosal thickening in the maxillary sinuses. Orbits
are normal.

Other: None.
IMPRESSION: 1. No acute intracranial findings.
2. No change from CT [DATE].

## 2021-06-26 MED ORDER — VITAL AF 1.2 CAL PO LIQD
1000.0000 mL | ORAL | Status: DC
  Administered 2021-06-26 – 2021-07-05 (×8): 1000 mL

## 2021-06-26 MED ORDER — MIDODRINE HCL 5 MG PO TABS
10.0000 mg | ORAL_TABLET | Freq: Three times a day (TID) | ORAL | Status: DC
Start: 2021-06-26 — End: 2021-06-29
  Administered 2021-06-26 – 2021-06-28 (×7): 10 mg
  Filled 2021-06-26 (×7): qty 2

## 2021-06-26 MED ORDER — DEXMEDETOMIDINE HCL IN NACL 400 MCG/100ML IV SOLN
0.4000 ug/kg/h | INTRAVENOUS | Status: DC
  Administered 2021-06-26: 0.4 ug/kg/h via INTRAVENOUS
  Filled 2021-06-26: qty 100

## 2021-06-26 MED ORDER — SODIUM CHLORIDE 0.9 % IV SOLN
Freq: Two times a day (BID) | INTRAVENOUS | Status: DC
  Filled 2021-06-26 (×6): qty 100

## 2021-06-26 MED ORDER — PROSOURCE TF PO LIQD
90.0000 mL | Freq: Four times a day (QID) | ORAL | Status: DC
Start: 2021-06-26 — End: 2021-07-06
  Administered 2021-06-26 – 2021-07-05 (×34): 90 mL
  Filled 2021-06-26: qty 90

## 2021-06-26 MED ORDER — SODIUM CHLORIDE 0.9 % IV SOLN
3.0000 g | Freq: Three times a day (TID) | INTRAVENOUS | Status: AC
  Administered 2021-06-27 – 2021-06-28 (×6): 3 g via INTRAVENOUS
  Filled 2021-06-26 (×2): qty 8
  Filled 2021-06-26 (×2): qty 3
  Filled 2021-06-26 (×3): qty 8

## 2021-06-26 MED ORDER — RIFAXIMIN 550 MG PO TABS
550.0000 mg | ORAL_TABLET | Freq: Two times a day (BID) | ORAL | Status: DC
Start: 2021-06-26 — End: 2021-07-07
  Administered 2021-06-26 – 2021-07-05 (×19): 550 mg
  Filled 2021-06-26 (×19): qty 1

## 2021-06-26 MED ORDER — POTASSIUM & SODIUM PHOSPHATES 280-160-250 MG PO PACK
1.0000 | PACK | Freq: Three times a day (TID) | ORAL | Status: DC
  Administered 2021-06-26 – 2021-06-27 (×7): 1
  Filled 2021-06-26 (×7): qty 1

## 2021-06-26 MED ORDER — MIDODRINE HCL 5 MG PO TABS: 10.0000 mg | ORAL_TABLET | Freq: Three times a day (TID) | ORAL | Status: DC

## 2021-06-26 MED ORDER — HEPARIN SODIUM (PORCINE) 5000 UNIT/ML IJ SOLN
5000.0000 [IU] | Freq: Two times a day (BID) | INTRAMUSCULAR | Status: DC
  Administered 2021-06-26 – 2021-07-07 (×21): 5000 [IU] via SUBCUTANEOUS
  Filled 2021-06-26 (×21): qty 1

## 2021-06-26 MED ORDER — POTASSIUM CHLORIDE 20 MEQ PO PACK
40.0000 meq | PACK | Freq: Once | ORAL | Status: AC
  Administered 2021-06-26: 40 meq
  Filled 2021-06-26 (×2): qty 2

## 2021-06-26 NOTE — Consult Note (Signed)
[facility] for Electrolyte Monitoring and Replacement   Recent Labs: Potassium (mmol/L)  Date Value  06/26/2021 3.5   Magnesium (mg/dL)  Date Value  06/26/2021 2.2   Calcium (mg/dL)  Date Value  06/26/2021 9.2   Albumin (g/dL)  Date Value  06/26/2021 2.1 (L)   Phosphorus (mg/dL)  Date Value  06/26/2021 2.4 (L)   Sodium (mmol/L)  Date Value  06/26/2021 137   Assessment: Patient is a 51 y/o M with medical history including obesity, chronic back pain, EtOH use disorder who presented to the ED 5/16 with weakness in setting of abdominal cramps, nausea, vomiting, and diarrhea. Patient subsequently admitted for severe symptomatic hyponatremia. Patient decompensated on 5/18 with acute onset respiratory failure requiring intubation. Patient remains admitted to the ICU where he is intubated, sedated, and on mechanical ventilation. There is further concern for septic shock and patient is requiring multiple vasopressor infusions. Admission further complicated by acute renal failure necessitating initiation of CRRT on 5/19. Pharmacy consulted to assist with electrolyte monitoring and replacement as indicated.  Nutrition: Tube feeds initiated 5/22 Cathartics: Lactulose 30 g TID for hepatic encephalopathy  Goal of Therapy:  Electrolytes within normal limits  Plan:  --Phos 2.4, will schedule Phos-Nak 1 packet TIDHS for now given on-going CRRT and initiation of tube feeds today (risk for re-feeding) --Renal function panels ordered BID while on CRRT  Benita Gutter 06/26/2021 9:09 AM

## 2021-06-26 NOTE — Progress Notes (Signed)
NAME:  Robert Coffey, MRN:  [id number], DOB:  Nov 28, 1970, LOS: 6 ADMISSION DATE:  06/13/2021, CONSULTATION DATE:  06/22/2021 REFERRING MD:  Dr. Jimmye Norman, CHIEF COMPLAINT:  Acute Respiratory Distress, AMS   Brief Pt Description / Synopsis:  51 year old male admitted with acute metabolic encephalopathy secondary to alcohol withdrawal and severe hyponatremia requiring hypertonic saline.  On 5/18 developed acute hypoxic & hypercapnic respiratory failure requiring intubation and mechanical ventilation.  History of Present Illness:  Robert Coffey is a 51 year old male with a past medical history significant for alcohol abuse, obesity, chronic back pain, peripheral neuropathy who presented to [facility] ED on 07/04/2021 due to altered mental status, poor p.o. intake, progressive weakness with difficulty getting around at home.  Patient is now intubated and unable to contribute to history, therefore history is obtained from patient's wife at bedside and chart review.  Per the patient's wife, the patient has not been feeling well with poor p.o. intake for approximately a month, however has continued to drink alcohol daily (usually 3-4 drinks of liquor) during that time.  Over the past several days he has become progressively weak with difficulty getting around at home (not even in bed able to get out of bed), intermittent confusion, and diffuse abdominal cramping/nausea/vomiting/diarrhea.  Denies any history of falls, chest pain, shortness of breath, fever, cough.  Last known alcoholic drink was the day prior to admission on Sunday.  ED Course: Initial Vital Signs: Temperature 98.1 F orally, blood pressure 136/119, respiratory rate 18, pulse 106, SPO2 95% Significant Labs: Sodium 109, chloride 69, glucose 108, BUN 8, creatinine 1.62, anion gap 17, albumin 3.1, lipase 86, AST 256, ALT 119, total bilirubin 5.0, high-sensitivity troponin 67, serum osmolality 236, urine osmolality 437, urine sodium less than 10 WBC  11.0, platelets 129, TSH 2.1 COVID-19 and influenza PCR negative Urinalysis negative for UTI Urine drug screen positive for tricyclics Ethyl alcohol less than 10 Imaging CT head without contrast>>IMPRESSION: 1. Fluid or debris in the left sinus tympani/middle ear, cannot exclude otitis media. There small bilateral mastoid effusions and chronic bilateral maxillary sinusitis. 2. Advanced for age cerebral atrophy. 3. Atherosclerosis. CT abdomen and pelvis>>IMPRESSION: Hepatic steatosis. 2.6 cm enhancing left adrenal nodule is noted. When the patient is clinically stable and able to follow directions and hold their breath (preferably as an outpatient) further evaluation with dedicated abdominal MRI should be considered. Medications Administered: 1 L normal saline bolus, hypertonic saline infusion  He was admitted by the hospitalist for further work-up and treatment of acute metabolic encephalopathy in the setting of DTs and severe hyponatremia.  Please see "significant hospital events" section below for full detailed hospital course.  Pertinent  Medical History  Alcohol abuse Obesity Chronic back  Micro Data:  5/16: SARS-CoV-2 and influenza PCR>> negative 5/16: HIV screen>> nonreactive 5/16: MRSA PCR>> negative 5/18: Tracheal aspirate>>gram + rods & cocci, gram - rods 5/19: Blood x2>>  Antimicrobials:  5/18: Unasyn>>x2 doses  5/18: Cefepime>> 5/18: Vancomycin>> 5/18: Flagyl>>  Significant Hospital Events: Including procedures, antibiotic start and stop dates in addition to other pertinent events   5/16: Admitted by hospitalist for acute metabolic encephalopathy in setting of severe hyponatremia and DTs.  Requiring hypertonic saline 5/18: Developed acute hypoxic respiratory failure, possible concern for aspiration.  Required intubation and mechanical ventilation. 5/19: Pt with worsening acute renal failure with severe metabolic and lactic acidosis requiring CRRT.  Severely  hypotensive requiring epinephrine, levophed, and vasopressin gtts to maintain map >65  5/20: Remains critically ill, on CRRT. Vent support  slightly weaned to 60% FiO2 & 10 PEEP. Some improvement in vasopressor requirements (off of epinephrine and phenylephrine) 5/21: Overnight weaned off of Giapreza, currently only on norepinephrine and vasopressin 5/22: No acute events overnight on minimal ventilator settings FiO2 30% PEEP 8.  Remains on levophed gtt to maintain map >65.  Will perform WUA   Interim History / Subjective:  No acute events overnight on minimal ventilator settings FiO2 30% PEEP 8.  Levophed gtt infusing to maintain map >65.  Sedated on versed and fentanyl gtt.  Plans for WUA today   Objective   Blood pressure 102/67, pulse (!) 112, temperature 99.3 F (37.4 C), resp. rate (!) 22, height $RemoveBe'6\' 3"'nQatTTuGF$  (1.905 m), weight (!) 151.3 kg, SpO2 92 %.    Vent Mode: PRVC FiO2 (%):  [30 %] 30 % Set Rate:  [22 bmp] 22 bmp Vt Set:  [650 mL] 650 mL PEEP:  [8 cmH20] 8 cmH20 Plateau Pressure:  [19 cmH20-24 cmH20] 19 cmH20   Intake/Output Summary (Last 24 hours) at 06/26/2021 0809 Last data filed at 06/26/2021 0700 Gross per 24 hour  Intake 2049.89 ml  Output 1597 ml  Net 452.89 ml   Filed Weights   06/24/21 0321 06/25/21 0325 06/26/21 0500  Weight: (!) 153.8 kg (!) 153.6 kg (!) 151.3 kg   Examination: General: Critically ill-appearing obese male, laying in bed, NAD mechanically intubated  HENT: Atraumatic, normocephalic, neck supple, difficult to assess JVD due to body habitus Lungs: Faint rhonchi throughout, even, non labored  Cardiovascular: NSR, rrr no murmurs, rubs, gallops, 1+ generalized edema  Abdomen: +BS x4, obese, soft, non distended  Extremities: Normal bulk and tone Neuro: Sedated, not following commands, PERRL  GU: Indwelling foley catheter draining yellow urine   Imaging     Assessment & Plan:  Acute hypoxic & hypercapnic respiratory failure in the setting of suspected  aspiration in the setting of severe DTs Pulmonary Embolism - cannot r/o -Full vent support, on lung protective strategies -Plateau pressures less than 30 cm H20 -Wean FiO2 & PEEP as tolerated to maintain O2 sats >92% -Follow intermittent Chest X-ray & ABG as needed -Spontaneous Breathing Trials when respiratory parameters met and mental status permits -Continue VAP Bundle -PRN Bronchodilators -Continue abx as outlined above  -Treating empirically with heparin gtt for possible PE given hx of poor mobility prior to admission (unable to obtain CTA Chest due to AKI and hemodynamic instability) -Venous US Bilateral LE negative for DVT  Septic shock~improving   Elevated troponin suspect secondary to demand ischemia -Continuous cardiac monitoring -Maintain MAP >65 -Vasopressors as needed to maintain MAP goal -Stress dose steroids -Trend lactic acid until normalized -HS Troponin peaked at 67 -Echocardiogram 06/23/21: LVEF 60-65%, Grade I DD, normal RV systolic function  Severe Sepsis due to Suspected aspiration CT head 5/16 unable to exclude left otitis media and chronic bilateral maxillary sinusitis  Respiratory culture 05/18>>few normal respiratory flora-no staph aureus or   pseudomonas seen  -Monitor fever curve -Trend WBC's & Procalcitonin -Follow cultures as above -Continue zosyn  Hypotonic hyponatremia, suspect secondary to dehydration & poor p.o. intake~RESOLVED Acute kidney injury secondary to ATN with severe metabolic acidosis  Lactic acidosis persistent likely due to hepatic impairment -Trend BMP, lactic acid, and ABG -Persistent lactic acid elevation likely due to hepatic dysfunction -Replace electrolytes as indicated  -Monitor UOP -Nephrology following, appreciate input: continue CRRT   Transaminitis secondary to hepatic steatosis & ETOH abuse & Shock liver -Trend LFTs and coags -AFP tumor marker normal  Thrombocytopenia,  suspect secondary to alcohol abuse -Trend  CBC -Monitor for s/sx of bleeding and transfuse for Hgb <7 -Transfuse platelets for platelet count less than 50 with active bleeding  Incidental finding of left adrenal nodule via CT Abd/Pelvis  -Will need abdominal MRI for further evaluation in the outpatient setting   Acute metabolic encephalopathy in the setting of severe DTs and severe hyponatremia Sedation needs in the setting of mechanical ventilation -Maintain a RASS goal of 0 to -1 -Fentanyl and versed as needed to maintain RASS goal -Avoid sedating medications as able -Daily wake up assessment -Continue thiamine  -Continue folic acid and multivitamin -Continue CIWA protocol -Continue lactulose and trend ammonia level   Morbid obesity with sarcopenia Protein malnutrition -Calorie excess, protein deficiency in the setting of alcoholism -Dietitian consulted for initiation of TF's  Best Practice (right click and "Reselect all SmartList Selections" daily)  Diet/type: NPO DVT prophylaxis: Heparin gtt  GI prophylaxis: PPI Lines: Central line, and is still needed Foley:  Yes, and it is still needed Code Status: Full Code Last date of multidisciplinary goals of care discussion [06/26/21]  Updated pts wife at bedside regarding pts condition and guarded overall prognosis.  Daily updates will be continued.  Palliative Care consulted Labs   CBC: Recent Labs  Lab 07/01/2021 0629 06/21/21 0331 06/22/21 1857 06/23/21 0425 06/24/21 0338 06/25/21 0320 06/26/21 0316  WBC 11.0*   < > 6.8 19.5* 17.4* 16.3* 15.1*  NEUTROABS 8.1*  --   --   --   --   --   --   HGB 15.3   < > 14.0 13.9 13.9 14.0 13.9  HCT 41.7   < > 41.8 42.3 42.0 42.1 40.5  MCV 102.2*   < > 111.5* 116.5* 112.6* 114.1* 111.3*  PLT 129*   < > 132* 133* 117* 107* 85*   < > = values in this interval not displayed.    Basic Metabolic Panel: Recent Labs  Lab 06/23/21 0425 06/23/21 0717 06/24/21 0338 06/24/21 1406 06/25/21 0320 06/25/21 0332 06/25/21 1603  06/26/21 0316  NA 131*   < > 135 135  --  135 137 137  K 4.5   < > 4.3 4.7  --  4.3 3.9 3.5  CL 95*   < > 101 102  --  104 104 104  CO2 15*   < > 22 22  --  21* 22 21*  GLUCOSE 140*   < > 121* 117*  --  118* 121* 123*  BUN 38*   < > 26* 23*  --  25* 27* 28*  CREATININE 3.17*   < > 2.24* 1.97*  --  1.82* 1.77* 1.76*  CALCIUM 8.1*   < > 7.8* 7.6*  --  8.2* 9.0 9.2  MG 2.2  --  2.4  --  2.3  --  2.2 2.2  PHOS  --    < > 2.1* 2.1*  --  2.6 2.6 2.4*   < > = values in this interval not displayed.   GFR: Estimated Creatinine Clearance: 80.7 mL/min (A) (by C-G formula based on SCr of 1.76 mg/dL (H)). Recent Labs  Lab 06/23/21 0425 06/23/21 0716 06/23/21 0717 06/23/21 1140 06/23/21 1815 06/24/21 0338 06/24/21 0339 06/25/21 0320 06/25/21 0332 06/26/21 0316  PROCALCITON  --   --  16.69  --   --  7.12  --  5.62  --   --   WBC 19.5*  --   --   --   --  17.4*  --  16.3*  --  15.1*  LATICACIDVEN  --    < >  --    < > 5.5*  --  4.1*  --  4.1* 3.3*   < > = values in this interval not displayed.    Liver Function Tests: Recent Labs  Lab 06/17/2021 0629 06/23/21 0425 06/23/21 1519 06/24/21 0339 06/24/21 1406 06/25/21 0320 06/25/21 0332 06/25/21 1603 06/26/21 0316  AST 256* 752*  --  1,148*  --  1,000*  --   --   --   ALT 119* 175*  --  280*  --  289*  --   --   --   ALKPHOS 116 107  --  138*  --  125  --   --   --   BILITOT 5.0* 6.3*  --  8.0*  --  7.9*  --   --   --   PROT 6.8 5.9*  --  6.0*  --  5.8*  --   --   --   ALBUMIN 3.1* 2.5*   < > 2.4* 2.2* 2.3* 2.2* 2.2* 2.1*   < > = values in this interval not displayed.   Recent Labs  Lab 06/18/2021 0629  LIPASE 86*   Recent Labs  Lab 06/22/21 1531 06/23/21 1140 06/25/21 0332  AMMONIA 96* 40* 85*    ABG    Component Value Date/Time   PHART 7.39 06/25/2021 0508   PCO2ART 36 06/25/2021 0508   PO2ART 99 06/25/2021 0508   HCO3 21.8 06/25/2021 0508   ACIDBASEDEF 2.6 (H) 06/25/2021 0508   O2SAT 99.5 06/25/2021 0508       Coagulation Profile: Recent Labs  Lab 06/22/21 2010 06/23/21 0425  INR 1.5* 2.0*    Cardiac Enzymes: No results for input(s): CKTOTAL, CKMB, CKMBINDEX, TROPONINI in the last 168 hours.  HbA1C: No results found for: HGBA1C  CBG: Recent Labs  Lab 06/25/21 1146 06/25/21 1542 06/25/21 1935 06/25/21 2318 06/26/21 0324  GLUCAP 121* 133* 126* 129* 117*    Review of Systems:   Unable to assess due to intubation/sedation/critical illness  Allergies Allergies  Allergen Reactions   Sulfa Antibiotics      Home Medications  Prior to Admission medications   Medication Sig Start Date End Date Taking? Authorizing Provider  atorvastatin (LIPITOR) 20 MG tablet Take 20 mg by mouth daily. 02/03/21  Yes [provider]  cyanocobalamin 1000 MCG tablet Take by mouth.   Yes [provider]  hydrochlorothiazide (HYDRODIURIL) 25 MG tablet Take 25 mg by mouth daily. 05/30/21  Yes [provider]  lisinopril (ZESTRIL) 40 MG tablet Take 40 mg by mouth daily. 05/04/21  Yes [provider]  rOPINIRole (REQUIP) 0.25 MG tablet Take 0.25 mg by mouth 3 (three) times daily. 07/28/20  Yes [provider]  vitamin C (ASCORBIC ACID) 500 MG tablet Take 500 mg by mouth daily.   Yes [provider]  zolpidem (AMBIEN) 10 MG tablet Take 10 mg by mouth at bedtime as needed. 06/07/21  Yes [provider]     Scheduled Meds:  chlorhexidine gluconate (MEDLINE KIT)  15 mL Mouth Rinse BID   Chlorhexidine Gluconate Cloth  6 each Topical F6812   folic acid  1 mg Intravenous Daily   free water  30 mL Per Tube Q4H   hydrocortisone sod succinate (SOLU-CORTEF) inj  100 mg Intravenous Q8H   lactulose  30 g Per Tube TID   mouth rinse  15 mL Mouth  Rinse 10 times per day   multivitamin with minerals  1 tablet Per Tube Daily   pantoprazole sodium  40 mg Per Tube Daily   potassium & sodium phosphates  1 packet Per Tube TID WC & HS   senna-docusate  1 tablet  Per Tube QHS   [START ON 06/27/2021] thiamine injection  100 mg Intravenous Q24H   Continuous Infusions:  sodium chloride 10 mL/hr at 06/26/21 0700   sodium chloride 10 mL/hr at 06/25/21 1126   fentaNYL infusion INTRAVENOUS 50 mcg/hr (06/25/21 2330)   heparin 1,750 Units/hr (06/26/21 0700)   midazolam 2 mg/hr (06/26/21 0700)   norepinephrine (LEVOPHED) Adult infusion 3 mcg/min (06/26/21 0700)   piperacillin-tazobactam Stopped (06/26/21 0538)   prismasol BGK 2/2.5 dialysis solution 500 mL/hr at 06/26/21 0429   prismasol BGK 2/2.5 dialysis solution 500 mL/hr at 06/26/21 0429   prismasol BGK 2/2.5 dialysis solution 2,500 mL/hr at 06/26/21 0736   vasopressin Stopped (06/26/21 0331)   PRN Meds:.sodium chloride, acetaminophen **OR** acetaminophen, fentaNYL, heparin, midazolam, ondansetron **OR** ondansetron (ZOFRAN) IV   Critical care time: 40 minutes     Donell Beers, [place] Pager [phone] (please enter 7 digits) PCCM Consult Pager [phone] (please enter 7 digits)

## 2021-06-26 NOTE — Progress Notes (Signed)
[facility]  PROGRESS NOTE   Subjective:   Patient seen and evaluated at the bedside in ICU.  Intubated. Sedation held  Wife at bedside Remains on CRRT Pressors in place, Levo  Foley catheter  Objective:  Vital signs: Blood pressure 117/73, pulse 96, temperature 99.1 F (37.3 C), resp. rate (!) 22, height 6' 3" (1.905 m), weight (!) 151.3 kg, SpO2 94 %.  Intake/Output Summary (Last 24 hours) at 06/26/2021 1247 Last data filed at 06/26/2021 1100 Gross per 24 hour  Intake 1768.9 ml  Output 2091 ml  Net -322.1 ml    Filed Weights   06/24/21 0321 06/25/21 0325 06/26/21 0500  Weight: (!) 153.8 kg (!) 153.6 kg (!) 151.3 kg     Physical Exam: General:  No acute distress  Head:  Normocephalic, atraumatic. Moist oral mucosal membranes  Eyes:  Anicteric  Lungs:   Clear to auscultation, normal effort  Heart:  S1S2 no rubs  Abdomen:   Soft, nontender, bowel sounds present  Extremities:  peripheral edema.  Neurologic: Unresponsive on the vent.  Skin:  No lesions  Access: Rt IJ Temp cath    Basic Metabolic Panel: Recent Labs  Lab 06/23/21 0425 06/23/21 0717 06/24/21 0338 06/24/21 1406 06/25/21 0320 06/25/21 0332 06/25/21 1603 06/26/21 0316  NA 131*   < > 135 135  --  135 137 137  K 4.5   < > 4.3 4.7  --  4.3 3.9 3.5  CL 95*   < > 101 102  --  104 104 104  CO2 15*   < > 22 22  --  21* 22 21*  GLUCOSE 140*   < > 121* 117*  --  118* 121* 123*  BUN 38*   < > 26* 23*  --  25* 27* 28*  CREATININE 3.17*   < > 2.24* 1.97*  --  1.82* 1.77* 1.76*  CALCIUM 8.1*   < > 7.8* 7.6*  --  8.2* 9.0 9.2  MG 2.2  --  2.4  --  2.3  --  2.2 2.2  PHOS  --    < > 2.1* 2.1*  --  2.6 2.6 2.4*   < > = values in this interval not displayed.     CBC: Recent Labs  Lab 06/14/2021 0629 06/21/21 0331 06/22/21 1857 06/23/21 0425 06/24/21 0338 06/25/21 0320 06/26/21 0316  WBC 11.0*   < > 6.8 19.5* 17.4* 16.3* 15.1*  NEUTROABS 8.1*  --   --   --   --   --   --   HGB 15.3   <  > 14.0 13.9 13.9 14.0 13.9  HCT 41.7   < > 41.8 42.3 42.0 42.1 40.5  MCV 102.2*   < > 111.5* 116.5* 112.6* 114.1* 111.3*  PLT 129*   < > 132* 133* 117* 107* 85*   < > = values in this interval not displayed.      Urinalysis: No results for input(s): COLORURINE, LABSPEC, PHURINE, GLUCOSEU, HGBUR, BILIRUBINUR, KETONESUR, PROTEINUR, UROBILINOGEN, NITRITE, LEUKOCYTESUR in the last 72 hours.  Invalid input(s): APPERANCEUR    Imaging: CT HEAD WO CONTRAST (5MM)  Result Date: 06/26/2021 CLINICAL DATA:  Mental status change.  Altered mental status. EXAM: CT HEAD WITHOUT CONTRAST TECHNIQUE: Contiguous axial images were obtained from the base of the skull through the vertex without intravenous contrast. RADIATION DOSE REDUCTION: This exam was performed according to the departmental dose-optimization program which includes automated exposure control, adjustment of the mA and/or kV  according to patient size and/or use of iterative reconstruction technique. COMPARISON:  Head CT 06/21/2021 FINDINGS: Brain: No acute intracranial hemorrhage. No focal mass lesion. No CT evidence of acute infarction. No midline shift or mass effect. No hydrocephalus. Basilar cisterns are patent. Vascular: No hyperdense vessel or unexpected calcification. Skull: Normal. Negative for fracture or focal lesion. Sinuses/Orbits: Mucosal thickening in the maxillary sinuses. Orbits are normal. Other: None. IMPRESSION: 1. No acute intracranial findings. 2. No change from CT 06/13/2021. Electronically Signed   By: Stewart  Edmunds M.D.   On: 06/26/2021 12:12   DG Chest Port 1 View  Result Date: 06/25/2021 CLINICAL DATA:  Hypoxic respiratory failure. EXAM: PORTABLE CHEST 1 VIEW COMPARISON:  06/23/2021. FINDINGS: 4:56 a.m., 06/25/2021.  ETT tip is 3.7 cm from the carina. Enteric tube enters the stomach with intragastric course not evaluated. Bilateral IJ catheters are stable in positioning, the left with tip at the azygous confluence, the  right with tip in the distal SVC. There is mild cardiomegaly, normal caliber of the central vessels with decreased vascular prominence in the interval. No pleural effusion is seen. There is increased opacity in the right suprahilar and left infrahilar areas today concerning for pneumonia. The remaining lungs clear. In all other respects no further changes. IMPRESSION: 1. Increased opacity in the right suprahilar and left infrahilar areas concerning for pneumonia. 2. Central vessels are normal in caliber, previously were engorged. 3. Tubes and lines as above. Electronically Signed   By: Keith  Chesser M.D.   On: 06/25/2021 07:04     Medications:    sodium chloride 250 mL (06/26/21 1113)   sodium chloride 10 mL/hr at 06/26/21 1119   [START ON 06/27/2021] ampicillin-sulbactam (UNASYN) IV     dexmedetomidine (PRECEDEX) IV infusion     feeding supplement (VITAL AF 1.2 CAL)     fentaNYL infusion INTRAVENOUS Stopped (06/26/21 0819)   norepinephrine (LEVOPHED) Adult infusion 6 mcg/min (06/26/21 1100)   piperacillin-tazobactam 3.375 g (06/26/21 1238)   prismasol BGK 2/2.5 dialysis solution 500 mL/hr at 06/26/21 0429   prismasol BGK 2/2.5 dialysis solution 500 mL/hr at 06/26/21 0429   prismasol BGK 2/2.5 dialysis solution 2,500 mL/hr at 06/26/21 0934   sodium chloride 0.9 % 100 mL with hydrocortisone sodium succinate (SOLU-CORTEF) 100 mg infusion     vasopressin Stopped (06/26/21 0331)    chlorhexidine gluconate (MEDLINE KIT)  15 mL Mouth Rinse BID   Chlorhexidine Gluconate Cloth  6 each Topical Q0600   feeding supplement (PROSource TF)  90 mL Per Tube QID   folic acid  1 mg Intravenous Daily   free water  30 mL Per Tube Q4H   heparin injection (subcutaneous)  5,000 Units Subcutaneous Q12H   lactulose  30 g Per Tube TID   mouth rinse  15 mL Mouth Rinse 10 times per day   midodrine  10 mg Per Tube TID WC   multivitamin with minerals  1 tablet Per Tube Daily   pantoprazole sodium  40 mg Per Tube  Daily   potassium & sodium phosphates  1 packet Per Tube TID WC & HS   rifaximin  550 mg Per Tube BID   [START ON 06/27/2021] thiamine injection  100 mg Intravenous Q24H    Assessment/ Plan:     Principal Problem:   Acute metabolic encephalopathy Active Problems:   Acute kidney injury (HCC)   Hyponatremia   Alcohol withdrawal (HCC)   Transaminitis   50 y.o.  male with past medical history including alcohol abuse,   chronic back pain, and peripheral neuropathy, who was admitted to [facility] on 06/24/2021 for Hyponatremia, Elevated liver function tests. Generalized weakness, AKI (acute kidney injury) and Acute metabolic encephalopathy.    #1: Acute kidney injury due to ETOH abuse, poor nutrition and severe illness. Remains anuric on CRRT. Continue current treatments and we will monitor closely.   #2: Hypotension/shock: Blood pressure 109/63 on levophed.    #3: Hyponatremia: Corrected with hypertonic saline.   #4: Acute respiratory failure with possible pneumonia: Continue antibiotics as ordered.  Weaning trials underway.  #5: Transaminitis secondary to liver failure/shock liver: Most likely secondary to EtOH abuse.   LOS: 6 Shantelle Breeze [facility] 5/22/202312:47 PM   

## 2021-06-26 NOTE — Plan of Care (Signed)
Neuro: sedation turned off this AM, no response to physical/painful stimuli, intermittent cough and gag present Resp: tolerating ventilator well CV: afebrile, hypothermic this evening-bair huggar applied, BP being managed with Levophed, otherwise vital signs fairly stable GIGU: foley and flexiseal in place-tolerating well, tube feeds started today-tolerating well Skin: intact, DTI buttocks-foam applied Social: Friends and family visiting throughout the day, all questions and concerns addressed  Events: travelled to [place] without incident Precedex started for comfort and ventilator dyssynchrony, discontinued due to bradycardia to the 30s at shift change  Problem: Education: Goal: Knowledge of General Education information will improve Description: Including pain rating scale, medication(s)/side effects and non-pharmacologic comfort measures Outcome: Not Progressing   Problem: Health Behavior/Discharge Planning: Goal: Ability to manage health-related needs will improve Outcome: Not Progressing   Problem: Clinical Measurements: Goal: Ability to maintain clinical measurements within normal limits will improve Outcome: Not Progressing Goal: Will remain free from infection Outcome: Not Progressing Goal: Diagnostic test results will improve Outcome: Not Progressing Goal: Respiratory complications will improve Outcome: Not Progressing Goal: Cardiovascular complication will be avoided Outcome: Not Progressing   Problem: Activity: Goal: Risk for activity intolerance will decrease Outcome: Not Progressing   Problem: Nutrition: Goal: Adequate nutrition will be maintained Outcome: Not Progressing   Problem: Coping: Goal: Level of anxiety will decrease Outcome: Not Progressing   Problem: Elimination: Goal: Will not experience complications related to bowel motility Outcome: Not Progressing Goal: Will not experience complications related to urinary retention Outcome: Not Progressing    Problem: Pain Managment: Goal: General experience of comfort will improve Outcome: Not Progressing   Problem: Safety: Goal: Ability to remain free from injury will improve Outcome: Not Progressing   Problem: Skin Integrity: Goal: Risk for impaired skin integrity will decrease Outcome: Not Progressing

## 2021-06-26 NOTE — Progress Notes (Signed)
ANTICOAGULATION CONSULT NOTE   Pharmacy Consult for IV Heparin Indication: pulmonary embolus (suspicion for, not confirmed)  Patient Measurements: Height: 6\' 3"  (190.5 cm) Weight: (!) 151.3 kg (333 lb 8.9 oz) IBW/kg (Calculated) : 84.5 Heparin Dosing Weight: 117.5 kg  Labs: Recent Labs    06/24/21 0338 06/24/21 1406 06/24/21 2130 06/25/21 0320 06/25/21 0332 06/25/21 1603 06/26/21 0316  HGB 13.9  --   --  14.0  --   --  13.9  HCT 42.0  --   --  42.1  --   --  40.5  PLT 117*  --   --  107*  --   --  85*  HEPARINUNFRC 0.75*   < > 0.58 0.56  --   --  0.55  CREATININE 2.24*   < >  --   --  1.82* 1.77* 1.76*   < > = values in this interval not displayed.   Heparin Dosing Weight: 117.5 kg  Estimated Creatinine Clearance: 80.7 mL/min (A) (by C-G formula based on SCr of 1.76 mg/dL (H)).  Assessment: Patient is a 51 y/o M with medical history including obesity, chronic back pain who presented to the ED 5/16 with weakness in setting of abdominal cramps, nausea, vomiting, and diarrhea. Intubated with hypercapnic respiratory failure. Intubated with hypercapnic respiratory failure. New concern for obstructive shock from potential PE. Decline in clinical status with tachycardia, hypotension, and FiO2 requirements of 80-90%.   Date Time  HL Rate/Comment  5/20 0338 0.75  Supratherapeutic; 2000 > 1800 un/hr 5/20 1406 0.70 Therapeutic at ULN; 1800 > 1750 un/hr 5/20 2130 0.58 Therapeutic x 2  5/21 0332 0.56 Therapeutic x 3 5/22 0316 0.55 Therapeutic x 4    Goal of Therapy:  Heparin level 0.3-0.7 units/ml Monitor platelets by anticoagulation protocol: Yes   Plan:  Heparin level is therapeutic x 3 Continue heparin infusion at 1750 units/hr.  Recheck heparin daily with AM labs CBC daily while on heparin.   Renda Rolls, PharmD, [facility] 06/26/2021 5:14 AM

## 2021-06-26 NOTE — IPAL (Signed)
  Interdisciplinary Goals of Care Family Meeting   Date carried out: 06/26/2021  Location of the meeting: Bedside  Member's involved: Physician, Bedside Registered Nurse, and Family Member or next of kin      GOALS OF CARE DISCUSSION  The Clinical status was relayed to family in detail-  Updated and notified of patients medical condition- Patient remains unresponsive and will not open eyes to command.   Patient is having a weak cough and struggling to remove secretions.   Patient with increased WOB and using accessory muscles to breathe Explained to family course of therapy and the modalities   Patient with Progressive multiorgan failure with a very high probablity of a very minimal chance of meaningful recovery despite all aggressive and optimal medical therapy.    Family understands the situation.  The wife has consented and agreed to DNR status Patient would NOT want artifical support on a long term basis, he would NOT want HD on a long term basis, and he would NOT want TRACH/PEG tube  Family are satisfied with Plan of action and management. All questions answered  Additional CC time 30 mins   Ahniyah Giancola Patricia Pesa, M.D.  [facility]  Medical Director [place] Director [facility] Cardio-Pulmonary Department

## 2021-06-26 NOTE — Consult Note (Signed)
[place] for Electrolyte Monitoring and Replacement   Recent Labs: Potassium (mmol/L)  Date Value  06/26/2021 3.2 (L)   Magnesium (mg/dL)  Date Value  06/26/2021 2.1   Calcium (mg/dL)  Date Value  06/26/2021 9.3   Albumin (g/dL)  Date Value  06/26/2021 2.1 (L)   Phosphorus (mg/dL)  Date Value  06/26/2021 2.6   Sodium (mmol/L)  Date Value  06/26/2021 137   Assessment: Patient is a 51 y/o M with medical history including obesity, chronic back pain, EtOH use disorder who presented to the ED 5/16 with weakness in setting of abdominal cramps, nausea, vomiting, and diarrhea. Patient subsequently admitted for severe symptomatic hyponatremia. Patient decompensated on 5/18 with acute onset respiratory failure requiring intubation. Patient remains admitted to the ICU where he is intubated, sedated, and on mechanical ventilation. There is further concern for septic shock and patient is requiring multiple vasopressor infusions. Admission further complicated by acute renal failure necessitating initiation of CRRT on 5/19. Pharmacy consulted to assist with electrolyte monitoring and replacement as indicated.  Nutrition: Tube feeds initiated 5/22 Cathartics: Lactulose 30 g TID for hepatic encephalopathy  Goal of Therapy:  Electrolytes within normal limits  Plan:  --Potassium 3.2, will order potassium chloride 40 mEq x 1, pt is also on Phos-Nak 1 packet TIDHS for now given on-going CRRT and initiation of tube feeds today (risk for re-feeding) --Renal function panels ordered BID while on CRRT  Darnelle Bos, PharmD 06/26/2021 5:54 PM

## 2021-06-26 NOTE — Progress Notes (Signed)
Nutrition Follow-up  DOCUMENTATION CODES:   Obesity unspecified  INTERVENTION:   Initiate Vital AF 1.2 @ 20 ml/hr via OGT and increase by 10 ml every 4 hours to goal rate of 60 ml/hr.   90 ml Prosource TF QID.    30 ml free water flush every 4 hours to maintain tube patency  Tube feeding regimen provides 2048 kcal (100% of needs), 196 grams of protein, and 1170 ml of H2O.  Total free water: 1350 ml daily  NUTRITION DIAGNOSIS:   Inadequate oral intake related to inability to eat (pt sedated and ventilated) as evidenced by NPO status.  Ongoing  GOAL:   Provide needs based on ASPEN/SCCM guidelines  Progressing   MONITOR:   Vent status, Labs, Weight trends, TF tolerance, Skin, I & O's  REASON FOR ASSESSMENT:   Ventilator    ASSESSMENT:   51 y/o male with h/o etoh abuse, OSA and DDD who is admitted with alcohol withdrawal, encephalopathy, AKI and aspiration requiring intubation.  5/18- intubated 5/19- CRRT initiated, TF held secondary to pressor requirements  Patient is currently intubated on ventilator support. OGT placed on 06/22/21; placement verified by x-ray.  MV: 15.6 L/min Temp (24hrs), Avg:97.9 F (36.6 C), Min:95 F (35 C), Max:100.3 F (37.9 C)  MAP: 77  Reviewed I/O's: -163 ml x 24 hours and +1.4 L since admission  UOP: 83 ml x 24 hours  Rectal tube output: 600 ml x 24 hours  Case discussed with RN, MD, and during ICU rounds. Pt has been off sedation for 2 hours; he intact cough, but not gag or purposeful movement. He is making minimal urine (less than 60 ml this shift). Pt tolerating CRRT without issues.   Plan to resume TF today.   Pt wife does not desire trach, PEG, or long term HD. Plan to continue to watch for outcomes and address goals of care accordingly.   Medications reviewed and include folic acid, lactulose, senokot, thiamine, and levophed.   Labs reviewed: Phos: 2.4, CBGS: 117-133 (inpatient orders for glycemic control are none).     Diet Order:   Diet Order             Diet NPO time specified  Diet effective now                   EDUCATION NEEDS:   No education needs have been identified at this time  Skin:  Skin Assessment: Skin Integrity Issues: Skin Integrity Issues:: DTI DTI: lt buttocks  Last BM:  06/26/21 (via rectal tube)  Height:   Ht Readings from Last 1 Encounters:  06/29/2021 6\' 3"  (1.905 m)    Weight:   Wt Readings from Last 1 Encounters:  06/26/21 (!) 151.3 kg    Ideal Body Weight:  89 kg  BMI:  Body mass index is 41.69 kg/m.  Estimated Nutritional Needs:   Kcal:  0258-5277  Protein:  175-220 grams  Fluid:  > 1.6 L    Loistine Chance, RD, LDN, [place] Registered Dietitian II Certified Diabetes Care and Education Specialist Please refer to [facility] for RD and/or RD on-call/weekend/after hours pager

## 2021-06-26 NOTE — TOC Initial Note (Signed)
Transition of Care [facility]) - Initial/Assessment Note    Patient Details  Name: Robert Coffey MRN: [id number] Date of Birth: Oct 06, 1970  [facility] CM/SW Contact:    Shelbie Hutching, RN Phone Number: 06/26/2021, 1:43 PM  Clinical Narrative:                 Patient admitted to the hospital with acute metabolic encephalopathy, hyponatremia, and AKI.   Patient is currently in the ICU on a ventilator, unresponsive.  MD had meeting with family today and they have made him a DNR.  TOC will follow along hospital course.  Expected Discharge Plan:  (TBD) Barriers to Discharge: Continued Medical Work up   Patient Goals and CMS Choice Patient states their goals for this hospitalization and ongoing recovery are:: wife had made patient a DNR      Expected Discharge Plan and Services Expected Discharge Plan:  (TBD)       Living arrangements for the past 2 months: Single Family Home                 DME Arranged: N/A DME Agency: NA       HH Arranged: NA [place] Agency: NA        Prior Living Arrangements/Services Living arrangements for the past 2 months: [place] Lives with:: Spouse Patient language and need for interpreter reviewed:: Yes        Need for Family Participation in Patient Care: Yes (Comment) Care giver support system in place?: Yes (comment)   Criminal Activity/Legal Involvement Pertinent to Current Situation/Hospitalization: No - Comment as needed  Activities of Daily Living      Permission Sought/Granted                  Emotional Assessment   Attitude/Demeanor/Rapport: Intubated (Following Commands or Not Following Commands) Affect (typically observed): Unable to Assess   Alcohol / Substance Use: Not Applicable Psych Involvement: No (comment)  Admission diagnosis:  Hyponatremia [E87.1] Elevated liver function tests [R79.89] Generalized weakness [R53.1] AKI (acute kidney injury) ([place]) [D63.8] Acute metabolic encephalopathy  [V56.43] Patient Active Problem List   Diagnosis Date Noted   Acute metabolic encephalopathy 32/95/1884   Acute kidney injury ([place]) 07/03/2021   Hyponatremia 06/30/2021   Alcohol withdrawal ([place]) 07/05/2021   Transaminitis 06/30/2021   PCP:  Renee Rival, NP Pharmacy:   [place], [place] - [address] [place] [address] Phone: [phone] Fax: [phone]     Social Determinants of Health (SDOH) Interventions    Readmission Risk Interventions     View : No data to display.

## 2021-06-27 DIAGNOSIS — G9341 Metabolic encephalopathy: Secondary | ICD-10-CM | POA: Diagnosis not present

## 2021-06-27 LAB — CBC
HCT: 41.6 % (ref 39.0–52.0)
Hemoglobin: 14 g/dL (ref 13.0–17.0)
MCH: 37.2 pg — ABNORMAL HIGH (ref 26.0–34.0)
MCHC: 33.7 g/dL (ref 30.0–36.0)
MCV: 110.6 fL — ABNORMAL HIGH (ref 80.0–100.0)
Platelets: 87 10*3/uL — ABNORMAL LOW (ref 150–400)
RBC: 3.76 MIL/uL — ABNORMAL LOW (ref 4.22–5.81)
RDW: 18.5 % — ABNORMAL HIGH (ref 11.5–15.5)
WBC: 14.1 10*3/uL — ABNORMAL HIGH (ref 4.0–10.5)
nRBC: 4.9 % — ABNORMAL HIGH (ref 0.0–0.2)

## 2021-06-27 LAB — RENAL FUNCTION PANEL
Albumin: 2 g/dL — ABNORMAL LOW (ref 3.5–5.0)
Albumin: 1.8 g/dL — ABNORMAL LOW (ref 3.5–5.0)
Anion gap: 9 (ref 5–15)
Anion gap: 11 (ref 5–15)
BUN: 34 mg/dL — ABNORMAL HIGH (ref 6–20)
BUN: 38 mg/dL — ABNORMAL HIGH (ref 6–20)
CO2: 22 mmol/L (ref 22–32)
CO2: 20 mmol/L — ABNORMAL LOW (ref 22–32)
Calcium: 8.4 mg/dL — ABNORMAL LOW (ref 8.9–10.3)
Calcium: 7.5 mg/dL — ABNORMAL LOW (ref 8.9–10.3)
Chloride: 105 mmol/L (ref 98–111)
Chloride: 108 mmol/L (ref 98–111)
Creatinine, Ser: 1.93 mg/dL — ABNORMAL HIGH (ref 0.61–1.24)
Creatinine, Ser: 2.15 mg/dL — ABNORMAL HIGH (ref 0.61–1.24)
GFR, Estimated: 42 mL/min — ABNORMAL LOW (ref >60)
GFR, Estimated: 37 mL/min — ABNORMAL LOW (ref >60)
Glucose, Bld: 126 mg/dL — ABNORMAL HIGH (ref 70–99)
Glucose, Bld: 143 mg/dL — ABNORMAL HIGH (ref 70–99)
Phosphorus: 1.7 mg/dL — ABNORMAL LOW (ref 2.5–4.6)
Phosphorus: 1.5 mg/dL — ABNORMAL LOW (ref 2.5–4.6)
Potassium: 3.6 mmol/L (ref 3.5–5.1)
Potassium: 3.3 mmol/L — ABNORMAL LOW (ref 3.5–5.1)
Sodium: 136 mmol/L (ref 135–145)
Sodium: 139 mmol/L (ref 135–145)

## 2021-06-27 LAB — GLUCOSE, CAPILLARY
Glucose-Capillary: 120 mg/dL — ABNORMAL HIGH (ref 70–99)
Glucose-Capillary: 137 mg/dL — ABNORMAL HIGH (ref 70–99)
Glucose-Capillary: 143 mg/dL — ABNORMAL HIGH (ref 70–99)
Glucose-Capillary: 172 mg/dL — ABNORMAL HIGH (ref 70–99)
Glucose-Capillary: 163 mg/dL — ABNORMAL HIGH (ref 70–99)
Glucose-Capillary: 129 mg/dL — ABNORMAL HIGH (ref 70–99)

## 2021-06-27 LAB — LACTIC ACID, PLASMA
Lactic Acid, Venous: 3.3 mmol/L (ref 0.5–1.9)
Lactic Acid, Venous: 3 mmol/L (ref 0.5–1.9)

## 2021-06-27 LAB — MAGNESIUM: Magnesium: 2.4 mg/dL (ref 1.7–2.4)

## 2021-06-27 LAB — AMMONIA: Ammonia: 59 umol/L — ABNORMAL HIGH (ref 9–35)

## 2021-06-27 MED ORDER — POTASSIUM & SODIUM PHOSPHATES 280-160-250 MG PO PACK
2.0000 | PACK | Freq: Three times a day (TID) | ORAL | Status: DC
  Administered 2021-06-27 – 2021-06-28 (×5): 2
  Filled 2021-06-27 (×3): qty 2
  Filled 2021-06-27: qty 1
  Filled 2021-06-27: qty 2

## 2021-06-27 MED ORDER — PUREFLOW DIALYSIS SOLUTION: INTRAVENOUS | Status: DC

## 2021-06-27 MED ORDER — PRISMASOL BGK 4/2.5 32-4-2.5 MEQ/L EC SOLN: Status: DC

## 2021-06-27 MED ORDER — PRISMASOL BGK 4/2.5 32-4-2.5 MEQ/L REPLACEMENT SOLN
Status: DC
  Filled 2021-06-27 (×13): qty 5000

## 2021-06-27 MED ORDER — POTASSIUM CHLORIDE 20 MEQ PO PACK
40.0000 meq | PACK | Freq: Once | ORAL | Status: AC
Start: 2021-06-27 — End: 2021-06-27
  Administered 2021-06-27: 40 meq
  Filled 2021-06-27: qty 2

## 2021-06-27 MED ORDER — LACTULOSE 10 GM/15ML PO SOLN
20.0000 g | Freq: Three times a day (TID) | ORAL | Status: DC
  Administered 2021-06-27 – 2021-07-05 (×25): 20 g
  Filled 2021-06-27 (×25): qty 30

## 2021-06-27 NOTE — Progress Notes (Signed)
Verbal order from Dr. Candiss Norse to change dialysis fluid to [facility]

## 2021-06-27 NOTE — Progress Notes (Signed)
[facility]  PROGRESS NOTE   Subjective:   Patient seen and evaluated in ICU.  Remains intubated and sedated.  Patient failed sedation weaning yesterday. Wife at bedside CRRT in place Pressors, Levo  Foley catheter-minimal output  Objective:  Vital signs: Blood pressure 120/73, pulse (!) 102, temperature 98.1 F (36.7 C), resp. rate (!) 22, height $RemoveBe'6\' 3"'mPKefYmnU$  (1.905 m), weight (!) 150.7 kg, SpO2 94 %.  Intake/Output Summary (Last 24 hours) at 06/27/2021 1111 Last data filed at 06/27/2021 1100 Gross per 24 hour  Intake 1635.69 ml  Output 3204 ml  Net -1568.31 ml    Filed Weights   06/25/21 0325 06/26/21 0500 06/27/21 0413  Weight: (!) 153.6 kg (!) 151.3 kg (!) 150.7 kg     Physical Exam: General:  No acute distress  Head:  Normocephalic, atraumatic.  Eyes: Jaundiced  Lungs:  Rhonchi throughout, normal effort  Heart:  S1S2 no rubs  Abdomen:   Soft, nontender, bowel sounds present  Extremities:  No peripheral edema.  Neurologic: Unresponsive on the vent.  Skin:  No lesions  Access: Rt IJ Temp cath    Basic Metabolic Panel: Recent Labs  Lab 06/24/21 0338 06/24/21 1406 06/25/21 0320 06/25/21 0332 06/25/21 1603 06/26/21 0316 06/26/21 1425 06/27/21 0357  NA 135   < >  --  135 137 137 137 136  K 4.3   < >  --  4.3 3.9 3.5 3.2* 3.6  CL 101   < >  --  104 104 104 104 105  CO2 22   < >  --  21* 22 21* 22 22  GLUCOSE 121*   < >  --  118* 121* 123* 125* 126*  BUN 26*   < >  --  25* 27* 28* 31* 34*  CREATININE 2.24*   < >  --  1.82* 1.77* 1.76* 1.93* 1.93*  CALCIUM 7.8*   < >  --  8.2* 9.0 9.2 9.3 8.4*  MG 2.4  --  2.3  --  2.2 2.2 2.1  --   PHOS 2.1*   < >  --  2.6 2.6 2.4* 2.6 1.7*   < > = values in this interval not displayed.     CBC: Recent Labs  Lab 06/23/21 0425 06/24/21 0338 06/25/21 0320 06/26/21 0316 06/27/21 0357  WBC 19.5* 17.4* 16.3* 15.1* 14.1*  HGB 13.9 13.9 14.0 13.9 14.0  HCT 42.3 42.0 42.1 40.5 41.6  MCV 116.5* 112.6* 114.1*  111.3* 110.6*  PLT 133* 117* 107* 85* 87*      Urinalysis: No results for input(s): COLORURINE, LABSPEC, PHURINE, GLUCOSEU, HGBUR, BILIRUBINUR, KETONESUR, PROTEINUR, UROBILINOGEN, NITRITE, LEUKOCYTESUR in the last 72 hours.  Invalid input(s): APPERANCEUR    Imaging: CT HEAD WO CONTRAST (5MM)  Result Date: 06/26/2021 CLINICAL DATA:  Mental status change.  Altered mental status. EXAM: CT HEAD WITHOUT CONTRAST TECHNIQUE: Contiguous axial images were obtained from the base of the skull through the vertex without intravenous contrast. RADIATION DOSE REDUCTION: This exam was performed according to the departmental dose-optimization program which includes automated exposure control, adjustment of the mA and/or kV according to patient size and/or use of iterative reconstruction technique. COMPARISON:  Head CT 06/18/2021 FINDINGS: Brain: No acute intracranial hemorrhage. No focal mass lesion. No CT evidence of acute infarction. No midline shift or mass effect. No hydrocephalus. Basilar cisterns are patent. Vascular: No hyperdense vessel or unexpected calcification. Skull: Normal. Negative for fracture or focal lesion. Sinuses/Orbits: Mucosal thickening in the maxillary sinuses. Orbits  are normal. Other: None. IMPRESSION: 1. No acute intracranial findings. 2. No change from CT 06/25/2021. Electronically Signed   By: Suzy Bouchard M.D.   On: 06/26/2021 12:12     Medications:     prismasol BGK 4/2.5 500 mL/hr at 06/27/21 0928    prismasol BGK 4/2.5 500 mL/hr at 06/27/21 7408   sodium chloride 10 mL/hr at 06/27/21 1100   sodium chloride Stopped (06/26/21 1129)   ampicillin-sulbactam (UNASYN) IV Stopped (06/27/21 0956)   dexmedetomidine (PRECEDEX) IV infusion Stopped (06/26/21 1909)   feeding supplement (VITAL AF 1.2 CAL) 50 mL/hr at 06/27/21 0400   fentaNYL infusion INTRAVENOUS Stopped (06/26/21 0819)   norepinephrine (LEVOPHED) Adult infusion Stopped (06/27/21 1005)   prismasol BGK 4/2.5 2,500  mL/hr at 06/27/21 1448   sodium chloride 0.9 % 100 mL with hydrocortisone sodium succinate (SOLU-CORTEF) 100 mg infusion 8.3 mL/hr at 06/27/21 1100   vasopressin Stopped (06/26/21 0331)    chlorhexidine gluconate (MEDLINE KIT)  15 mL Mouth Rinse BID   Chlorhexidine Gluconate Cloth  6 each Topical Q0600   feeding supplement (PROSource TF)  90 mL Per Tube QID   folic acid  1 mg Intravenous Daily   free water  30 mL Per Tube Q4H   heparin injection (subcutaneous)  5,000 Units Subcutaneous Q12H   lactulose  20 g Per Tube TID   mouth rinse  15 mL Mouth Rinse 10 times per day   midodrine  10 mg Per Tube TID WC   multivitamin with minerals  1 tablet Per Tube Daily   pantoprazole sodium  40 mg Per Tube Daily   potassium & sodium phosphates  1 packet Per Tube TID WC & HS   rifaximin  550 mg Per Tube BID   thiamine injection  100 mg Intravenous Q24H    Assessment/ Plan:     Principal Problem:   Acute metabolic encephalopathy Active Problems:   Acute kidney injury ([place])   Hyponatremia   Alcohol withdrawal ([place])   Transaminitis   51 y.o.  male with past medical history including alcohol abuse, chronic back pain, and peripheral neuropathy, who was admitted to [facility] on 06/12/2021 for Hyponatremia, Elevated liver function tests. Generalized weakness, AKI (acute kidney injury) and Acute metabolic encephalopathy.    #1: Acute kidney injury due to ETOH abuse, poor nutrition and severe illness.  Due to sustained decreased urine output, we will continue CRRT at this time.  We will continue to monitor closely   #2: Hypotension/shock: Remains on Levophed   #3: Hyponatremia: Corrected    #4: Acute respiratory failure with possible pneumonia: Continue antibiotics.  Sedation and vent weaning failed yesterday.  #5: Transaminitis secondary to liver failure/shock liver: Most likely secondary to EtOH abuse.  Continue supportive care   LOS: [place] [facility] 5/23/202311:11 AM

## 2021-06-27 NOTE — IPAL (Signed)
  Interdisciplinary Goals of Care Family Meeting   Date carried out: 06/27/2021  Location of the meeting: Bedside  Member's involved: Physician and Family Member or next of kin    GOALS OF CARE DISCUSSION  The Clinical status was relayed to family in detail-WIFE AT BEDSIDE  Updated and notified of patients medical condition- Patient remains unresponsive and will not open eyes to command.   Patient is having a weak cough and struggling to remove secretions.   Patient with increased WOB and using accessory muscles to breathe Explained to family course of therapy and the modalities   Patient with Progressive multiorgan failure with a very high probablity of a very minimal chance of meaningful recovery despite all aggressive and optimal medical therapy.   Family understands the situation.  They have consented and agreed to DNR  Family are satisfied with Plan of action and management. All questions answered  Additional CC time 20 mins   Jeyla Bulger Patricia Pesa, M.D.  [facility]  Medical Director [place] Director [facility] Cardio-Pulmonary Department

## 2021-06-27 NOTE — Progress Notes (Signed)
NAME:  Robert Coffey, MRN:  [id number], DOB:  [birth year], LOS: 7 ADMISSION DATE:  06/22/2021, CONSULTATION DATE:  06/22/2021 REFERRING MD:  Dr. Jimmye Norman, CHIEF COMPLAINT:  Acute Respiratory Distress, AMS   Brief Pt Description / Synopsis:  51 year old male admitted with acute metabolic encephalopathy secondary to alcohol withdrawal and severe hyponatremia requiring hypertonic saline.  On 5/18 developed acute hypoxic & hypercapnic respiratory failure requiring intubation and mechanical ventilation.  History of Present Illness:  Robert Coffey is a 51 year old male with a past medical history significant for alcohol abuse, obesity, chronic back pain, peripheral neuropathy who presented to [facility] ED on 06/26/2021 due to altered mental status, poor p.o. intake, progressive weakness with difficulty getting around at home.  Patient is now intubated and unable to contribute to history, therefore history is obtained from patient's wife at bedside and chart review.  Per the patient's wife, the patient has not been feeling well with poor p.o. intake for approximately a month, however has continued to drink alcohol daily (usually 3-4 drinks of liquor) during that time.  Over the past several days he has become progressively weak with difficulty getting around at home (not even in bed able to get out of bed), intermittent confusion, and diffuse abdominal cramping/nausea/vomiting/diarrhea.  Denies any history of falls, chest pain, shortness of breath, fever, cough.  Last known alcoholic drink was the day prior to admission on Sunday.  ED Course: Initial Vital Signs: Temperature 98.1 F orally, blood pressure 136/119, respiratory rate 18, pulse 106, SPO2 95% Significant Labs: Sodium 109, chloride 69, glucose 108, BUN 8, creatinine 1.62, anion gap 17, albumin 3.1, lipase 86, AST 256, ALT 119, total bilirubin 5.0, high-sensitivity troponin 67, serum osmolality 236, urine osmolality 437, urine sodium less than 10 WBC  11.0, platelets 129, TSH 2.1 COVID-19 and influenza PCR negative Urinalysis negative for UTI Urine drug screen positive for tricyclics Ethyl alcohol less than 10 Imaging CT head without contrast>>IMPRESSION: 1. Fluid or debris in the left sinus tympani/middle ear, cannot exclude otitis media. There small bilateral mastoid effusions and chronic bilateral maxillary sinusitis. 2. Advanced for age cerebral atrophy. 3. Atherosclerosis. CT abdomen and pelvis>>IMPRESSION: Hepatic steatosis. 2.6 cm enhancing left adrenal nodule is noted. When the patient is clinically stable and able to follow directions and hold their breath (preferably as an outpatient) further evaluation with dedicated abdominal MRI should be considered. Medications Administered: 1 L normal saline bolus, hypertonic saline infusion  He was admitted by the hospitalist for further work-up and treatment of acute metabolic encephalopathy in the setting of DTs and severe hyponatremia.  Please see "significant hospital events" section below for full detailed hospital course.  Pertinent  Medical History  Alcohol abuse Obesity Chronic back  Micro Data:  5/16: SARS-CoV-2 and influenza PCR>> negative 5/16: HIV screen>> nonreactive 5/16: MRSA PCR>> negative 5/18: Tracheal aspirate>>normal flora 5/19: Blood x2>>NGTD  Antimicrobials:  5/18: Unasyn>>x2 doses; restarted 5/23>> 5/18: Cefepime>>5/21 5/18: Vancomycin>>5/21 5/18: Flagyl>>5/21 Zosyn 5/21>>5/22  Significant Hospital Events: Including procedures, antibiotic start and stop dates in addition to other pertinent events   5/16: Admitted by hospitalist for acute metabolic encephalopathy in setting of severe hyponatremia and DTs.  Requiring hypertonic saline 5/18: Developed acute hypoxic respiratory failure, possible concern for aspiration.  Required intubation and mechanical ventilation. 5/19: Pt with worsening acute renal failure with severe metabolic and lactic acidosis  requiring CRRT.  Severely hypotensive requiring epinephrine, levophed, and vasopressin gtts to maintain map >65  5/20: Remains critically ill, on CRRT. Vent support slightly weaned to  60% FiO2 & 10 PEEP. Some improvement in vasopressor requirements (off of epinephrine and phenylephrine) 5/21: Overnight weaned off of Giapreza, currently only on norepinephrine and vasopressin 5/22: No acute events overnight on minimal ventilator settings FiO2 30% PEEP 8.  Remains on levophed gtt to maintain map >65.  Will perform WUA  5/23:  Continues to require low dose levophed, remains on CRRT.  On minimal vent support, Mental status precluding extubation.  Sedation being held  Interim History / Subjective:  -No acute events noted overnight -Afebrile, requiring 8 mcg Levophed -Remains on CRRT due to low UOP -On minimal vent support  ~ mental status currently precluding extubation -Sedation is off  -CTH yesterday negative -Ammonia trending down to 59 (85) -Platelets remain stable at 87 (85)  Objective   Blood pressure 120/73, pulse 93, temperature 98.6 F (37 C), resp. rate 20, height $RemoveBe'6\' 3"'TFTKVmMBv$  (1.905 m), weight (!) 150.7 kg, SpO2 95 %.    Vent Mode: PRVC FiO2 (%):  [24 %-30 %] 24 % Set Rate:  [22 bmp] 22 bmp Vt Set:  [650 mL] 650 mL PEEP:  [5 cmH20-8 cmH20] 5 cmH20 Plateau Pressure:  [16 cmH20] 16 cmH20   Intake/Output Summary (Last 24 hours) at 06/27/2021 9604 Last data filed at 06/27/2021 0700 Gross per 24 hour  Intake 1768.95 ml  Output 2841 ml  Net -1072.05 ml    Filed Weights   06/25/21 0325 06/26/21 0500 06/27/21 0413  Weight: (!) 153.6 kg (!) 151.3 kg (!) 150.7 kg   Examination: General: Acutely ill-appearing obese male, laying in bed, NAD, on no sedation, mechanically intubated  HENT: Atraumatic, normocephalic, neck supple, difficult to assess JVD due to body habitus Lungs: Faint rhonchi throughout, even, non labored  Cardiovascular: NSR, rrr no murmurs, rubs, gallops, 1+ generalized  edema  Abdomen: +BS x4, obese, soft, non distended  Extremities: Normal bulk and tone Neuro: Sedation off, not following commands, PERRL  GU: Indwelling foley catheter draining yellow urine   Imaging     Assessment & Plan:   Acute hypoxic & hypercapnic respiratory failure in the setting of suspected aspiration in the setting of severe DTs Pulmonary Embolism - cannot r/o -Full vent support, on lung protective strategies -Plateau pressures less than 30 cm H20 -Wean FiO2 & PEEP as tolerated to maintain O2 sats >92% -Follow intermittent Chest X-ray & ABG as needed -Spontaneous Breathing Trials when respiratory parameters met and mental status permits -Continue VAP Bundle -PRN Bronchodilators -Continue abx as outlined above  -Treating empirically with heparin gtt for possible PE given hx of poor mobility prior to admission (unable to obtain CTA Chest due to AKI and hemodynamic instability) ~ Heparin d/c 5/22 due to thrombocytopenia -Venous US Bilateral LE negative for DVT  Septic shock~improving   Elevated troponin suspect secondary to demand ischemia -Continuous cardiac monitoring -Maintain MAP >65 -Vasopressors as needed to maintain MAP goal -Stress dose steroids -Continue Midodrine -Trend lactic acid until normalized -HS Troponin peaked at 67 -Echocardiogram 06/23/21: LVEF 60-65%, Grade I DD, normal RV systolic function  Severe Sepsis due to Suspected aspiration CT head 5/16 unable to exclude left otitis media and chronic bilateral maxillary sinusitis  Respiratory culture 05/18>>few normal respiratory flora-no staph aureus or   pseudomonas seen  -Monitor fever curve -Trend WBC's & Procalcitonin -Follow cultures as above -Continue Unasyn  Hypotonic hyponatremia, suspect secondary to dehydration & poor p.o. intake~RESOLVED Acute kidney injury secondary to ATN with severe metabolic acidosis  Lactic acidosis persistent likely due to hepatic impairment -Trend BMP,  lactic acid,  and ABG -Persistent lactic acid elevation likely due to hepatic dysfunction -Replace electrolytes as indicated  -Monitor UOP -Nephrology following, appreciate input: continue CRRT   Transaminitis secondary to hepatic steatosis & ETOH abuse & Shock liver -Trend LFTs and coags -AFP tumor marker normal  Thrombocytopenia, suspect secondary to alcohol abuse -Trend CBC -Monitor for s/sx of bleeding and transfuse for Hgb <7 -Transfuse platelets for platelet count less than 50 with active bleeding  Incidental finding of left adrenal nodule via CT Abd/Pelvis  -Will need abdominal MRI for further evaluation in the outpatient setting   Acute metabolic encephalopathy in the setting of severe DTs and severe hyponatremia Sedation needs in the setting of mechanical ventilation -Maintain a RASS goal of 0 to -1 -Fentanyl and versed as needed to maintain RASS goal -Avoid sedating medications as able -Daily wake up assessment -Continue thiamine  -Continue folic acid and multivitamin -Continue CIWA protocol -Continue lactulose and trend ammonia level  -Repeat CT Head 5/22 negative ~ may need to consider MRI Brain  Morbid obesity with sarcopenia Protein malnutrition -Calorie excess, protein deficiency in the setting of alcoholism -Dietitian consulted for initiation of TF's  Best Practice (right click and "Reselect all SmartList Selections" daily)  Diet/type: NPO, tube feeds DVT prophylaxis: Heparin SQ  GI prophylaxis: PPI Lines: Central line, and is still needed Foley:  Yes, and it is still needed Code Status: DNR Last date of multidisciplinary goals of care discussion [06/27/21]  Updated pts wife at bedside regarding pts condition and guarded overall prognosis.  Daily updates will be continued.  Palliative Care consulted  Labs   CBC: Recent Labs  Lab 06/23/21 0425 06/24/21 0338 06/25/21 0320 06/26/21 0316 06/27/21 0357  WBC 19.5* 17.4* 16.3* 15.1* 14.1*  HGB 13.9 13.9 14.0 13.9  14.0  HCT 42.3 42.0 42.1 40.5 41.6  MCV 116.5* 112.6* 114.1* 111.3* 110.6*  PLT 133* 117* 107* 85* 87*     Basic Metabolic Panel: Recent Labs  Lab 06/24/21 0338 06/24/21 1406 06/25/21 0320 06/25/21 0332 06/25/21 1603 06/26/21 0316 06/26/21 1425 06/27/21 0357  NA 135   < >  --  135 137 137 137 136  K 4.3   < >  --  4.3 3.9 3.5 3.2* 3.6  CL 101   < >  --  104 104 104 104 105  CO2 22   < >  --  21* 22 21* 22 22  GLUCOSE 121*   < >  --  118* 121* 123* 125* 126*  BUN 26*   < >  --  25* 27* 28* 31* 34*  CREATININE 2.24*   < >  --  1.82* 1.77* 1.76* 1.93* 1.93*  CALCIUM 7.8*   < >  --  8.2* 9.0 9.2 9.3 8.4*  MG 2.4  --  2.3  --  2.2 2.2 2.1  --   PHOS 2.1*   < >  --  2.6 2.6 2.4* 2.6 1.7*   < > = values in this interval not displayed.    GFR: Estimated Creatinine Clearance: 73.5 mL/min (A) (by C-G formula based on SCr of 1.93 mg/dL (H)). Recent Labs  Lab 06/23/21 0717 06/23/21 1140 06/23/21 1815 06/24/21 0338 06/24/21 0339 06/25/21 0320 06/25/21 0332 06/26/21 0316 06/27/21 0357  PROCALCITON 16.69  --   --  7.12  --  5.62  --   --   --   WBC  --   --   --  17.4*  --  16.3*  --  15.1* 14.1*  LATICACIDVEN  --    < > 5.5*  --  4.1*  --  4.1* 3.3*  --    < > = values in this interval not displayed.     Liver Function Tests: Recent Labs  Lab 06/23/21 0425 06/23/21 1519 06/24/21 0339 06/24/21 1406 06/25/21 0320 06/25/21 0332 06/25/21 1603 06/26/21 0316 06/26/21 0332 06/26/21 1425 06/27/21 0357  AST 752*  --  1,148*  --  1,000*  --   --   --  665*  --   --   ALT 175*  --  280*  --  289*  --   --   --  251*  --   --   ALKPHOS 107  --  138*  --  125  --   --   --  110  --   --   BILITOT 6.3*  --  8.0*  --  7.9*  --   --   --  7.2*  --   --   PROT 5.9*  --  6.0*  --  5.8*  --   --   --  5.5*  --   --   ALBUMIN 2.5*   < > 2.4*   < > 2.3*   < > 2.2* 2.1* 2.1* 2.1* 2.0*   < > = values in this interval not displayed.    No results for input(s): LIPASE, AMYLASE in  the last 168 hours.  Recent Labs  Lab 06/22/21 1531 06/23/21 1140 06/25/21 0332 06/27/21 0357  AMMONIA 96* 40* 85* 59*     ABG    Component Value Date/Time   PHART 7.39 06/25/2021 0508   PCO2ART 36 06/25/2021 0508   PO2ART 99 06/25/2021 0508   HCO3 21.8 06/25/2021 0508   ACIDBASEDEF 2.6 (H) 06/25/2021 0508   O2SAT 99.5 06/25/2021 0508      Coagulation Profile: Recent Labs  Lab 06/22/21 2010 06/23/21 0425  INR 1.5* 2.0*     Cardiac Enzymes: No results for input(s): CKTOTAL, CKMB, CKMBINDEX, TROPONINI in the last 168 hours.  HbA1C: No results found for: HGBA1C  CBG: Recent Labs  Lab 06/26/21 0324 06/26/21 0740 06/26/21 1637 06/27/21 0353 06/27/21 0714  GLUCAP 117* 116* 99 120* 137*     Review of Systems:   Unable to assess due to intubation/sedation/critical illness  Allergies Allergies  Allergen Reactions   Sulfa Antibiotics      Home Medications  Prior to Admission medications   Medication Sig Start Date End Date Taking? Authorizing Provider  atorvastatin (LIPITOR) 20 MG tablet Take 20 mg by mouth daily. 02/03/21  Yes [provider]  cyanocobalamin 1000 MCG tablet Take by mouth.   Yes [provider]  hydrochlorothiazide (HYDRODIURIL) 25 MG tablet Take 25 mg by mouth daily. 05/30/21  Yes [provider]  lisinopril (ZESTRIL) 40 MG tablet Take 40 mg by mouth daily. 05/04/21  Yes [provider]  rOPINIRole (REQUIP) 0.25 MG tablet Take 0.25 mg by mouth 3 (three) times daily. 07/28/20  Yes [provider]  vitamin C (ASCORBIC ACID) 500 MG tablet Take 500 mg by mouth daily.   Yes [provider]  zolpidem (AMBIEN) 10 MG tablet Take 10 mg by mouth at bedtime as needed. 06/07/21  Yes [provider]     Scheduled Meds:  chlorhexidine gluconate (MEDLINE KIT)  15 mL Mouth Rinse BID   Chlorhexidine Gluconate Cloth  6 each Topical Q0600   feeding supplement (PROSource TF)  90  mL Per Tube QID    folic acid  1 mg Intravenous Daily   free water  30 mL Per Tube Q4H   heparin injection (subcutaneous)  5,000 Units Subcutaneous Q12H   lactulose  30 g Per Tube TID   mouth rinse  15 mL Mouth Rinse 10 times per day   midodrine  10 mg Per Tube TID WC   multivitamin with minerals  1 tablet Per Tube Daily   pantoprazole sodium  40 mg Per Tube Daily   potassium & sodium phosphates  1 packet Per Tube TID WC & HS   rifaximin  550 mg Per Tube BID   thiamine injection  100 mg Intravenous Q24H   Continuous Infusions:  sodium chloride 10 mL/hr at 06/27/21 0700   sodium chloride Stopped (06/26/21 1129)   ampicillin-sulbactam (UNASYN) IV Stopped (06/27/21 0056)   dexmedetomidine (PRECEDEX) IV infusion Stopped (06/26/21 1909)   feeding supplement (VITAL AF 1.2 CAL) 50 mL/hr at 06/27/21 0400   fentaNYL infusion INTRAVENOUS Stopped (06/26/21 0819)   norepinephrine (LEVOPHED) Adult infusion 8 mcg/min (06/27/21 0700)   prismasol BGK 2/2.5 dialysis solution 500 mL/hr at 06/26/21 1320   prismasol BGK 2/2.5 dialysis solution 500 mL/hr at 06/26/21 1320   prismasol BGK 2/2.5 dialysis solution 2,500 mL/hr at 06/27/21 0159   sodium chloride 0.9 % 100 mL with hydrocortisone sodium succinate (SOLU-CORTEF) 100 mg infusion 8.3 mL/hr at 06/27/21 0700   vasopressin Stopped (06/26/21 0331)   PRN Meds:.sodium chloride, acetaminophen **OR** acetaminophen, fentaNYL, heparin, ondansetron **OR** ondansetron (ZOFRAN) IV   Critical care time: 40 minutes    Darel Hong, AGACNP-BC [facility] Prefer epic messenger for cross cover needs If after hours, please call E-link

## 2021-06-27 NOTE — Consult Note (Signed)
[facility] for Electrolyte Monitoring and Replacement   Recent Labs: Potassium (mmol/L)  Date Value  06/27/2021 3.6   Magnesium (mg/dL)  Date Value  06/26/2021 2.1   Calcium (mg/dL)  Date Value  06/27/2021 8.4 (L)   Albumin (g/dL)  Date Value  06/27/2021 2.0 (L)   Phosphorus (mg/dL)  Date Value  06/27/2021 1.7 (L)   Sodium (mmol/L)  Date Value  06/27/2021 136   Assessment: Patient is a 51 y/o M with medical history including obesity, chronic back pain, EtOH use disorder who presented to the ED 5/16 with weakness in setting of abdominal cramps, nausea, vomiting, and diarrhea. Patient subsequently admitted for severe symptomatic hyponatremia. Patient decompensated on 5/18 with acute onset respiratory failure requiring intubation. Patient remains admitted to the ICU where he is intubated, sedated, and on mechanical ventilation. There is further concern for septic shock and patient is requiring multiple vasopressor infusions. Admission further complicated by acute renal failure necessitating initiation of CRRT on 5/19. Pharmacy consulted to assist with electrolyte monitoring and replacement as indicated.  Nutrition: Tube feeds initiated 5/22 Cathartics: Lactulose 30 g TID for hepatic encephalopathy  Goal of Therapy:  Electrolytes within normal limits  Plan:  --Phos 1.7, continue Phos-Nak 1 packet TIDHS given on-going CRRT and re-feeding with initiation of tube feeds --Renal function panels ordered BID while on CRRT  Benita Gutter 06/27/2021 8:10 AM

## 2021-06-27 NOTE — Consult Note (Signed)
[place] for Electrolyte Monitoring and Replacement   Recent Labs: Potassium (mmol/L)  Date Value  06/27/2021 3.3 (L)   Magnesium (mg/dL)  Date Value  06/26/2021 2.1   Calcium (mg/dL)  Date Value  06/27/2021 7.5 (L)   Albumin (g/dL)  Date Value  06/27/2021 1.8 (L)   Phosphorus (mg/dL)  Date Value  06/27/2021 1.5 (L)   Sodium (mmol/L)  Date Value  06/27/2021 139   Assessment: Patient is a 51 y/o M with medical history including obesity, chronic back pain, EtOH use disorder who presented to the ED 5/16 with weakness in setting of abdominal cramps, nausea, vomiting, and diarrhea. Patient subsequently admitted for severe symptomatic hyponatremia. Patient decompensated on 5/18 with acute onset respiratory failure requiring intubation. Patient remains admitted to the ICU where he is intubated, sedated, and on mechanical ventilation. There is further concern for septic shock and patient is requiring multiple vasopressor infusions. Admission further complicated by acute renal failure necessitating initiation of CRRT on 5/19. Pharmacy consulted to assist with electrolyte monitoring and replacement as indicated.  Nutrition: Tube feeds initiated 5/22 Cathartics: Lactulose 30 g TID for hepatic encephalopathy  Goal of Therapy:  Electrolytes within normal limits  Plan:  --K 3.3, repeat 40 mEq KCL pakcet per tube x 1  --Phos 1.7> 1.5, Increase Phos-Nak to 2 packets TIDHS given on-going CRRT and re-feeding with initiation of tube feeds --Renal function panels ordered BID while on CRRT  Dorothe Pea, PharmD, BCPS Clinical Pharmacist   06/27/2021 5:31 PM

## 2021-06-28 ENCOUNTER — Inpatient Hospital Stay: Payer: BC Managed Care – PPO

## 2021-06-28 DIAGNOSIS — G9341 Metabolic encephalopathy: Secondary | ICD-10-CM | POA: Diagnosis not present

## 2021-06-28 LAB — CBC
HCT: 40.8 % (ref 39.0–52.0)
Hemoglobin: 13.8 g/dL (ref 13.0–17.0)
MCH: 37.7 pg — ABNORMAL HIGH (ref 26.0–34.0)
MCHC: 33.8 g/dL (ref 30.0–36.0)
MCV: 111.5 fL — ABNORMAL HIGH (ref 80.0–100.0)
Platelets: 62 10*3/uL — ABNORMAL LOW (ref 150–400)
RBC: 3.66 MIL/uL — ABNORMAL LOW (ref 4.22–5.81)
RDW: 19.3 % — ABNORMAL HIGH (ref 11.5–15.5)
WBC: 12.6 10*3/uL — ABNORMAL HIGH (ref 4.0–10.5)
nRBC: 4 % — ABNORMAL HIGH (ref 0.0–0.2)

## 2021-06-28 LAB — RENAL FUNCTION PANEL
Albumin: 1.9 g/dL — ABNORMAL LOW (ref 3.5–5.0)
Albumin: 2.1 g/dL — ABNORMAL LOW (ref 3.5–5.0)
Anion gap: 10 (ref 5–15)
Anion gap: 9 (ref 5–15)
BUN: 41 mg/dL — ABNORMAL HIGH (ref 6–20)
BUN: 38 mg/dL — ABNORMAL HIGH (ref 6–20)
CO2: 22 mmol/L (ref 22–32)
CO2: 23 mmol/L (ref 22–32)
Calcium: 7.6 mg/dL — ABNORMAL LOW (ref 8.9–10.3)
Calcium: 7.7 mg/dL — ABNORMAL LOW (ref 8.9–10.3)
Chloride: 107 mmol/L (ref 98–111)
Chloride: 107 mmol/L (ref 98–111)
Creatinine, Ser: 2.17 mg/dL — ABNORMAL HIGH (ref 0.61–1.24)
Creatinine, Ser: 2.19 mg/dL — ABNORMAL HIGH (ref 0.61–1.24)
GFR, Estimated: 37 mL/min — ABNORMAL LOW (ref >60)
GFR, Estimated: 36 mL/min — ABNORMAL LOW (ref >60)
Glucose, Bld: 156 mg/dL — ABNORMAL HIGH (ref 70–99)
Glucose, Bld: 141 mg/dL — ABNORMAL HIGH (ref 70–99)
Phosphorus: 2 mg/dL — ABNORMAL LOW (ref 2.5–4.6)
Phosphorus: 2.9 mg/dL (ref 2.5–4.6)
Potassium: 3.9 mmol/L (ref 3.5–5.1)
Potassium: 4.2 mmol/L (ref 3.5–5.1)
Sodium: 139 mmol/L (ref 135–145)
Sodium: 139 mmol/L (ref 135–145)

## 2021-06-28 LAB — CULTURE, BLOOD (ROUTINE X 2)
Culture: NO GROWTH
Culture: NO GROWTH
Special Requests: ADEQUATE

## 2021-06-28 LAB — GLUCOSE, CAPILLARY
Glucose-Capillary: 133 mg/dL — ABNORMAL HIGH (ref 70–99)
Glucose-Capillary: 137 mg/dL — ABNORMAL HIGH (ref 70–99)
Glucose-Capillary: 137 mg/dL — ABNORMAL HIGH (ref 70–99)
Glucose-Capillary: 144 mg/dL — ABNORMAL HIGH (ref 70–99)
Glucose-Capillary: 147 mg/dL — ABNORMAL HIGH (ref 70–99)
Glucose-Capillary: 148 mg/dL — ABNORMAL HIGH (ref 70–99)
Glucose-Capillary: 150 mg/dL — ABNORMAL HIGH (ref 70–99)
Glucose-Capillary: 158 mg/dL — ABNORMAL HIGH (ref 70–99)

## 2021-06-28 LAB — MAGNESIUM: Magnesium: 2.6 mg/dL — ABNORMAL HIGH (ref 1.7–2.4)

## 2021-06-28 IMAGING — DX DG CHEST 1V PORT
1 series · 1 of 1 positions shown · non-contrast
Comparison: [DATE].

CLINICAL DATA: Acute respiratory failure with hypoxia.

EXAM:
PORTABLE CHEST 1 VIEW

[chest ap]
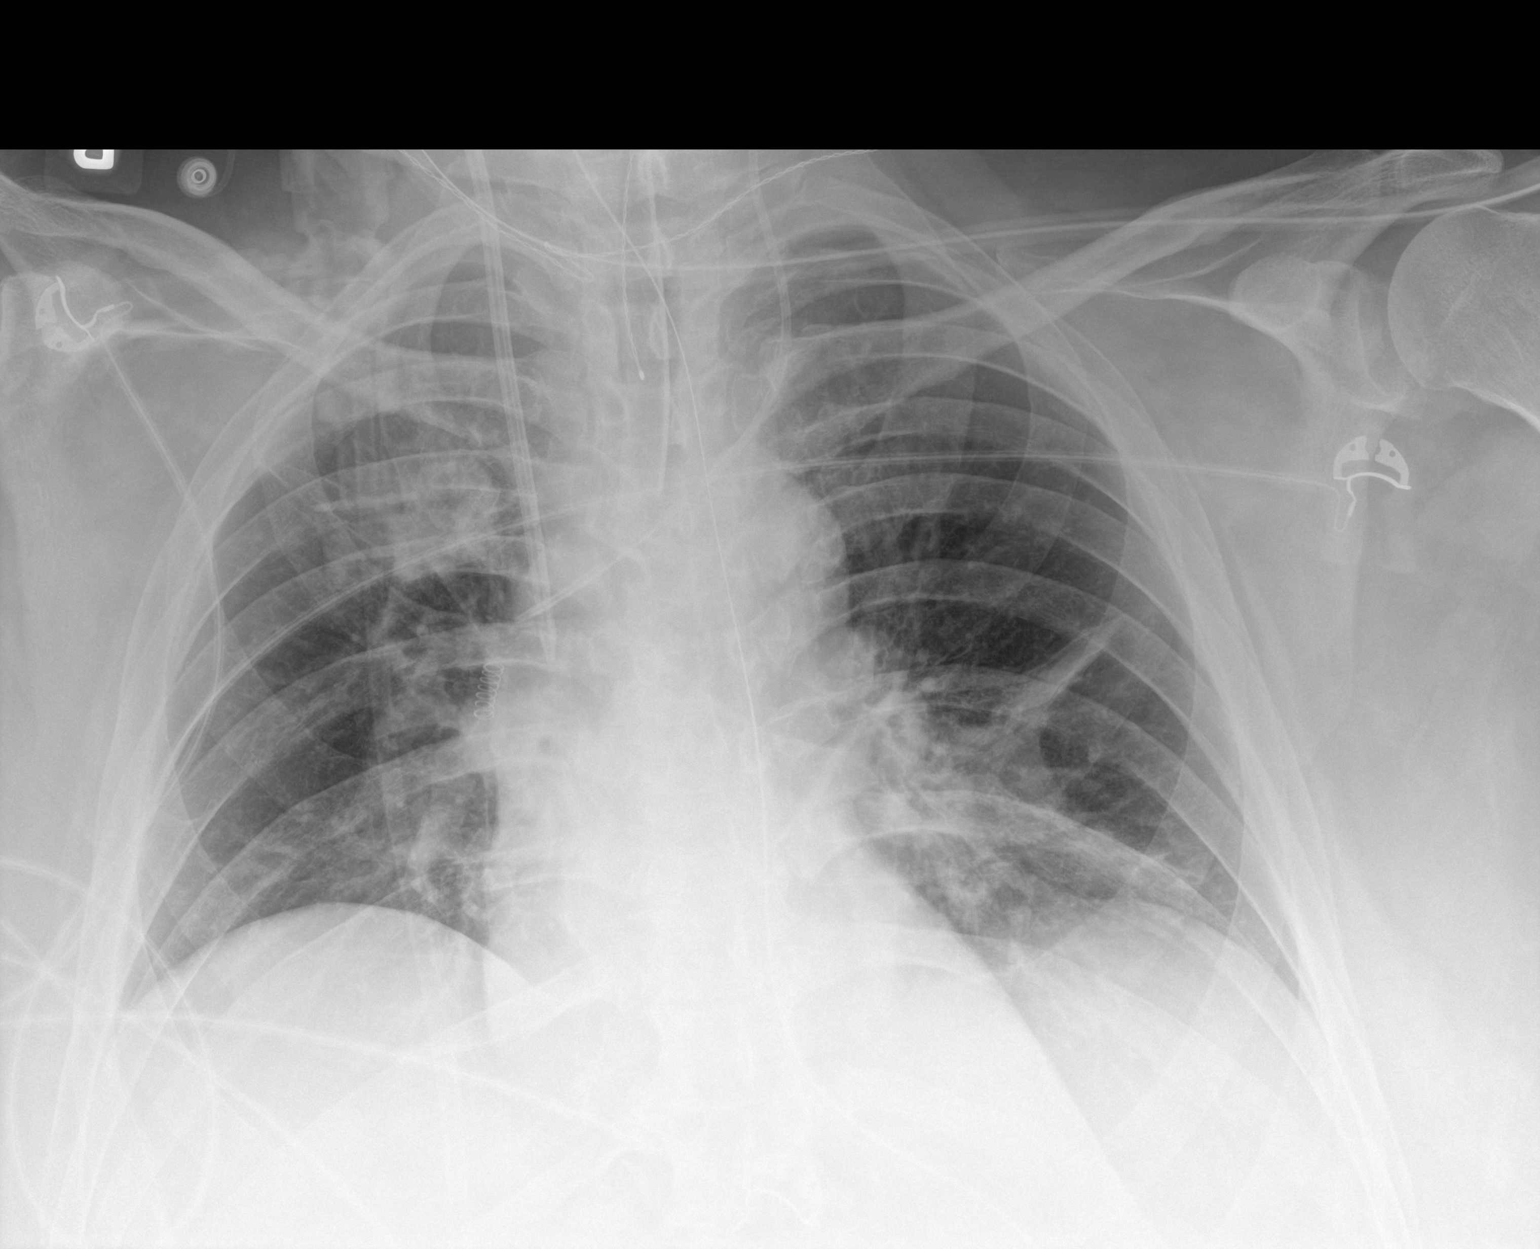

[1 of 1 positions shown; findings below may reference images not displayed]

FINDINGS: The heart size and mediastinal contours are within normal limits.
Endotracheal nasogastric tubes are unchanged in position. Bilateral
internal jugular catheters are unchanged. Stable bilateral lung
opacities are noted concerning for pneumonia. The visualized
skeletal structures are unremarkable.
IMPRESSION: Stable support apparatus. Stable bilateral lung opacities as
described above.

## 2021-06-28 MED ORDER — ARTIFICIAL TEARS OPHTHALMIC OINT
TOPICAL_OINTMENT | Freq: Three times a day (TID) | OPHTHALMIC | Status: DC
  Administered 2021-06-29 – 2021-07-03 (×4): 1 via OPHTHALMIC
  Administered 2021-07-04: 2 via OPHTHALMIC
  Filled 2021-06-28 (×3): qty 3.5

## 2021-06-28 NOTE — Progress Notes (Signed)
[facility]  PROGRESS NOTE   Subjective:   Patient remains critically ill Remains intubated without sedation.   Wife at bedside CRRT Levo currently held Tube feeding-60 mL/h Foley catheter-minimal output  Objective:  Vital signs: Blood pressure 117/77, pulse (!) 102, temperature (!) 97.2 F (36.2 C), temperature source Esophageal, resp. rate 19, height $RemoveBe'6\' 3"'abDbDYBxp$  (1.905 m), weight (!) 151 kg, SpO2 95 %.  Intake/Output Summary (Last 24 hours) at 06/28/2021 1036 Last data filed at 06/28/2021 1000 Gross per 24 hour  Intake 2136.77 ml  Output 2460 ml  Net -323.23 ml    Filed Weights   06/26/21 0500 06/27/21 0413 06/28/21 0331  Weight: (!) 151.3 kg (!) 150.7 kg (!) 151 kg     Physical Exam: General:  No acute distress  Head:  Normocephalic, atraumatic.  Eyes: Jaundiced  Lungs:  Rhonchi throughout, normal effort  Heart:  S1S2 no rubs  Abdomen:   Soft, nontender, bowel sounds present  Extremities:  No peripheral edema.  Neurologic: Unresponsive on the vent.  Skin:  No lesions  Access: Rt IJ Temp cath    Basic Metabolic Panel: Recent Labs  Lab 06/25/21 0320 06/25/21 0332 06/25/21 1603 06/26/21 0316 06/26/21 1425 06/27/21 0357 06/27/21 1631 06/28/21 0335  NA  --    < > 137 137 137 136 139 139  K  --    < > 3.9 3.5 3.2* 3.6 3.3* 3.9  CL  --    < > 104 104 104 105 108 107  CO2  --    < > 22 21* 22 22 20* 22  GLUCOSE  --    < > 121* 123* 125* 126* 143* 156*  BUN  --    < > 27* 28* 31* 34* 38* 41*  CREATININE  --    < > 1.77* 1.76* 1.93* 1.93* 2.15* 2.17*  CALCIUM  --    < > 9.0 9.2 9.3 8.4* 7.5* 7.6*  MG 2.3  --  2.2 2.2 2.1  --  2.4  --   PHOS  --    < > 2.6 2.4* 2.6 1.7* 1.5* 2.0*   < > = values in this interval not displayed.     CBC: Recent Labs  Lab 06/24/21 0338 06/25/21 0320 06/26/21 0316 06/27/21 0357 06/28/21 0335  WBC 17.4* 16.3* 15.1* 14.1* 12.6*  HGB 13.9 14.0 13.9 14.0 13.8  HCT 42.0 42.1 40.5 41.6 40.8  MCV 112.6* 114.1* 111.3*  110.6* 111.5*  PLT 117* 107* 85* 87* 62*      Urinalysis: No results for input(s): COLORURINE, LABSPEC, PHURINE, GLUCOSEU, HGBUR, BILIRUBINUR, KETONESUR, PROTEINUR, UROBILINOGEN, NITRITE, LEUKOCYTESUR in the last 72 hours.  Invalid input(s): APPERANCEUR    Imaging: CT HEAD WO CONTRAST (5MM)  Result Date: 06/26/2021 CLINICAL DATA:  Mental status change.  Altered mental status. EXAM: CT HEAD WITHOUT CONTRAST TECHNIQUE: Contiguous axial images were obtained from the base of the skull through the vertex without intravenous contrast. RADIATION DOSE REDUCTION: This exam was performed according to the departmental dose-optimization program which includes automated exposure control, adjustment of the mA and/or kV according to patient size and/or use of iterative reconstruction technique. COMPARISON:  Head CT 06/26/2021 FINDINGS: Brain: No acute intracranial hemorrhage. No focal mass lesion. No CT evidence of acute infarction. No midline shift or mass effect. No hydrocephalus. Basilar cisterns are patent. Vascular: No hyperdense vessel or unexpected calcification. Skull: Normal. Negative for fracture or focal lesion. Sinuses/Orbits: Mucosal thickening in the maxillary sinuses. Orbits are normal. Other:  None. IMPRESSION: 1. No acute intracranial findings. 2. No change from CT 06/28/2021. Electronically Signed   By: Suzy Bouchard M.D.   On: 06/26/2021 12:12   DG Chest Port 1 View  Result Date: 06/28/2021 CLINICAL DATA:  Acute respiratory failure with hypoxia. EXAM: PORTABLE CHEST 1 VIEW COMPARISON:  Jun 25, 2021. FINDINGS: The heart size and mediastinal contours are within normal limits. Endotracheal nasogastric tubes are unchanged in position. Bilateral internal jugular catheters are unchanged. Stable bilateral lung opacities are noted concerning for pneumonia. The visualized skeletal structures are unremarkable. IMPRESSION: Stable support apparatus. Stable bilateral lung opacities as described above.  Electronically Signed   By: Marijo Conception M.D.   On: 06/28/2021 07:51     Medications:     prismasol BGK 4/2.5 500 mL/hr at 06/27/21 2153    prismasol BGK 4/2.5 500 mL/hr at 06/27/21 2152   sodium chloride 10 mL/hr at 06/28/21 1000   sodium chloride Stopped (06/26/21 1129)   ampicillin-sulbactam (UNASYN) IV Stopped (06/28/21 0933)   dexmedetomidine (PRECEDEX) IV infusion Stopped (06/26/21 1909)   feeding supplement (VITAL AF 1.2 CAL) 60 mL/hr at 06/27/21 1200   fentaNYL infusion INTRAVENOUS Stopped (06/26/21 0819)   norepinephrine (LEVOPHED) Adult infusion Stopped (06/27/21 1005)   prismasol BGK 4/2.5 2,500 mL/hr at 06/28/21 0425    chlorhexidine gluconate (MEDLINE KIT)  15 mL Mouth Rinse BID   Chlorhexidine Gluconate Cloth  6 each Topical Q0600   feeding supplement (PROSource TF)  90 mL Per Tube QID   folic acid  1 mg Intravenous Daily   free water  30 mL Per Tube Q4H   heparin injection (subcutaneous)  5,000 Units Subcutaneous Q12H   lactulose  20 g Per Tube TID   mouth rinse  15 mL Mouth Rinse 10 times per day   midodrine  10 mg Per Tube TID WC   multivitamin with minerals  1 tablet Per Tube Daily   pantoprazole sodium  40 mg Per Tube Daily   potassium & sodium phosphates  2 packet Per Tube TID WC & HS   rifaximin  550 mg Per Tube BID   thiamine injection  100 mg Intravenous Q24H    Assessment/ Plan:     Principal Problem:   Acute metabolic encephalopathy Active Problems:   Acute kidney injury ([place])   Hyponatremia   Alcohol withdrawal ([place])   Transaminitis   51 y.o.  male with past medical history including alcohol abuse, chronic back pain, and peripheral neuropathy, who was admitted to [facility] on 06/23/2021 for Hyponatremia, Elevated liver function tests. Generalized weakness, AKI (acute kidney injury) and Acute metabolic encephalopathy.    #1: Acute kidney injury due to ETOH abuse, poor nutrition and severe illness.    Renal function improved in setting of CRRT.   Successfully weaned off sedation and pressors.  If remains stable, will discontinue CRRT tomorrow.  Due to minimal urine output, patient will transition to hemodialysis.  We will continue to monitor renal function.   #2: Hypotension/shock: Levophed held.  BP currently 117/77   #3: Hyponatremia: Corrected with hypertonic solution   #4: Acute respiratory failure with possible pneumonia: Remains intubated without sedation.  Antibiotic therapy in place.  #5: Transaminitis secondary to liver failure/shock liver: Most likely secondary to EtOH abuse.  Continue supportive care   LOS: [facility] 5/24/202310:36 AM

## 2021-06-28 NOTE — Progress Notes (Signed)
NAME:  Robert Coffey, MRN:  [id number], DOB:  07-21-1970, LOS: 8 ADMISSION DATE:  06/09/2021, CONSULTATION DATE:  06/22/2021 REFERRING MD:  Dr. Jimmye Norman, CHIEF COMPLAINT:  Acute Respiratory Distress, AMS   Brief Pt Description / Synopsis:  51 year old male admitted with acute metabolic encephalopathy secondary to alcohol withdrawal and severe hyponatremia requiring hypertonic saline.  On 5/18 developed acute hypoxic & hypercapnic respiratory failure requiring intubation and mechanical ventilation.  History of Present Illness:  Robert Coffey is a 51 year old male with a past medical history significant for alcohol abuse, obesity, chronic back pain, peripheral neuropathy who presented to [facility] ED on 06/28/2021 due to altered mental status, poor p.o. intake, progressive weakness with difficulty getting around at home.  Patient is now intubated and unable to contribute to history, therefore history is obtained from patient's wife at bedside and chart review.  Per the patient's wife, the patient has not been feeling well with poor p.o. intake for approximately a month, however has continued to drink alcohol daily (usually 3-4 drinks of liquor) during that time.  Over the past several days he has become progressively weak with difficulty getting around at home (not even in bed able to get out of bed), intermittent confusion, and diffuse abdominal cramping/nausea/vomiting/diarrhea.  Denies any history of falls, chest pain, shortness of breath, fever, cough.  Last known alcoholic drink was the day prior to admission on Sunday.  ED Course: Initial Vital Signs: Temperature 98.1 F orally, blood pressure 136/119, respiratory rate 18, pulse 106, SPO2 95% Significant Labs: Sodium 109, chloride 69, glucose 108, BUN 8, creatinine 1.62, anion gap 17, albumin 3.1, lipase 86, AST 256, ALT 119, total bilirubin 5.0, high-sensitivity troponin 67, serum osmolality 236, urine osmolality 437, urine sodium less than 10 WBC  11.0, platelets 129, TSH 2.1 COVID-19 and influenza PCR negative Urinalysis negative for UTI Urine drug screen positive for tricyclics Ethyl alcohol less than 10 Imaging CT head without contrast>>IMPRESSION: 1. Fluid or debris in the left sinus tympani/middle ear, cannot exclude otitis media. There small bilateral mastoid effusions and chronic bilateral maxillary sinusitis. 2. Advanced for age cerebral atrophy. 3. Atherosclerosis. CT abdomen and pelvis>>IMPRESSION: Hepatic steatosis. 2.6 cm enhancing left adrenal nodule is noted. When the patient is clinically stable and able to follow directions and hold their breath (preferably as an outpatient) further evaluation with dedicated abdominal MRI should be considered. Medications Administered: 1 L normal saline bolus, hypertonic saline infusion  He was admitted by the hospitalist for further work-up and treatment of acute metabolic encephalopathy in the setting of DTs and severe hyponatremia.  Please see "significant hospital events" section below for full detailed hospital course.  Pertinent  Medical History  Alcohol abuse Obesity Chronic back  Micro Data:  5/16: SARS-CoV-2 and influenza PCR>> negative 5/16: HIV screen>> nonreactive 5/16: MRSA PCR>> negative 5/18: Tracheal aspirate>>gram + rods & cocci, gram - rods 5/19: Blood x2>>  Antimicrobials:  5/18: Unasyn>>x2 doses  5/18: Cefepime>>5/19 5/18: Vancomycin>>5/19 5/18: Flagyl>>5/21 5/22: Unasyn>>  Significant Hospital Events: Including procedures, antibiotic start and stop dates in addition to other pertinent events   5/16: Admitted by hospitalist for acute metabolic encephalopathy in setting of severe hyponatremia and DTs.  Requiring hypertonic saline 5/18: Developed acute hypoxic respiratory failure, possible concern for aspiration.  Required intubation and mechanical ventilation. 5/19: Pt with worsening acute renal failure with severe metabolic and lactic acidosis  requiring CRRT.  Severely hypotensive requiring epinephrine, levophed, and vasopressin gtts to maintain map >65  5/20: Remains critically ill, on CRRT.  Vent support slightly weaned to 60% FiO2 & 10 PEEP. Some improvement in vasopressor requirements (off of epinephrine and phenylephrine) 5/21: Overnight weaned off of Giapreza, currently only on norepinephrine and vasopressin 5/22: No acute events overnight on minimal ventilator settings FiO2 30% PEEP 8.  Remains on levophed gtt to maintain map >65.  Will perform WUA  5/22: CT Head revealed No acute intracranial findings. No        change from CT 06/18/2021.  Interim History / Subjective:  No acute events overnight on minimal ventilator settings FiO2 30% PEEP 8.  Off vasopressor.  Opens eyes to verbal stimulation, however unable to follow commands   Objective   Blood pressure 117/77, pulse (!) 102, temperature (!) 97.2 F (36.2 C), temperature source Esophageal, resp. rate 19, height $RemoveBe'6\' 3"'[id number]$  (1.905 m), weight (!) 151 kg, SpO2 95 %.    Vent Mode: PRVC FiO2 (%):  [24 %] 24 % Set Rate:  [22 bmp] 22 bmp Vt Set:  [650 mL] 650 mL PEEP:  [5 cmH20] 5 cmH20 Plateau Pressure:  [18 cmH20] 18 cmH20   Intake/Output Summary (Last 24 hours) at 06/28/2021 0836 Last data filed at 06/28/2021 0800 Gross per 24 hour  Intake 2022.26 ml  Output 2634 ml  Net -611.74 ml   Filed Weights   06/26/21 0500 06/27/21 0413 06/28/21 0331  Weight: (!) 151.3 kg (!) 150.7 kg (!) 151 kg   Examination: General: Critically ill-appearing obese male, laying in bed, NAD mechanically intubated  HENT: Atraumatic, normocephalic, neck supple, difficult to assess JVD due to body habitus Lungs: Faint rhonchi throughout, even, non labored  Cardiovascular: NSR, rrr no murmurs, rubs, gallops, 1+ generalized edema  Abdomen: +BS x4, obese, soft, non distended  Extremities: Normal bulk and tone Neuro: Sedated, opens eyes to verbal stimulation, not following commands, PERRL, scleredema   GU: Indwelling foley catheter draining yellow urine   Imaging     Assessment & Plan:  Acute hypoxic & hypercapnic respiratory failure in the setting of suspected aspiration in the setting of severe DTs Pulmonary Embolism - cannot r/o -Full vent support, on lung protective strategies -Plateau pressures less than 30 cm H20 -Wean FiO2 & PEEP as tolerated to maintain O2 sats >92% -Follow intermittent Chest X-ray & ABG as needed -Spontaneous Breathing Trials when respiratory parameters met and mental status permits -Continue VAP Bundle -PRN Bronchodilators -Continue unasyn  -Venous US Bilateral LE negative for DVT  Septic shock~improving   Elevated troponin suspect secondary to demand ischemia -Continuous cardiac monitoring -Maintain MAP >65 -Vasopressors as needed to maintain MAP goal -Continue midodrine and stress dose steroids  -HS Troponin peaked at 67 -Echocardiogram 06/23/21: LVEF 60-65%, Grade I DD, normal RV systolic function  Severe Sepsis due to Suspected aspiration~improving  CT head 5/16 unable to exclude left otitis media and chronic bilateral maxillary sinusitis  Respiratory culture 05/18>>few normal respiratory flora-no staph aureus or   pseudomonas seen  -Monitor fever curve -Trend WBC's & Procalcitonin -Continue unasyn   Hypotonic hyponatremia, suspect secondary to dehydration & poor p.o. intake~RESOLVED Acute kidney injury secondary to ATN with severe metabolic acidosis  Lactic acidosis persistent likely due to hepatic impairment -Trend BMP, lactic acid, and ABG -Persistent lactic acid elevation likely due to hepatic dysfunction -Replace electrolytes as indicated  -Monitor UOP -Nephrology following, appreciate input: continue CRRT   Transaminitis secondary to hepatic steatosis & ETOH abuse & Shock liver -Trend LFTs and coags -AFP tumor marker normal  Thrombocytopenia, suspect secondary to alcohol abuse -Trend CBC -Monitor for  s/sx of bleeding and  transfuse for Hgb <7 -Transfuse platelets for platelet count less than 50 with active bleeding  Incidental finding of left adrenal nodule via CT Abd/Pelvis  -Will need abdominal MRI for further evaluation in the outpatient setting   Acute metabolic encephalopathy in the setting of severe DTs and severe hyponatremia Sedation needs in the setting of mechanical ventilation -Maintain a RASS goal of 0 to -1 -Fentanyl and versed as needed to maintain RASS goal -Avoid sedating medications as able -Daily wake up assessment -Continue thiamine  -Continue folic acid and multivitamin -Continue CIWA protocol -Continue lactulose and rifaximin trend ammonia level  -Will order MRI Brain if pt unable to follow commands in the next 24 hrs   Morbid obesity with sarcopenia Protein malnutrition -Calorie excess, protein deficiency in the setting of alcoholism -Dietitian consulted for initiation of TF's  Best Practice (right click and "Reselect all SmartList Selections" daily)  Diet/type: TF's DVT prophylaxis: Subq heparin  GI prophylaxis: PPI Lines: Central line, and is still needed Foley:  Yes, and it is still needed Code Status: DNR  Last date of multidisciplinary goals of care discussion [06/28/21]  Will update pts wife when she arrives at bedside  Labs   CBC: Recent Labs  Lab 06/24/21 0338 06/25/21 0320 06/26/21 0316 06/27/21 0357 06/28/21 0335  WBC 17.4* 16.3* 15.1* 14.1* 12.6*  HGB 13.9 14.0 13.9 14.0 13.8  HCT 42.0 42.1 40.5 41.6 40.8  MCV 112.6* 114.1* 111.3* 110.6* 111.5*  PLT 117* 107* 85* 87* 62*    Basic Metabolic Panel: Recent Labs  Lab 06/25/21 0320 06/25/21 0332 06/25/21 1603 06/26/21 0316 06/26/21 1425 06/27/21 0357 06/27/21 1631 06/28/21 0335  NA  --    < > 137 137 137 136 139 139  K  --    < > 3.9 3.5 3.2* 3.6 3.3* 3.9  CL  --    < > 104 104 104 105 108 107  CO2  --    < > 22 21* 22 22 20* 22  GLUCOSE  --    < > 121* 123* 125* 126* 143* 156*  BUN  --    < >  27* 28* 31* 34* 38* 41*  CREATININE  --    < > 1.77* 1.76* 1.93* 1.93* 2.15* 2.17*  CALCIUM  --    < > 9.0 9.2 9.3 8.4* 7.5* 7.6*  MG 2.3  --  2.2 2.2 2.1  --  2.4  --   PHOS  --    < > 2.6 2.4* 2.6 1.7* 1.5* 2.0*   < > = values in this interval not displayed.   GFR: Estimated Creatinine Clearance: 65.4 mL/min (A) (by C-G formula based on SCr of 2.17 mg/dL (H)). Recent Labs  Lab 06/23/21 0717 06/23/21 1140 06/24/21 0338 06/24/21 0339 06/25/21 0320 06/25/21 0332 06/26/21 0316 06/27/21 0357 06/27/21 1001 06/27/21 2219 06/28/21 0335  PROCALCITON 16.69  --  7.12  --  5.62  --   --   --   --   --   --   WBC  --   --  17.4*  --  16.3*  --  15.1* 14.1*  --   --  12.6*  LATICACIDVEN  --    < >  --    < >  --  4.1* 3.3*  --  3.3* 3.0*  --    < > = values in this interval not displayed.    Liver Function Tests: Recent Labs  Lab 06/23/21  0425 06/23/21 1519 06/24/21 0339 06/24/21 1406 06/25/21 0320 06/25/21 0332 06/26/21 0332 06/26/21 1425 06/27/21 0357 06/27/21 1631 06/28/21 0335  AST 752*  --  1,148*  --  1,000*  --  665*  --   --   --   --   ALT 175*  --  280*  --  289*  --  251*  --   --   --   --   ALKPHOS 107  --  138*  --  125  --  110  --   --   --   --   BILITOT 6.3*  --  8.0*  --  7.9*  --  7.2*  --   --   --   --   PROT 5.9*  --  6.0*  --  5.8*  --  5.5*  --   --   --   --   ALBUMIN 2.5*   < > 2.4*   < > 2.3*   < > 2.1* 2.1* 2.0* 1.8* 1.9*   < > = values in this interval not displayed.   No results for input(s): LIPASE, AMYLASE in the last 168 hours.  Recent Labs  Lab 06/22/21 1531 06/23/21 1140 06/25/21 0332 06/27/21 0357  AMMONIA 96* 40* 85* 59*    ABG    Component Value Date/Time   PHART 7.39 06/25/2021 0508   PCO2ART 36 06/25/2021 0508   PO2ART 99 06/25/2021 0508   HCO3 21.8 06/25/2021 0508   ACIDBASEDEF 2.6 (H) 06/25/2021 0508   O2SAT 99.5 06/25/2021 0508      Coagulation Profile: Recent Labs  Lab 06/22/21 2010 06/23/21 0425  INR 1.5*  2.0*    Cardiac Enzymes: No results for input(s): CKTOTAL, CKMB, CKMBINDEX, TROPONINI in the last 168 hours.  HbA1C: No results found for: HGBA1C  CBG: Recent Labs  Lab 06/27/21 1520 06/27/21 1934 06/27/21 2327 06/28/21 0427 06/28/21 0728  GLUCAP 172* 163* 129* 144* 137*    Review of Systems:   Unable to assess due to intubation/sedation/critical illness  Allergies Allergies  Allergen Reactions   Sulfa Antibiotics      Home Medications  Prior to Admission medications   Medication Sig Start Date End Date Taking? Authorizing Provider  atorvastatin (LIPITOR) 20 MG tablet Take 20 mg by mouth daily. 02/03/21  Yes [provider]  cyanocobalamin 1000 MCG tablet Take by mouth.   Yes [provider]  hydrochlorothiazide (HYDRODIURIL) 25 MG tablet Take 25 mg by mouth daily. 05/30/21  Yes [provider]  lisinopril (ZESTRIL) 40 MG tablet Take 40 mg by mouth daily. 05/04/21  Yes [provider]  rOPINIRole (REQUIP) 0.25 MG tablet Take 0.25 mg by mouth 3 (three) times daily. 07/28/20  Yes [provider]  vitamin C (ASCORBIC ACID) 500 MG tablet Take 500 mg by mouth daily.   Yes [provider]  zolpidem (AMBIEN) 10 MG tablet Take 10 mg by mouth at bedtime as needed. 06/07/21  Yes [provider]     Scheduled Meds:  chlorhexidine gluconate (MEDLINE KIT)  15 mL Mouth Rinse BID   Chlorhexidine Gluconate Cloth  6 each Topical Q0600   feeding supplement (PROSource TF)  90 mL Per Tube QID   folic acid  1 mg Intravenous Daily   free water  30 mL Per Tube Q4H   heparin injection (subcutaneous)  5,000 Units Subcutaneous Q12H   lactulose  20 g Per Tube TID   mouth rinse  15 mL Mouth Rinse  10 times per day   midodrine  10 mg Per Tube TID WC   multivitamin with minerals  1 tablet Per Tube Daily   pantoprazole sodium  40 mg Per Tube Daily   potassium & sodium phosphates  2 packet Per Tube TID WC & HS   rifaximin  550 mg Per Tube  BID   thiamine injection  100 mg Intravenous Q24H   Continuous Infusions:   prismasol BGK 4/2.5 500 mL/hr at 06/27/21 2153    prismasol BGK 4/2.5 500 mL/hr at 06/27/21 2152   sodium chloride 10 mL/hr at 06/28/21 0800   sodium chloride Stopped (06/26/21 1129)   ampicillin-sulbactam (UNASYN) IV Stopped (06/28/21 0024)   dexmedetomidine (PRECEDEX) IV infusion Stopped (06/26/21 1909)   feeding supplement (VITAL AF 1.2 CAL) 60 mL/hr at 06/27/21 1200   fentaNYL infusion INTRAVENOUS Stopped (06/26/21 0819)   norepinephrine (LEVOPHED) Adult infusion Stopped (06/27/21 1005)   prismasol BGK 4/2.5 2,500 mL/hr at 06/28/21 0425   sodium chloride 0.9 % 100 mL with hydrocortisone sodium succinate (SOLU-CORTEF) 100 mg infusion 8.3 mL/hr at 06/28/21 0800   PRN Meds:.sodium chloride, acetaminophen **OR** acetaminophen, fentaNYL, heparin, ondansetron **OR** ondansetron (ZOFRAN) IV   Critical care time: 40 minutes     Donell Beers, [place] Pager [phone] (please enter 7 digits) PCCM Consult Pager [phone] (please enter 7 digits)

## 2021-06-28 NOTE — Progress Notes (Signed)
Per Dr. Anthonette Legato, CRRT can be discontinued with the plan to transition to hemodialysis tomorrow (as previously discussed). CRRT is being discontinued tonight due to increased filter pressures which would require a replacement filter change. Patient's vitals are stable and he remains off of all pressors. Will continue to monitor.  Cameron Ali, RN

## 2021-06-28 NOTE — Consult Note (Signed)
[facility] for Electrolyte Monitoring and Replacement   Recent Labs: Potassium (mmol/L)  Date Value  06/28/2021 3.9   Magnesium (mg/dL)  Date Value  06/27/2021 2.4   Calcium (mg/dL)  Date Value  06/28/2021 7.6 (L)   Albumin (g/dL)  Date Value  06/28/2021 1.9 (L)   Phosphorus (mg/dL)  Date Value  06/28/2021 2.0 (L)   Sodium (mmol/L)  Date Value  06/28/2021 139   Assessment: Patient is a 51 y/o M with medical history including obesity, chronic back pain, EtOH use disorder who presented to the ED 5/16 with weakness in setting of abdominal cramps, nausea, vomiting, and diarrhea. Patient subsequently admitted for severe symptomatic hyponatremia. Patient decompensated on 5/18 with acute onset respiratory failure requiring intubation. Patient remains admitted to the ICU where he is intubated, sedated, and on mechanical ventilation. There is further concern for septic shock and patient is requiring multiple vasopressor infusions. Admission further complicated by acute renal failure necessitating initiation of CRRT on 5/19. Pharmacy consulted to assist with electrolyte monitoring and replacement as indicated.  Nutrition: Tube feeds initiated 5/22 Cathartics: Lactulose 20 g TID for hepatic encephalopathy  Goal of Therapy:  Electrolytes within normal limits  Plan:  --Phos 2, continue Phos-Nak 2 packets TIDHS given on-going CRRT and re-feeding with initiation of tube feeds --Renal function panels ordered BID while on CRRT --Per nephrology, if patient remains stable today, plan is to transition off of CRRT 5/25 and onto intermittent HD  Benita Gutter 06/28/2021 11:01 AM

## 2021-06-29 ENCOUNTER — Inpatient Hospital Stay: Payer: BC Managed Care – PPO

## 2021-06-29 DIAGNOSIS — N179 Acute kidney failure, unspecified: Secondary | ICD-10-CM | POA: Diagnosis not present

## 2021-06-29 DIAGNOSIS — E871 Hypo-osmolality and hyponatremia: Secondary | ICD-10-CM | POA: Diagnosis not present

## 2021-06-29 DIAGNOSIS — G9341 Metabolic encephalopathy: Secondary | ICD-10-CM | POA: Diagnosis not present

## 2021-06-29 DIAGNOSIS — F10931 Alcohol use, unspecified with withdrawal delirium: Secondary | ICD-10-CM | POA: Diagnosis not present

## 2021-06-29 LAB — BLOOD GAS, ARTERIAL
Acid-base deficit: 3.3 mmol/L — ABNORMAL HIGH (ref 0.0–2.0)
Bicarbonate: 20.3 mmol/L (ref 20.0–28.0)
FIO2: 30 %
MECHVT: 650 mL
O2 Saturation: 98.3 %
PEEP: 5 cmH2O
Patient temperature: 37
RATE: 22 resp/min
pCO2 arterial: 32 mmHg (ref 32–48)
pH, Arterial: 7.41 (ref 7.35–7.45)
pO2, Arterial: 94 mmHg (ref 83–108)

## 2021-06-29 LAB — URINALYSIS, COMPLETE (UACMP) WITH MICROSCOPIC
Bilirubin Urine: NEGATIVE
Glucose, UA: 50 mg/dL — AB
Ketones, ur: NEGATIVE mg/dL
Leukocytes,Ua: NEGATIVE
Nitrite: NEGATIVE
Protein, ur: 100 mg/dL — AB
Specific Gravity, Urine: 1.032 — ABNORMAL HIGH (ref 1.005–1.030)
pH: 5 (ref 5.0–8.0)

## 2021-06-29 LAB — HEPATIC FUNCTION PANEL
ALT: 229 U/L — ABNORMAL HIGH (ref 0–44)
AST: 486 U/L — ABNORMAL HIGH (ref 15–41)
Albumin: 1.9 g/dL — ABNORMAL LOW (ref 3.5–5.0)
Alkaline Phosphatase: 110 U/L (ref 38–126)
Bilirubin, Direct: 4.2 mg/dL — ABNORMAL HIGH (ref 0.0–0.2)
Indirect Bilirubin: 3 mg/dL — ABNORMAL HIGH (ref 0.3–0.9)
Total Bilirubin: 7.2 mg/dL — ABNORMAL HIGH (ref 0.3–1.2)
Total Protein: 5.2 g/dL — ABNORMAL LOW (ref 6.5–8.1)

## 2021-06-29 LAB — RENAL FUNCTION PANEL
Albumin: 1.9 g/dL — ABNORMAL LOW (ref 3.5–5.0)
Anion gap: 9 (ref 5–15)
BUN: 52 mg/dL — ABNORMAL HIGH (ref 6–20)
CO2: 22 mmol/L (ref 22–32)
Calcium: 7.3 mg/dL — ABNORMAL LOW (ref 8.9–10.3)
Chloride: 109 mmol/L (ref 98–111)
Creatinine, Ser: 2.77 mg/dL — ABNORMAL HIGH (ref 0.61–1.24)
GFR, Estimated: 27 mL/min — ABNORMAL LOW (ref >60)
Glucose, Bld: 131 mg/dL — ABNORMAL HIGH (ref 70–99)
Phosphorus: 3.2 mg/dL (ref 2.5–4.6)
Potassium: 4.5 mmol/L (ref 3.5–5.1)
Sodium: 140 mmol/L (ref 135–145)

## 2021-06-29 LAB — MRSA NEXT GEN BY PCR, NASAL: MRSA by PCR Next Gen: NOT DETECTED

## 2021-06-29 LAB — GLUCOSE, CAPILLARY
Glucose-Capillary: 118 mg/dL — ABNORMAL HIGH (ref 70–99)
Glucose-Capillary: 126 mg/dL — ABNORMAL HIGH (ref 70–99)
Glucose-Capillary: 138 mg/dL — ABNORMAL HIGH (ref 70–99)
Glucose-Capillary: 141 mg/dL — ABNORMAL HIGH (ref 70–99)
Glucose-Capillary: 145 mg/dL — ABNORMAL HIGH (ref 70–99)
Glucose-Capillary: 146 mg/dL — ABNORMAL HIGH (ref 70–99)
Glucose-Capillary: 158 mg/dL — ABNORMAL HIGH (ref 70–99)

## 2021-06-29 LAB — CBC
HCT: 41.7 % (ref 39.0–52.0)
Hemoglobin: 13.9 g/dL (ref 13.0–17.0)
MCH: 37.4 pg — ABNORMAL HIGH (ref 26.0–34.0)
MCHC: 33.3 g/dL (ref 30.0–36.0)
MCV: 112.1 fL — ABNORMAL HIGH (ref 80.0–100.0)
Platelets: 62 10*3/uL — ABNORMAL LOW (ref 150–400)
RBC: 3.72 MIL/uL — ABNORMAL LOW (ref 4.22–5.81)
RDW: 19.9 % — ABNORMAL HIGH (ref 11.5–15.5)
WBC: 13.9 10*3/uL — ABNORMAL HIGH (ref 4.0–10.5)
nRBC: 6.2 % — ABNORMAL HIGH (ref 0.0–0.2)

## 2021-06-29 LAB — HEPATITIS B SURFACE ANTIGEN: Hepatitis B Surface Ag: NONREACTIVE

## 2021-06-29 LAB — HEPATITIS B SURFACE ANTIBODY,QUALITATIVE: Hep B S Ab: NONREACTIVE

## 2021-06-29 IMAGING — US US EXTREM LOW VENOUS
1 series · 13 of 24 positions shown · non-contrast
Comparison: Bilateral lower extremity venous Doppler
ultrasound-[DATE] (negative)

CLINICAL DATA: Bilateral lower extremity edema.  Evaluate for DVT.



[Series 1: us venous img lower bilat (dvt) · portal-venous · 13 of 58 slices shown]
[im 1/58]
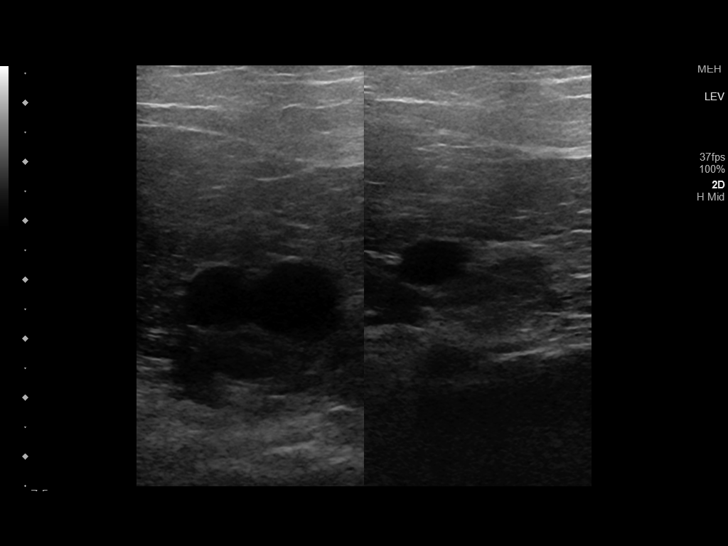
[im 5/58]
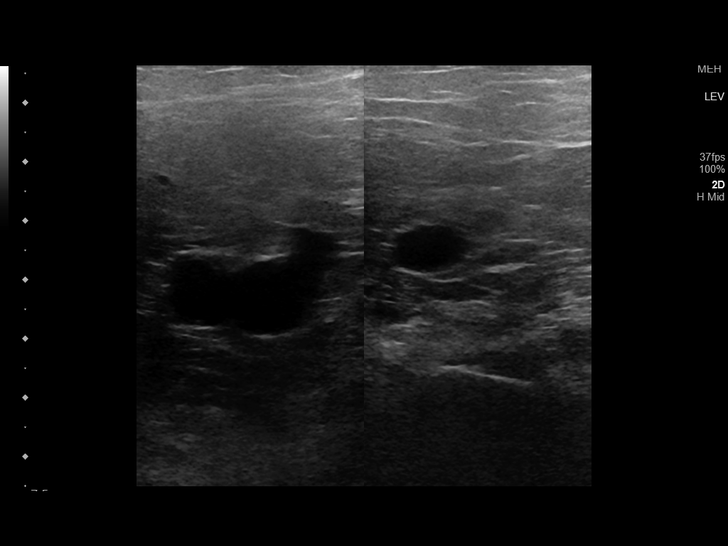
[im 10/58]
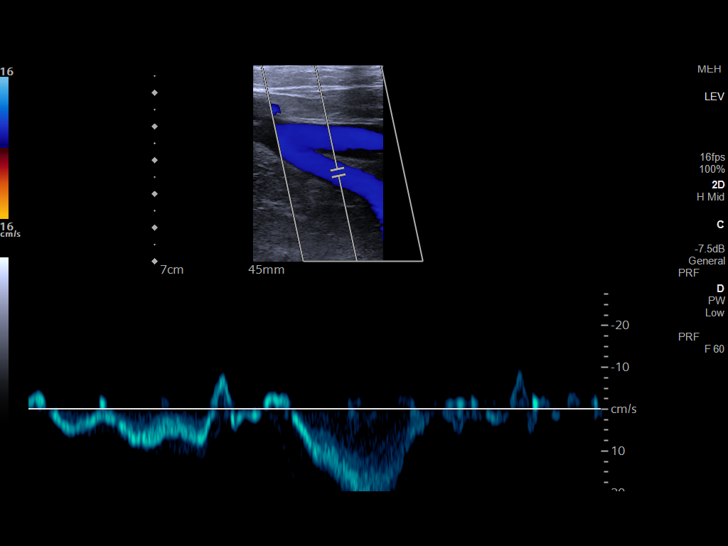
[im 15/58]
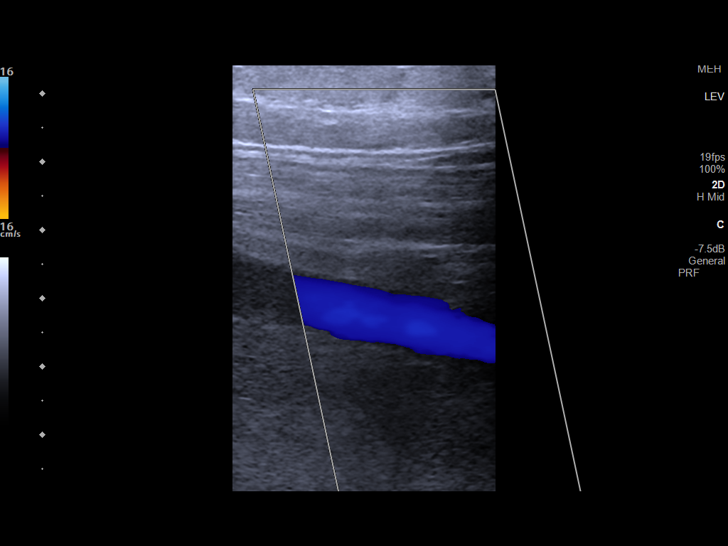
[im 20/58]
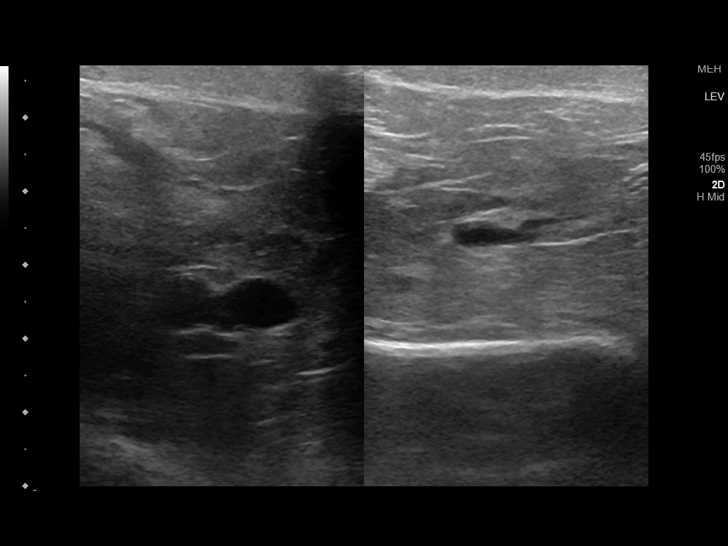
[im 25/58]
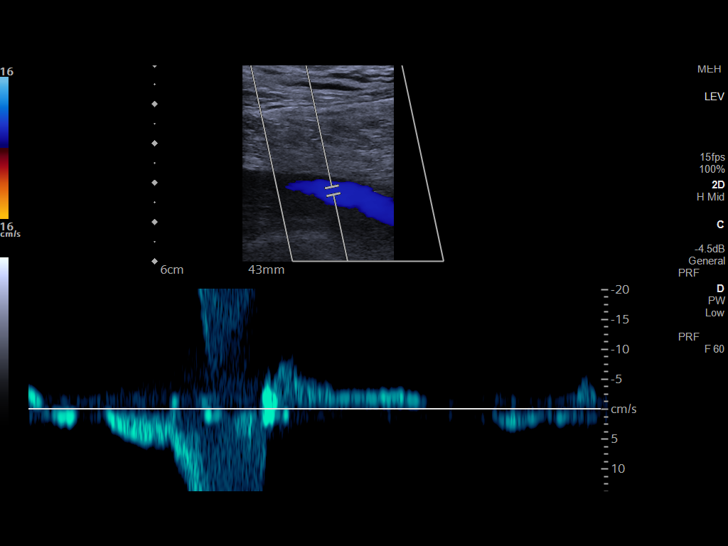
[im 30/58]
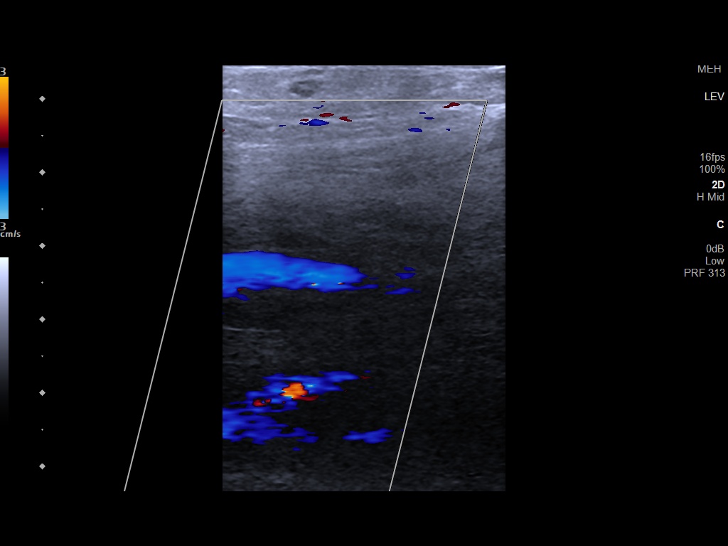
[im 33/58]
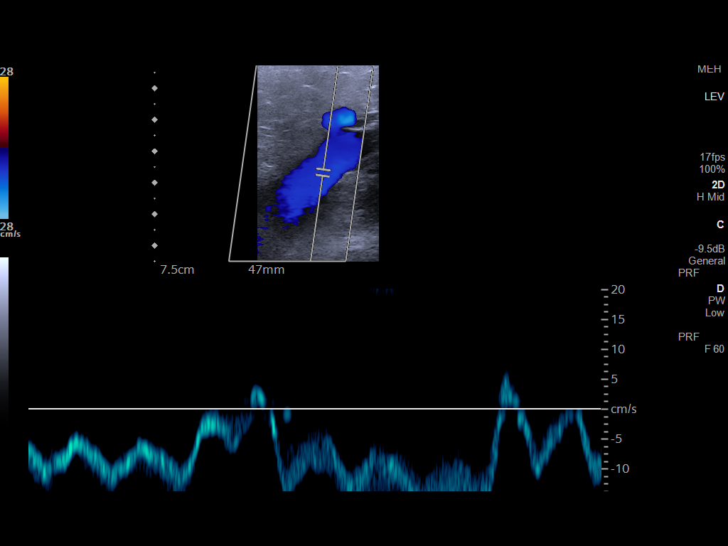
[im 38/58]
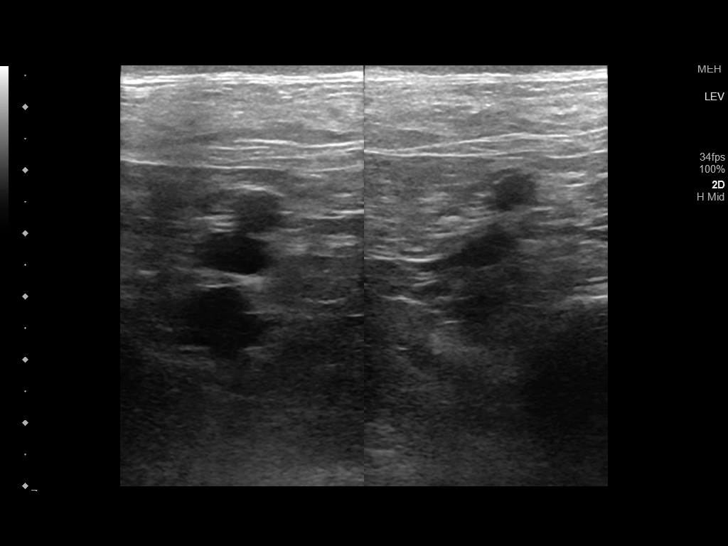
[im 43/58]
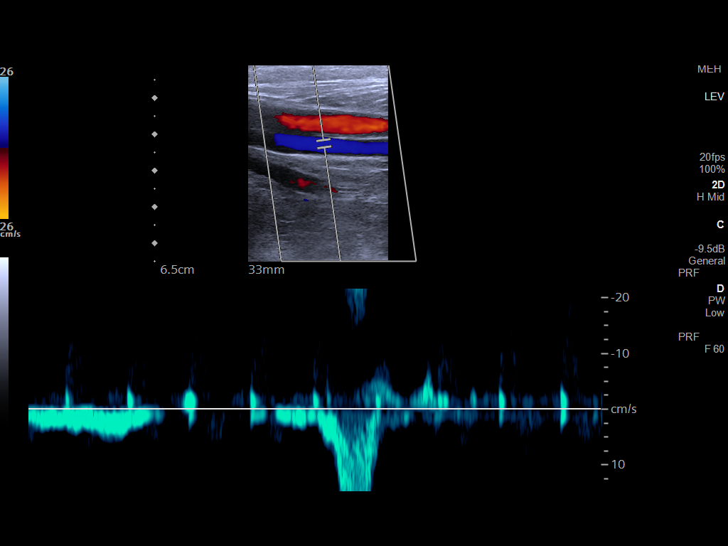
[im 48/58]
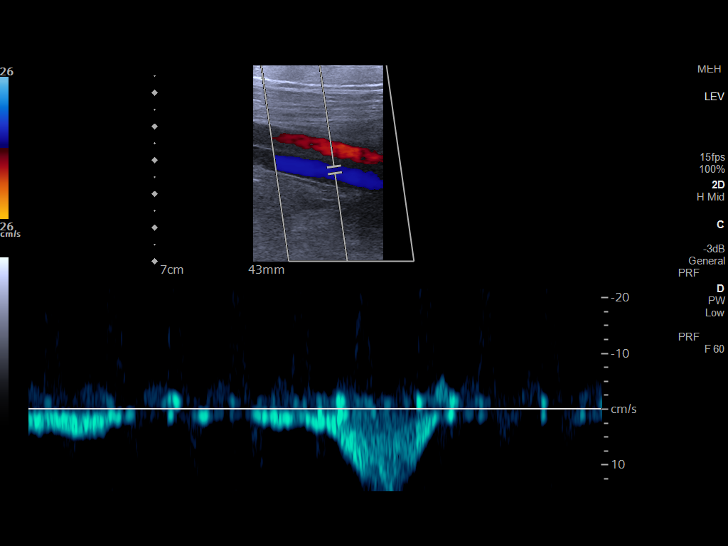
[im 53/58]
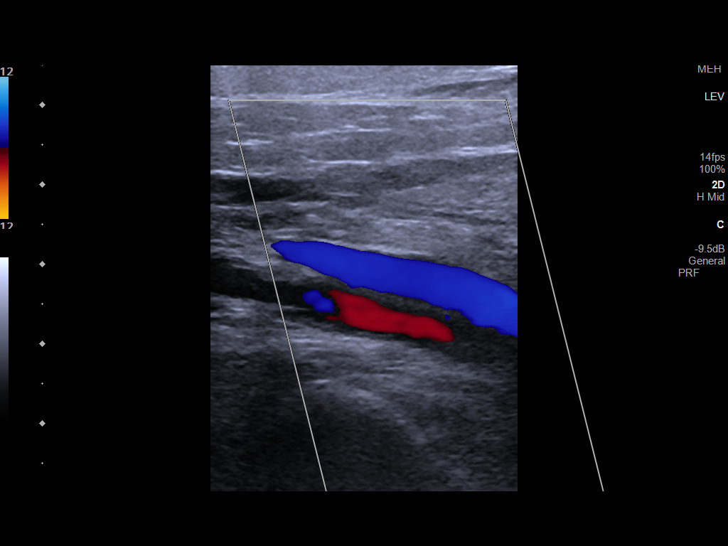
[im 58/58]
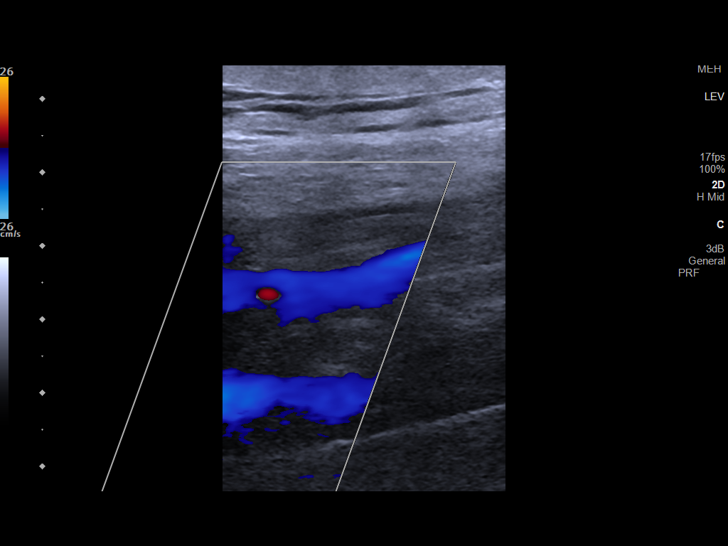

[13 of 24 positions shown; findings below may reference images not displayed]

FINDINGS: RIGHT LOWER EXTREMITY

Common Femoral Vein: No evidence of thrombus. Normal
compressibility, respiratory phasicity and response to augmentation.

Saphenofemoral Junction: No evidence of thrombus. Normal
compressibility and flow on color Doppler imaging.

Profunda Femoral Vein: No evidence of thrombus. Normal
compressibility and flow on color Doppler imaging.

Femoral Vein: No evidence of thrombus. Normal compressibility,
respiratory phasicity and response to augmentation.

Popliteal Vein: No evidence of thrombus. Normal compressibility,
respiratory phasicity and response to augmentation.

Calf Veins: No evidence of thrombus. Normal compressibility and flow
on color Doppler imaging.

Superficial Great Saphenous Vein: No evidence of thrombus. Normal
compressibility.

Other Findings:  None.

LEFT LOWER EXTREMITY

Common Femoral Vein: No evidence of thrombus. Normal
compressibility, respiratory phasicity and response to augmentation.

Saphenofemoral Junction: No evidence of thrombus. Normal
compressibility and flow on color Doppler imaging.

Profunda Femoral Vein: No evidence of thrombus. Normal
compressibility and flow on color Doppler imaging.

Femoral Vein: No evidence of thrombus. Normal compressibility,
respiratory phasicity and response to augmentation.

Popliteal Vein: No evidence of thrombus. Normal compressibility,
respiratory phasicity and response to augmentation.

Calf Veins: No evidence of thrombus. Normal compressibility and flow
on color Doppler imaging.

Superficial Great Saphenous Vein: No evidence of thrombus. Normal
compressibility.

Other Findings:  None.
IMPRESSION: No evidence of DVT within either lower extremity.

## 2021-06-29 IMAGING — MR MR HEAD W/O CM
12 series · 48 of 48 positions shown · non-contrast
Comparison: Head CT [DATE]

CLINICAL DATA: Mental status change, unknown cause. Acute metabolic
encephalopathy.

EXAM:
MRI HEAD WITHOUT CONTRAST
TECHNIQUE: Multiplanar, multiecho pulse sequences of the brain and surrounding
structures were obtained without intravenous contrast.

[Series 5: ax dwi_tracew · axial · 3.0mm · 0.65mm/px · z∈[-30,+111]mm · 3 of 48 slices shown]
[im 1/48]
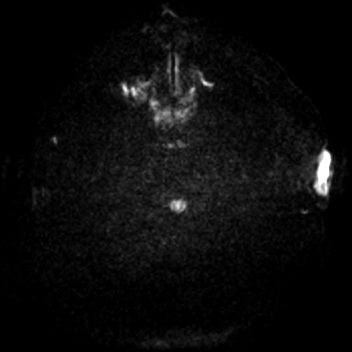
[im 24/48]
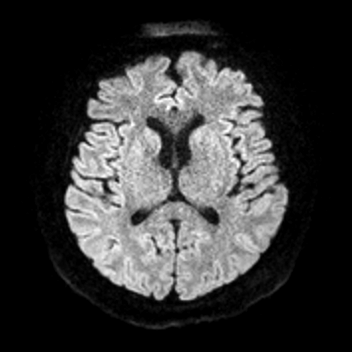
[im 48/48]
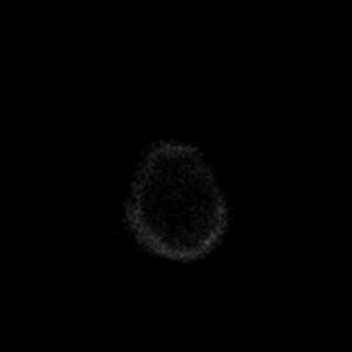

[Series 6: ax dwi_adc · axial · 3.0mm · 0.65mm/px · z∈[-30,+111]mm · 4 of 48 slices shown]
[im 1/48]
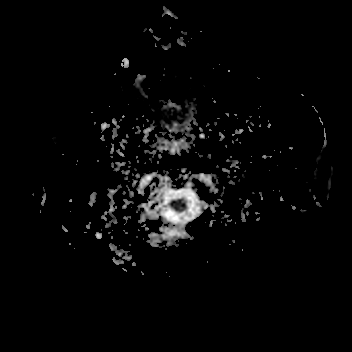
[im 16/48]
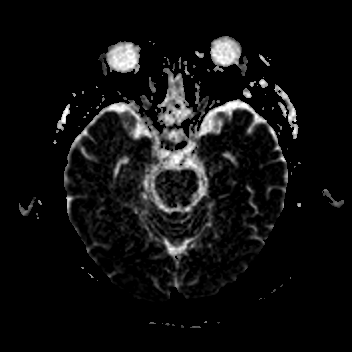
[im 32/48]
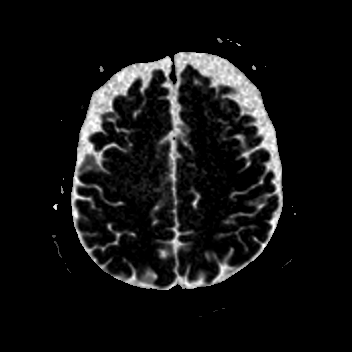
[im 48/48]
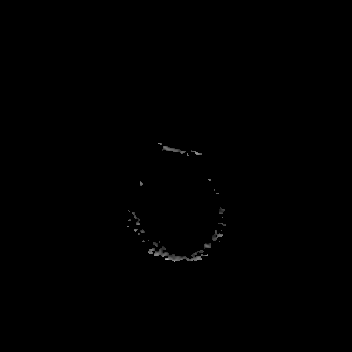

[Series 7: cor dwi_tracew · coronal · 5.0mm · 1.80mm/px · 3 of 38 slices shown]
[im 1/38]
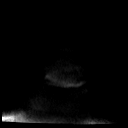
[im 19/38]
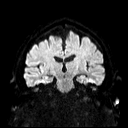
[im 38/38]
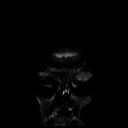

[Series 8: cor dwi_adc · coronal · 5.0mm · 1.80mm/px · 3 of 38 slices shown]
[im 1/38]
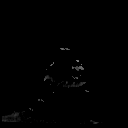
[im 19/38]
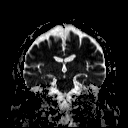
[im 38/38]
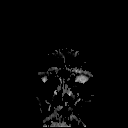

[Series 9: FLAIR · axial · 3.0mm · 0.69mm/px · z∈[-35,+112]mm · 4 of 55 slices shown]
[im 1/55]
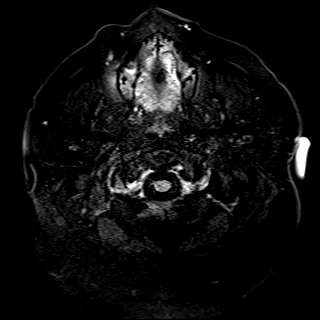
[im 19/55]
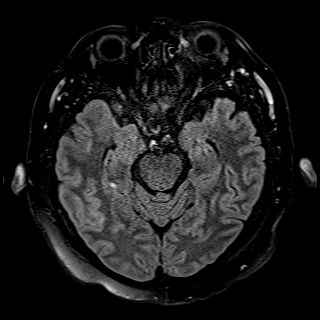
[im 37/55]
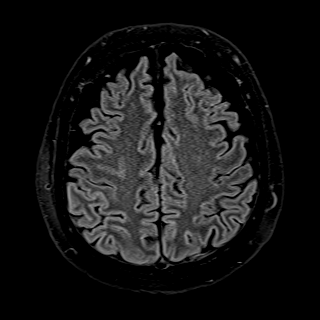
[im 55/55]
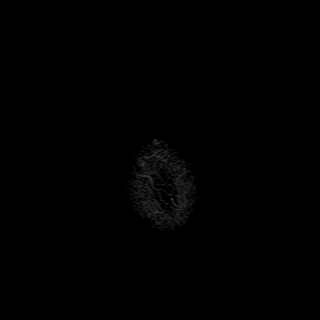

[Series 10: mag_images · axial · 3.0mm · 0.90mm/px · z∈[-28,+111]mm · 4 of 52 slices shown]
[im 1/52]
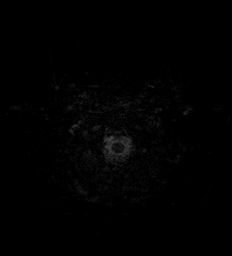
[im 18/52]
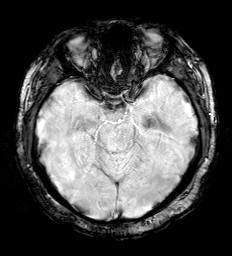
[im 35/52]
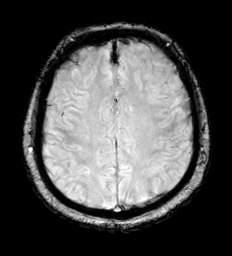
[im 52/52]
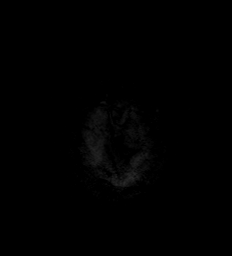

[Series 11: pha_images · axial · 3.0mm · 0.90mm/px · z∈[-28,+111]mm · 4 of 52 slices shown]
[im 1/52]
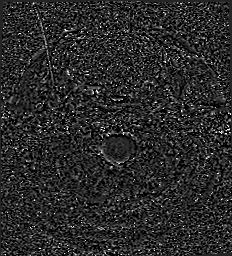
[im 18/52]
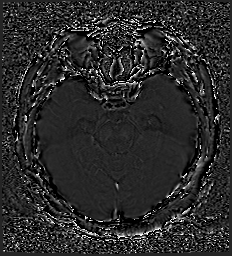
[im 35/52]
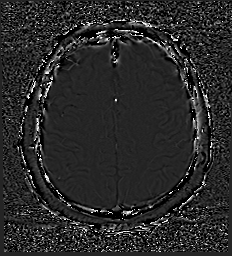
[im 52/52]
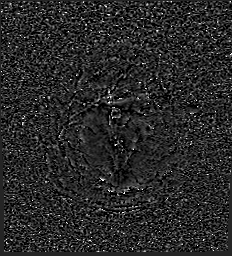

[Series 12: swi_images · axial · 3.0mm · 0.90mm/px · z∈[-28,+111]mm · 4 of 52 slices shown]
[im 1/52]
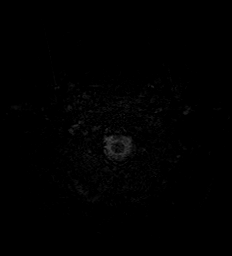
[im 18/52]
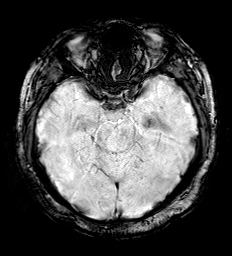
[im 35/52]
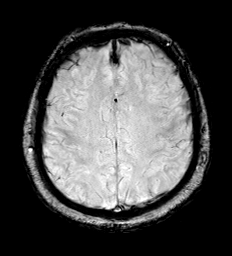
[im 52/52]
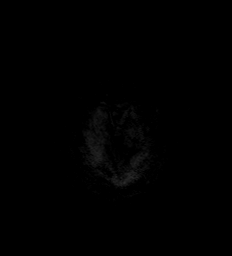

[Series 14: T1 · sagittal · 5.0mm · 0.94mm/px · 2 of 23 slices shown (1 of 2)]
[im 1/23]
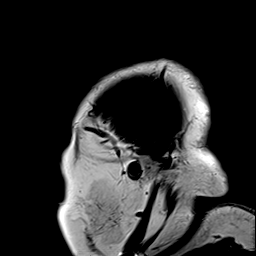
[im 23/23]
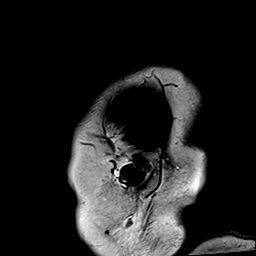

[Series 15: T2 · axial · 5.0mm · 0.45mm/px · z∈[-29,+113]mm · 2 of 27 slices shown (1 of 2)]
[im 1/27]
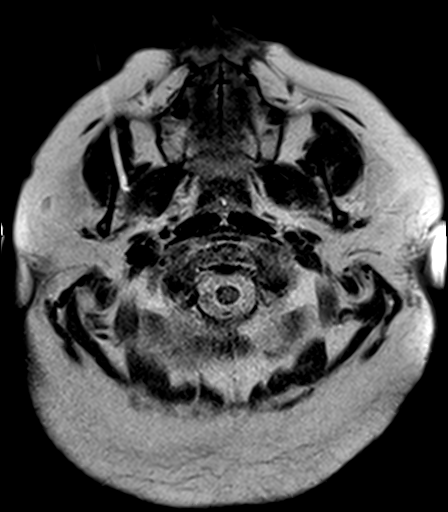
[im 27/27]
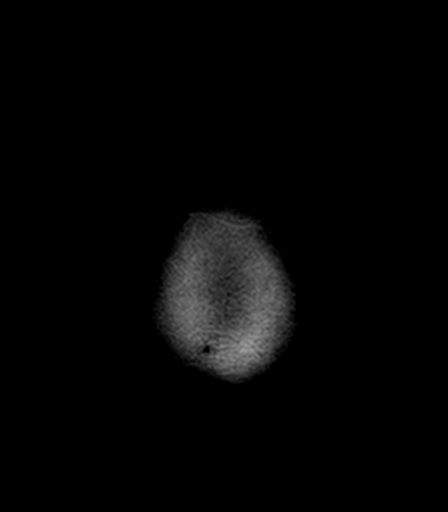

[Series 16: T2 · coronal · 5.0mm · 0.45mm/px · 2 of 31 slices shown (2 of 2)]
[im 1/31]
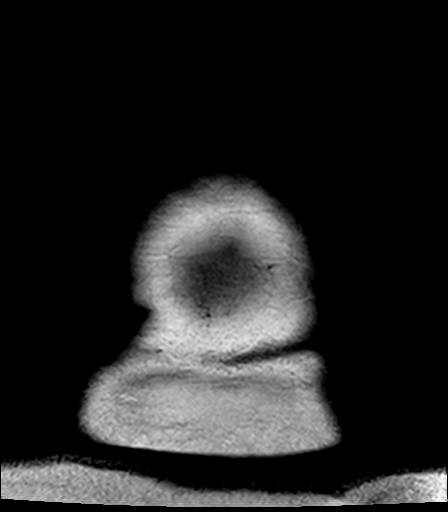
[im 31/31]
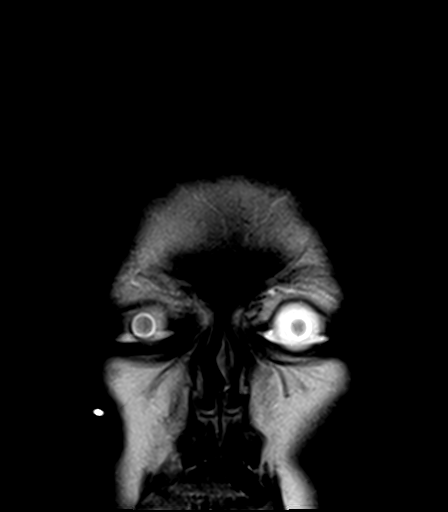

[Series 17: T1 · axial · 1.0mm · 0.98mm/px · z∈[-35,+124]mm · 13 of 176 slices shown (2 of 2)]
[im 1/176]
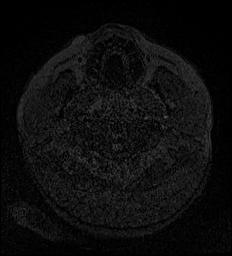
[im 15/176]
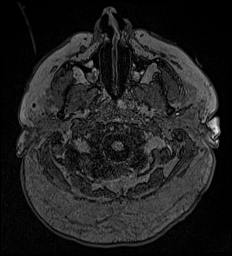
[im 30/176]
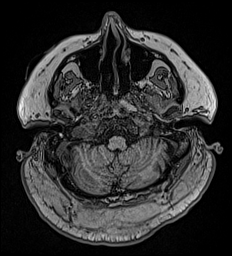
[im 44/176]
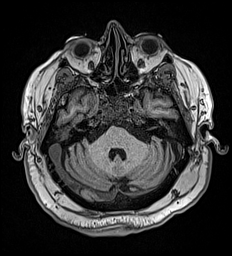
[im 59/176]
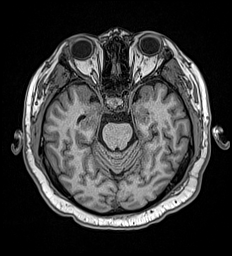
[im 73/176]
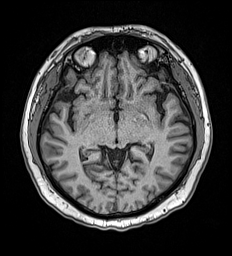
[im 88/176]
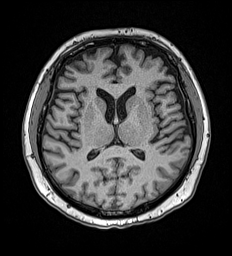
[im 103/176]
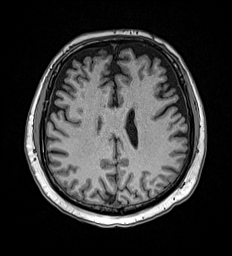
[im 117/176]
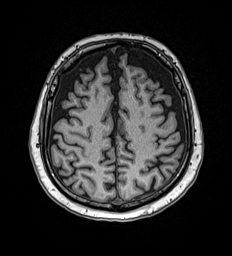
[im 132/176]
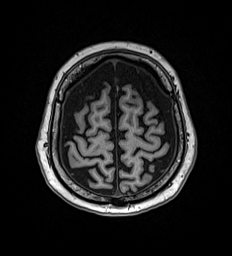
[im 146/176]
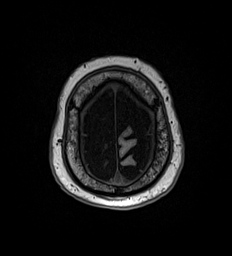
[im 161/176]
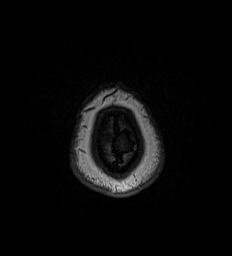
[im 176/176]
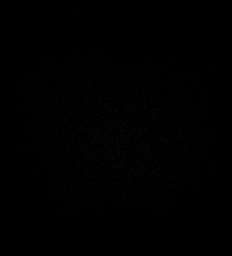

[48 of 48 positions shown; findings below may reference images not displayed]

FINDINGS: Brain: There is no evidence of an acute infarct, intracranial
hemorrhage, mass, midline shift, or extra-axial fluid collection.
There is mild cerebral atrophy. The brain is normal in signal.

Vascular: Major intracranial vascular flow voids are preserved.

Skull and upper cervical spine: Heterogeneously diminished bone
marrow T1 signal intensity in the skull base and included upper
cervical spine, nonspecific though can be seen with anemia, smoking,
obesity, and other chronic systemic disease.

Sinuses/Orbits: Unremarkable orbits. Moderate volume fluid in the
sphenoid sinuses. Polypoid mucosal thickening or small mucous
retention cysts in the maxillary sinuses. Bilateral mastoid and
middle ear effusions.

Other: None.
IMPRESSION: 1. No acute intracranial abnormality.
2. Mild cerebral atrophy.

## 2021-06-29 MED ORDER — LABETALOL HCL 5 MG/ML IV SOLN
10.0000 mg | INTRAVENOUS | Status: DC | PRN
  Administered 2021-06-29: 10 mg via INTRAVENOUS
  Filled 2021-06-29: qty 4

## 2021-06-29 MED ORDER — PIPERACILLIN-TAZOBACTAM IN DEX 2-0.25 GM/50ML IV SOLN
2.2500 g | Freq: Three times a day (TID) | INTRAVENOUS | Status: AC
  Administered 2021-06-29 – 2021-07-05 (×19): 2.25 g via INTRAVENOUS
  Filled 2021-06-29 (×21): qty 50

## 2021-06-29 NOTE — Progress Notes (Addendum)
[facility]  PROGRESS NOTE   Subjective:   Critically ill No sedation Intubated with vent 30% FiO2 and 5 PEEP No pressors, sinus tachy  Tube feeds 29ml/hr Foley-31ml in past 24 hours Lower extremity edema remains  Objective:  Vital signs: Blood pressure 118/81, pulse (!) 127, temperature (!) 102 F (38.9 C), resp. rate (!) 26, height $RemoveBe'6\' 3"'LIeRCvTRZ$  (1.905 m), weight (!) 148.4 kg, SpO2 94 %.  Intake/Output Summary (Last 24 hours) at 06/29/2021 1153 Last data filed at 06/29/2021 0400 Gross per 24 hour  Intake 1111.83 ml  Output 1739 ml  Net -627.17 ml    Filed Weights   06/27/21 0413 06/28/21 0331 06/29/21 0300  Weight: (!) 150.7 kg (!) 151 kg (!) 148.4 kg     Physical Exam: General:  No acute distress  Head:  Normocephalic, atraumatic.  Eyes: Jaundiced  Lungs:  Rhonchi throughout, normal effort  Heart:  S1S2 no rubs, tachy  Abdomen:   Soft, nontender, bowel sounds present  Extremities:  2+ peripheral edema.  Neurologic: Not following commands, no sedation  Skin:  No lesions  Access: Rt IJ Temp cath    Basic Metabolic Panel: Recent Labs  Lab 06/25/21 1603 06/26/21 0316 06/26/21 1425 06/27/21 0357 06/27/21 1631 06/28/21 0335 06/28/21 1524 06/29/21 0330  NA 137 137 137 136 139 139 139 140  K 3.9 3.5 3.2* 3.6 3.3* 3.9 4.2 4.5  CL 104 104 104 105 108 107 107 109  CO2 22 21* 22 22 20* $Remo'22 23 22  'ZgsBS$ GLUCOSE 121* 123* 125* 126* 143* 156* 141* 131*  BUN 27* 28* 31* 34* 38* 41* 38* 52*  CREATININE 1.77* 1.76* 1.93* 1.93* 2.15* 2.17* 2.19* 2.77*  CALCIUM 9.0 9.2 9.3 8.4* 7.5* 7.6* 7.7* 7.3*  MG 2.2 2.2 2.1  --  2.4  --  2.6*  --   PHOS 2.6 2.4* 2.6 1.7* 1.5* 2.0* 2.9 3.2     CBC: Recent Labs  Lab 06/25/21 0320 06/26/21 0316 06/27/21 0357 06/28/21 0335 06/29/21 0330  WBC 16.3* 15.1* 14.1* 12.6* 13.9*  HGB 14.0 13.9 14.0 13.8 13.9  HCT 42.1 40.5 41.6 40.8 41.7  MCV 114.1* 111.3* 110.6* 111.5* 112.1*  PLT 107* 85* 87* 62* 62*      Urinalysis: No  results for input(s): COLORURINE, LABSPEC, PHURINE, GLUCOSEU, HGBUR, BILIRUBINUR, KETONESUR, PROTEINUR, UROBILINOGEN, NITRITE, LEUKOCYTESUR in the last 72 hours.  Invalid input(s): APPERANCEUR    Imaging: DG Chest Port 1 View  Result Date: 06/28/2021 CLINICAL DATA:  Acute respiratory failure with hypoxia. EXAM: PORTABLE CHEST 1 VIEW COMPARISON:  Jun 25, 2021. FINDINGS: The heart size and mediastinal contours are within normal limits. Endotracheal nasogastric tubes are unchanged in position. Bilateral internal jugular catheters are unchanged. Stable bilateral lung opacities are noted concerning for pneumonia. The visualized skeletal structures are unremarkable. IMPRESSION: Stable support apparatus. Stable bilateral lung opacities as described above. Electronically Signed   By: Marijo Conception M.D.   On: 06/28/2021 07:51     Medications:     prismasol BGK 4/2.5 500 mL/hr at 06/27/21 2153    prismasol BGK 4/2.5 500 mL/hr at 06/27/21 2152   sodium chloride Stopped (06/28/21 2342)   sodium chloride Stopped (06/26/21 1129)   dexmedetomidine (PRECEDEX) IV infusion Stopped (06/26/21 1909)   feeding supplement (VITAL AF 1.2 CAL) 1,000 mL (06/29/21 0328)   fentaNYL infusion INTRAVENOUS Stopped (06/26/21 0819)   piperacillin-tazobactam (ZOSYN)  IV     prismasol BGK 4/2.5 2,500 mL/hr at 06/28/21 2030    artificial  tears   Both Eyes Q8H   chlorhexidine gluconate (MEDLINE KIT)  15 mL Mouth Rinse BID   Chlorhexidine Gluconate Cloth  6 each Topical Q0600   feeding supplement (PROSource TF)  90 mL Per Tube QID   folic acid  1 mg Intravenous Daily   free water  30 mL Per Tube Q4H   heparin injection (subcutaneous)  5,000 Units Subcutaneous Q12H   lactulose  20 g Per Tube TID   mouth rinse  15 mL Mouth Rinse 10 times per day   multivitamin with minerals  1 tablet Per Tube Daily   pantoprazole sodium  40 mg Per Tube Daily   rifaximin  550 mg Per Tube BID   thiamine injection  100 mg Intravenous Q24H     Assessment/ Plan:     Principal Problem:   Acute metabolic encephalopathy Active Problems:   Acute kidney injury ([place])   Hyponatremia   Alcohol withdrawal ([place])   Transaminitis   51 y.o.  male with past medical history including alcohol abuse, chronic back pain, and peripheral neuropathy, who was admitted to [facility] on 06/17/2021 for Hyponatremia, Elevated liver function tests. Generalized weakness, AKI (acute kidney injury) and Acute metabolic encephalopathy.    #1: Acute kidney injury due to ETOH abuse, poor nutrition and severe illness.    CRRT stopped. Minimal UOP remains. Appears patient will need dialysis at this time. May schedule patient for hemodialysis tomorrow, will monitor elevated heart rate.    #2: Hypotension/shock: Remains off Levo.  BP stable 127/81   #3: Hyponatremia: Corrected with hypertonic solution   #4: Acute respiratory failure with possible pneumonia: Remains intubated receiving antibiotics  #5: Transaminitis secondary to liver failure/shock liver: Most likely secondary to EtOH abuse.  Continue supportive care   LOS: [facility] 5/25/202311:53 AM

## 2021-06-29 NOTE — Progress Notes (Signed)
EEG complete - results pending.    Skin is very sensitive.difficulty with scrubbing

## 2021-06-29 NOTE — Progress Notes (Signed)
Nutrition Follow-up  DOCUMENTATION CODES:   Obesity unspecified  INTERVENTION:   Continue TF via OGT:   Vital AF 1.2 @ 60 ml/hr    90 ml Prosource TF QID.     30 ml free water flush every 4 hours to maintain tube patency   Tube feeding regimen provides 2048 kcal (100% of needs), 196 grams of protein, and 1170 ml of H2O.  Total free water: 1350 ml daily  NUTRITION DIAGNOSIS:   Inadequate oral intake related to inability to eat (pt sedated and ventilated) as evidenced by NPO status.  Ongoing  GOAL:   Provide needs based on ASPEN/SCCM guidelines  Met with TF  MONITOR:   Vent status, Labs, Weight trends, TF tolerance, Skin, I & O's  REASON FOR ASSESSMENT:   Ventilator    ASSESSMENT:   51 y/o male with h/o etoh abuse, OSA and DDD who is admitted with alcohol withdrawal, encephalopathy, AKI and aspiration requiring intubation.  5/18- intubated 5/19- CRRT initiated, TF held secondary to pressor requirements 5/22- TF re-started 5/24- d/c CRRT 5/25- transition to iHD  Patient is currently intubated on ventilator support MV: 17.4 L/min Temp (24hrs), Avg:100 F (37.8 C), Min:97.2 F (36.2 C), Max:102.7 F (39.3 C)  Reviewed I/O's: -696 ml x 24 hours and -809 ml since admission  UOP: 95 ml x 24 hours  Rectal tube output: 1 L x 24 hours  Case discussed with RN, MD,and during ICU rounds. Pt with slight spontaneous cough; does not open eyes, but raises his eyebrows. Plan for MRI, repeat tracheal aspirate, UA and d/c central line today. Pt with increased temperature, being treated with tylenol. Pt will start HD today.  Pt still with minimal UOP (about 45 ml per shift). Pt with good rectal tube output (about 700 ml/ shift). Lactulose has been decreased.   Per RN, pt tolerating TF at goal rate without issue.   Medications reviewed and include lactulose and thiamine.   Labs reviewed: CBGS: 126-141 (inpatient orders for glycemic control are none).    Diet Order:    Diet Order             Diet NPO time specified  Diet effective now                   EDUCATION NEEDS:   No education needs have been identified at this time  Skin:  Skin Assessment: Skin Integrity Issues: Skin Integrity Issues:: DTI DTI: lt buttocks  Last BM:  06/26/21 (via rectal tube)  Height:   Ht Readings from Last 1 Encounters:  06/21/2021 $RemoveB'6\' 3"'pQbezFQo$  (1.905 m)    Weight:   Wt Readings from Last 1 Encounters:  06/29/21 (!) 148.4 kg    Ideal Body Weight:  89 kg  BMI:  Body mass index is 40.89 kg/m.  Estimated Nutritional Needs:   Kcal:  5790-3833  Protein:  175-220 grams  Fluid:  1000 ml + UOP    Loistine Chance, RD, LDN, [place] Registered Dietitian II Certified Diabetes Care and Education Specialist Please refer to [facility] for RD and/or RD on-call/weekend/after hours pager

## 2021-06-29 NOTE — Consult Note (Signed)
[place] for Electrolyte Monitoring and Replacement   Recent Labs: Potassium (mmol/L)  Date Value  06/29/2021 4.5   Magnesium (mg/dL)  Date Value  06/28/2021 2.6 (H)   Calcium (mg/dL)  Date Value  06/29/2021 7.3 (L)   Albumin (g/dL)  Date Value  06/29/2021 1.9 (L)   Phosphorus (mg/dL)  Date Value  06/29/2021 3.2   Sodium (mmol/L)  Date Value  06/29/2021 140   Assessment: Patient is a 51 y/o M with medical history including obesity, chronic back pain, EtOH use disorder who presented to the ED 5/16 with weakness in setting of abdominal cramps, nausea, vomiting, and diarrhea. Patient subsequently admitted for severe symptomatic hyponatremia. Patient decompensated on 5/18 with acute onset respiratory failure requiring intubation. Patient remains admitted to the ICU where he is intubated, sedated, and on mechanical ventilation. There is further concern for septic shock and patient is requiring multiple vasopressor infusions. Admission further complicated by acute renal failure necessitating initiation of CRRT on 5/19. Pharmacy consulted to assist with electrolyte monitoring and replacement as indicated.  Nutrition: Tube feeds initiated 5/22 Cathartics: Lactulose 20 g TID for hepatic encephalopathy  CRRT discontinued 5/24 evening per nephrology. Anticipate transition to iHD  Goal of Therapy:  Electrolytes within normal limits  Plan:  --Discontinue Phos-Nak supplement --Follow-up electrolytes with AM labs tomorrow  Benita Gutter 06/29/2021 9:18 AM

## 2021-06-29 NOTE — Progress Notes (Signed)
NAME:  Robert Coffey, MRN:  [id number], DOB:  1970-04-19, LOS: 9 ADMISSION DATE:  06/17/2021, CONSULTATION DATE:  06/22/2021 REFERRING MD:  Dr. Jimmye Norman, CHIEF COMPLAINT:  Acute Respiratory Distress, AMS   Brief Pt Description / Synopsis:  51 year old male admitted with acute metabolic encephalopathy secondary to alcohol withdrawal and severe hyponatremia requiring hypertonic saline.  On 5/18 developed acute hypoxic & hypercapnic respiratory failure requiring intubation and mechanical ventilation.  History of Present Illness:  Robert Coffey is a 51 year old male with a past medical history significant for alcohol abuse, obesity, chronic back pain, peripheral neuropathy who presented to [facility] ED on 07/04/2021 due to altered mental status, poor p.o. intake, progressive weakness with difficulty getting around at home.  Patient is now intubated and unable to contribute to history, therefore history is obtained from patient's wife at bedside and chart review.  Per the patient's wife, the patient has not been feeling well with poor p.o. intake for approximately a month, however has continued to drink alcohol daily (usually 3-4 drinks of liquor) during that time.  Over the past several days he has become progressively weak with difficulty getting around at home (not even in bed able to get out of bed), intermittent confusion, and diffuse abdominal cramping/nausea/vomiting/diarrhea.  Denies any history of falls, chest pain, shortness of breath, fever, cough.  Last known alcoholic drink was the day prior to admission on Sunday.  ED Course: Initial Vital Signs: Temperature 98.1 F orally, blood pressure 136/119, respiratory rate 18, pulse 106, SPO2 95% Significant Labs: Sodium 109, chloride 69, glucose 108, BUN 8, creatinine 1.62, anion gap 17, albumin 3.1, lipase 86, AST 256, ALT 119, total bilirubin 5.0, high-sensitivity troponin 67, serum osmolality 236, urine osmolality 437, urine sodium less than 10 WBC  11.0, platelets 129, TSH 2.1 COVID-19 and influenza PCR negative Urinalysis negative for UTI Urine drug screen positive for tricyclics Ethyl alcohol less than 10 Imaging CT head without contrast>>IMPRESSION: 1. Fluid or debris in the left sinus tympani/middle ear, cannot exclude otitis media. There small bilateral mastoid effusions and chronic bilateral maxillary sinusitis. 2. Advanced for age cerebral atrophy. 3. Atherosclerosis. CT abdomen and pelvis>>IMPRESSION: Hepatic steatosis. 2.6 cm enhancing left adrenal nodule is noted. When the patient is clinically stable and able to follow directions and hold their breath (preferably as an outpatient) further evaluation with dedicated abdominal MRI should be considered. Medications Administered: 1 L normal saline bolus, hypertonic saline infusion  He was admitted by the hospitalist for further work-up and treatment of acute metabolic encephalopathy in the setting of DTs and severe hyponatremia.  Please see "significant hospital events" section below for full detailed hospital course.  Pertinent  Medical History  Alcohol abuse Obesity Chronic back  Micro Data:  5/16: SARS-CoV-2 and influenza PCR>> negative 5/16: HIV screen>> nonreactive 5/16: MRSA PCR>> negative 5/18: Tracheal aspirate>>normal respiratory flora 5/19: Blood x2>>negative 5/25: Tracheal aspirate>>  Antimicrobials:  5/18: Unasyn>>x2 doses, restarted 5/22>>5/24 5/18: Cefepime>>5/19 5/18: Vancomycin>>5/19 5/18: Flagyl>>5/21 5/25: [facility] Events: Including procedures, antibiotic start and stop dates in addition to other pertinent events   5/16: Admitted by hospitalist for acute metabolic encephalopathy in setting of severe hyponatremia and DTs.  Requiring hypertonic saline 5/18: Developed acute hypoxic respiratory failure, possible concern for aspiration.  Required intubation and mechanical ventilation. 5/19: Pt with worsening acute renal failure  with severe metabolic and lactic acidosis requiring CRRT.  Severely hypotensive requiring epinephrine, levophed, and vasopressin gtts to maintain map >65  5/20: Remains critically ill, on CRRT. Vent  support slightly weaned to 60% FiO2 & 10 PEEP. Some improvement in vasopressor requirements (off of epinephrine and phenylephrine) 5/21: Overnight weaned off of Giapreza, currently only on norepinephrine and vasopressin 5/22: No acute events overnight on minimal ventilator settings FiO2 30% PEEP 8.  Remains on levophed gtt to maintain map >65.  Will perform WUA  5/22: CT Head revealed No acute intracranial findings. No        change from CT 07/01/2021. 5/24: Transitioned off CRRT 5/25: Neuro exam remains poor off sedation, plan for MRI Brain.  New fever, obtain UA/TA/RUQ [place] & Doppler LE, line holiday. Empiric Zosyn started.  Interim History / Subjective:  -No significant events overnight -New fever overnight (T max 101.5) with slight worsening of Leukocytosis to 13.9 K (12.6) ~ discussed with Dr. Mortimer Fries, will obtain UA, TA, doppler LE, & RUQ [place], along with line holiday (remove left IJ, discuss with Nephrology regarding trialysis) -Hemodynamically stable, remains off vasopressors -Minimal ventilator settings FiO2 30% PEEP 8.  -Opens eyes to verbal stimulation, however unable to follow commands and not withdrawing from pain ~ plan for MRI Brain today  -Transitioned off CRRT yesterday, Creatinine increased to 2.77 (2.19), UOP 95 cc last 24 hrs (net - 800 cc)   Objective   Blood pressure 129/85, pulse (!) 120, temperature (!) 100.8 F (38.2 C), resp. rate (!) 23, height $RemoveBe'6\' 3"'eFGDOxaXA$  (1.905 m), weight (!) 148.4 kg, SpO2 96 %.    Vent Mode: PRVC FiO2 (%):  [24 %-30 %] 30 % Set Rate:  [22 bmp] 22 bmp Vt Set:  [650 mL] 650 mL PEEP:  [5 cmH20] 5 cmH20 Pressure Support:  [10 cmH20] 10 cmH20 Plateau Pressure:  [18 cmH20-24 cmH20] 24 cmH20   Intake/Output Summary (Last 24 hours) at 06/29/2021 0732 Last data filed  at 06/29/2021 0400 Gross per 24 hour  Intake 1456.78 ml  Output 2153.2 ml  Net -696.42 ml    Filed Weights   06/27/21 0413 06/28/21 0331 06/29/21 0300  Weight: (!) 150.7 kg (!) 151 kg (!) 148.4 kg   Examination: General: Critically ill-appearing obese male, laying in bed, NAD mechanically intubated  HENT: Atraumatic, normocephalic, neck supple, difficult to assess JVD due to body habitus Lungs: Faint rhonchi throughout, even, non labored  Cardiovascular: Tachycardia, Regular rhythm,  no murmurs, rubs, gallops, 1+ generalized edema  Abdomen: +BS x4, obese, soft, non distended  Extremities: Normal bulk and tone Neuro: Off sedation, opens eyes to verbal stimulation, not following commands, PERRL, scleredema  GU: Indwelling foley catheter draining yellow urine   Imaging     Assessment & Plan:   Acute hypoxic & hypercapnic respiratory failure in the setting of suspected aspiration in the setting of severe DTs Pulmonary Embolism - cannot r/o -Full vent support, on lung protective strategies -Plateau pressures less than 30 cm H20 -Wean FiO2 & PEEP as tolerated to maintain O2 sats >92% -Follow intermittent Chest X-ray & ABG as needed -Spontaneous Breathing Trials when respiratory parameters met and mental status permits -Continue VAP Bundle -PRN Bronchodilators -Venous US Bilateral LE negative for DVT  Septic shock~RESOLVED Elevated troponin suspect secondary to demand ischemia -Continuous cardiac monitoring -Maintain MAP >65 -Vasopressors as needed to maintain MAP goal -D/c Midodrine 5/25 -HS Troponin peaked at 67 -Echocardiogram 06/23/21: LVEF 60-65%, Grade I DD, normal RV systolic function  Severe Sepsis due to Suspected aspiration~TREATED CT head 5/16 unable to exclude left otitis media and chronic bilateral maxillary sinusitis New Fever 5/25 -Monitor fever curve -Trend WBC's & Procalcitonin -Follow cultures  as above -Completed course of Unasyn ~ start empiric Zosyn for  now -Discussed with Dr. Mortimer Fries, will obtain UA, TA, doppler LE, & RUQ [place], along with line holiday (remove left IJ, discuss with Nephrology regarding ability to remove trialysis)  Hypotonic hyponatremia, suspect secondary to dehydration & poor p.o. intake~RESOLVED Acute kidney injury secondary to ATN with severe metabolic acidosis  Lactic acidosis persistent likely due to hepatic impairment -Trend BMP, lactic acid, and ABG -Persistent lactic acid elevation likely due to hepatic dysfunction -Replace electrolytes as indicated  -Monitor UOP -Nephrology following, appreciate input: Transitioned off CRRT to intermittent HD 5/24  Transaminitis secondary to hepatic steatosis & ETOH abuse & Shock liver -Trend LFTs and coags -AFP tumor marker normal  Thrombocytopenia, suspect secondary to alcohol abuse -Trend CBC -Monitor for s/sx of bleeding and transfuse for Hgb <7 -Transfuse platelets for platelet count less than 50 with active bleeding  Incidental finding of left adrenal nodule via CT Abd/Pelvis  -Will need abdominal MRI for further evaluation in the outpatient setting   Acute metabolic encephalopathy in the setting of severe DTs and severe hyponatremia Sedation needs in the setting of mechanical ventilation -Maintain a RASS goal of 0 to -1 -Fentanyl and versed as needed to maintain RASS goal -Avoid sedating medications as able -Daily wake up assessment -Continue thiamine  -Continue folic acid and multivitamin -Continue CIWA protocol -Continue lactulose and rifaximin trend ammonia level  -Obtain MRI Brain 5/25   Morbid obesity with sarcopenia Protein malnutrition -Calorie excess, protein deficiency in the setting of alcoholism -Dietitian consulted for initiation of TF's  Best Practice (right click and "Reselect all SmartList Selections" daily)  Diet/type: TF's DVT prophylaxis: Subq heparin  GI prophylaxis: PPI Lines: Central line, will remove 5/25 Foley:  Yes, and it is still  needed Code Status: DNR  Last date of multidisciplinary goals of care discussion [06/29/21]  Update pts wife 5/25 at bedside.  All questions answered to her satisfaction.  Labs   CBC: Recent Labs  Lab 06/25/21 0320 06/26/21 0316 06/27/21 0357 06/28/21 0335 06/29/21 0330  WBC 16.3* 15.1* 14.1* 12.6* 13.9*  HGB 14.0 13.9 14.0 13.8 13.9  HCT 42.1 40.5 41.6 40.8 41.7  MCV 114.1* 111.3* 110.6* 111.5* 112.1*  PLT 107* 85* 87* 62* 62*     Basic Metabolic Panel: Recent Labs  Lab 06/25/21 1603 06/26/21 0316 06/26/21 1425 06/27/21 0357 06/27/21 1631 06/28/21 0335 06/28/21 1524 06/29/21 0330  NA 137 137 137 136 139 139 139 140  K 3.9 3.5 3.2* 3.6 3.3* 3.9 4.2 4.5  CL 104 104 104 105 108 107 107 109  CO2 22 21* 22 22 20* _0 GLUCOSE 121* 123* 125* 126* 143* 156* 141* 131*  BUN 27* 28* 31* 34* 38* 41* 38* 52*  CREATININE 1.77* 1.76* 1.93* 1.93* 2.15* 2.17* 2.19* 2.77*  CALCIUM 9.0 9.2 9.3 8.4* 7.5* 7.6* 7.7* 7.3*  MG 2.2 2.2 2.1  --  2.4  --  2.6*  --   PHOS 2.6 2.4* 2.6 1.7* 1.5* 2.0* 2.9 3.2    GFR: Estimated Creatinine Clearance: 50.8 mL/min (A) (by C-G formula based on SCr of 2.77 mg/dL (H)). Recent Labs  Lab 06/23/21 0717 06/23/21 1140 06/24/21 8934 06/24/21 0684 06/25/21 0320 06/25/21 0332 06/26/21 0316 06/27/21 0357 06/27/21 1001 06/27/21 2219 06/28/21 0335 06/29/21 0330  PROCALCITON 16.69  --  7.12  --  5.62  --   --   --   --   --   --   --  WBC  --   --  17.4*  --  16.3*  --  15.1* 14.1*  --   --  12.6* 13.9*  LATICACIDVEN  --    < >  --    < >  --  4.1* 3.3*  --  3.3* 3.0*  --   --    < > = values in this interval not displayed.     Liver Function Tests: Recent Labs  Lab 06/23/21 0425 06/23/21 1519 06/24/21 0339 06/24/21 1406 06/25/21 0320 06/25/21 0332 06/26/21 0332 06/26/21 1425 06/27/21 0357 06/27/21 1631 06/28/21 0335 06/28/21 1524 06/29/21 0330  AST 752*  --  1,148*  --  1,000*  --  665*  --   --   --   --   --   --   ALT  175*  --  280*  --  289*  --  251*  --   --   --   --   --   --   ALKPHOS 107  --  138*  --  125  --  110  --   --   --   --   --   --   BILITOT 6.3*  --  8.0*  --  7.9*  --  7.2*  --   --   --   --   --   --   PROT 5.9*  --  6.0*  --  5.8*  --  5.5*  --   --   --   --   --   --   ALBUMIN 2.5*   < > 2.4*   < > 2.3*   < > 2.1*   < > 2.0* 1.8* 1.9* 2.1* 1.9*   < > = values in this interval not displayed.    No results for input(s): LIPASE, AMYLASE in the last 168 hours.  Recent Labs  Lab 06/22/21 1531 06/23/21 1140 06/25/21 0332 06/27/21 0357  AMMONIA 96* 40* 85* 59*     ABG    Component Value Date/Time   PHART 7.39 06/25/2021 0508   PCO2ART 36 06/25/2021 0508   PO2ART 99 06/25/2021 0508   HCO3 21.8 06/25/2021 0508   ACIDBASEDEF 2.6 (H) 06/25/2021 0508   O2SAT 99.5 06/25/2021 0508      Coagulation Profile: Recent Labs  Lab 06/22/21 2010 06/23/21 0425  INR 1.5* 2.0*     Cardiac Enzymes: No results for input(s): CKTOTAL, CKMB, CKMBINDEX, TROPONINI in the last 168 hours.  HbA1C: No results found for: HGBA1C  CBG: Recent Labs  Lab 06/28/21 1626 06/28/21 1909 06/28/21 2349 06/29/21 0323 06/29/21 0730  GLUCAP 150* 158* 147* 118* 138*     Review of Systems:   Unable to assess due to intubation/sedation/critical illness  Allergies Allergies  Allergen Reactions   Sulfa Antibiotics      Home Medications  Prior to Admission medications   Medication Sig Start Date End Date Taking? Authorizing Provider  atorvastatin (LIPITOR) 20 MG tablet Take 20 mg by mouth daily. 02/03/21  Yes [provider]  cyanocobalamin 1000 MCG tablet Take by mouth.   Yes [provider]  hydrochlorothiazide (HYDRODIURIL) 25 MG tablet Take 25 mg by mouth daily. 05/30/21  Yes [provider]  lisinopril (ZESTRIL) 40 MG tablet Take 40 mg by mouth daily. 05/04/21  Yes [provider]  rOPINIRole (REQUIP) 0.25 MG tablet Take 0.25 mg by mouth 3 (three)  times daily. 07/28/20  Yes [provider]  vitamin C (  ASCORBIC ACID) 500 MG tablet Take 500 mg by mouth daily.   Yes [provider]  zolpidem (AMBIEN) 10 MG tablet Take 10 mg by mouth at bedtime as needed. 06/07/21  Yes [provider]     Scheduled Meds:  artificial tears   Both Eyes Q8H   chlorhexidine gluconate (MEDLINE KIT)  15 mL Mouth Rinse BID   Chlorhexidine Gluconate Cloth  6 each Topical Q0600   feeding supplement (PROSource TF)  90 mL Per Tube QID   folic acid  1 mg Intravenous Daily   free water  30 mL Per Tube Q4H   heparin injection (subcutaneous)  5,000 Units Subcutaneous Q12H   lactulose  20 g Per Tube TID   mouth rinse  15 mL Mouth Rinse 10 times per day   midodrine  10 mg Per Tube TID WC   multivitamin with minerals  1 tablet Per Tube Daily   pantoprazole sodium  40 mg Per Tube Daily   rifaximin  550 mg Per Tube BID   thiamine injection  100 mg Intravenous Q24H   Continuous Infusions:   prismasol BGK 4/2.5 500 mL/hr at 06/27/21 2153    prismasol BGK 4/2.5 500 mL/hr at 06/27/21 2152   sodium chloride Stopped (06/28/21 2342)   sodium chloride Stopped (06/26/21 1129)   dexmedetomidine (PRECEDEX) IV infusion Stopped (06/26/21 1909)   feeding supplement (VITAL AF 1.2 CAL) 1,000 mL (06/29/21 0328)   fentaNYL infusion INTRAVENOUS Stopped (06/26/21 0819)   norepinephrine (LEVOPHED) Adult infusion Stopped (06/27/21 1005)   prismasol BGK 4/2.5 2,500 mL/hr at 06/28/21 2030   PRN Meds:.sodium chloride, acetaminophen **OR** acetaminophen, fentaNYL, heparin, labetalol, ondansetron **OR** ondansetron (ZOFRAN) IV   Critical care time: 40 minutes    Darel Hong, AGACNP-BC [facility] Prefer epic messenger for cross cover needs If after hours, please call E-link

## 2021-06-29 NOTE — IPAL (Signed)
  Interdisciplinary Goals of Care Family Meeting   Date carried out: 06/29/2021  Location of the meeting: Bedside  Member's involved: Physician and Family Member or next of kin      GOALS OF CARE DISCUSSION  The Clinical status was relayed to family in detail-Wife at bedside  Updated and notified of patients medical condition- Patient remains unresponsive and will not open eyes to command.   Patient is having a weak cough and struggling to remove secretions.   Patient with increased WOB and using accessory muscles to breathe Explained to family course of therapy and the modalities   Patient with Progressive multiorgan failure with a very high probablity of a very minimal chance of meaningful recovery despite all aggressive and optimal medical therapy.   Patient Remains DNR status  Family understands the situation.  Family are satisfied with Plan of action and management. All questions answered  Additional CC time 25 mins   Jamas Jaquay Patricia Pesa, M.D.  [facility]  Medical Director [place] Director [facility] Cardio-Pulmonary Department

## 2021-06-29 NOTE — Consult Note (Addendum)
NAME: Robert Coffey  DOB: 1970/07/08  MRN: [id number]  Date/Time: 06/29/2021 5:47 PM  REQUESTING PROVIDER: Dr. Mortimer Fries Subjective:  REASON FOR CONSULT: Fever ? Robert Coffey is a 51 y.o. male with a history of AUD admitted on 06/08/2021 with acute metabolic encephalopathy secondary to alcohol withdrawal, hyponatremia On 5/18 intubated for acute hypoxia and hypercapnia Ventilated HD cath 06/23/21 RT IJ.  Started on CRRT for worsening kidney function Foley  06/22/21 Patient initially received cefepime between 518 03/12/2017 Vancomycin 06/22/2017 Flagyl 518 03/13/2019 Unasyn 518 and stopped after 2 doses and then restarted on 06/26/2021 until 06/28/2021 Zosyn 06/29/2021  Has been off pressors since 06/27/2021. 06/24/2021 CT head revealed no acute intracranial findings 06/29/2021 patient had spiked a fever and asked to see the patient.    Social History   Socioeconomic History   Marital status: Married    Spouse name: Not on file   Number of children: Not on file   Years of education: Not on file   Highest education level: Not on file  Occupational History   Not on file  Tobacco Use   Smoking status: Not on file   Smokeless tobacco: Not on file  Substance and Sexual Activity   Alcohol use: Not on file   Drug use: Not on file   Sexual activity: Not on file  Other Topics Concern   Not on file  Social History Narrative   Not on file   Social Determinants of Health   Financial Resource Strain: Not on file  Food Insecurity: Not on file  Transportation Needs: Not on file  Physical Activity: Not on file  Stress: Not on file  Social Connections: Not on file  Intimate Partner Violence: Not on file    History reviewed. No pertinent family history. Allergies  Allergen Reactions   Sulfa Antibiotics    I? Current Facility-Administered Medications  Medication Dose Route Frequency Provider Last Rate Last Admin    prismasol BGK 4/2.5 infusion   CRRT Continuous Lateef, Munsoor, MD 500 mL/hr  at 06/27/21 2153 New Bag at 06/27/21 2153    prismasol BGK 4/2.5 infusion   CRRT Continuous Lateef, Munsoor, MD 500 mL/hr at 06/27/21 2152 New Bag at 06/27/21 2152   0.9 %  sodium chloride infusion  250 mL Intravenous Continuous Wyvonnia Dusky, MD   Stopped at 06/28/21 2342   0.9 %  sodium chloride infusion   Intravenous PRN Tyler Pita, MD   Stopped at 06/26/21 1129   acetaminophen (TYLENOL) tablet 650 mg  650 mg Per Tube Q6H PRN Tyler Pita, MD   650 mg at 06/29/21 1914   Or   acetaminophen (TYLENOL) suppository 650 mg  650 mg Rectal Q6H PRN Tyler Pita, MD       artificial tears (LACRILUBE) ophthalmic ointment   Both Eyes Q8H Tonye Royalty, NP   1 application. at 06/29/21 1444   chlorhexidine gluconate (MEDLINE KIT) (PERIDEX) 0.12 % solution 15 mL  15 mL Mouth Rinse BID Teressa Lower, NP   15 mL at 06/29/21 0936   Chlorhexidine Gluconate Cloth 2 % PADS 6 each  6 each Topical Q0600 Agbata, Tochukwu, MD   6 each at 06/29/21 0936   dexmedetomidine (PRECEDEX) 400 MCG/100ML (4 mcg/mL) infusion  0.4-1.2 mcg/kg/hr Intravenous Titrated Flora Lipps, MD   Stopped at 06/26/21 1909   feeding supplement (PROSource TF) liquid 90 mL  90 mL Per Tube QID Flora Lipps, MD   90 mL at 06/29/21 1444   feeding supplement (  VITAL AF 1.2 CAL) liquid 1,000 mL  1,000 mL Per Tube Continuous Flora Lipps, MD 60 mL/hr at 06/29/21 0328 1,000 mL at 06/29/21 0328   fentaNYL (SUBLIMAZE) bolus via infusion 50-100 mcg  50-100 mcg Intravenous Q2H PRN Rust-Chester, Britton L, NP   50 mcg at 06/27/21 0135   fentaNYL 2515mg in NS 2515m(1034mml) infusion-PREMIX  0-200 mcg/hr Intravenous Continuous Rust-Chester, BriHuel CoteP   Stopped at 07/04/50/8411324folic acid injection 1 mg  1 mg Intravenous Daily GruDallie PilesPH   1 mg at 06/29/21 1115   free water 30 mL  30 mL Per Tube Q4H KasFlora LippsD   30 mL at 06/29/21 1212   heparin injection 1,000-6,000 Units  1,000-6,000 Units CRRT PRN LatHolley RaringMunsoor, MD   2,600 Units at 06/28/21 2324   heparin injection 5,000 Units  5,000 Units Subcutaneous Q12H KasFlora LippsD   5,000 Units at 06/29/21 0938   labetalol (NORMODYNE) injection 10 mg  10 mg Intravenous Q2H PRN JamTonye RoyaltyP   10 mg at 06/29/21 0221   lactulose (CHRONULAC) 10 GM/15ML solution 20 g  20 g Per Tube TID KasFlora LippsD   20 g at 06/29/21 0934010MEDLINE mouth rinse  15 mL Mouth Rinse 10 times per day NelTeressa LowerP   15 mL at 06/29/21 1444   multivitamin with minerals tablet 1 tablet  1 tablet Per Tube Daily GonTyler PitaD   1 tablet at 06/29/21 0939   ondansetron (ZOFRAN) tablet 4 mg  4 mg Per Tube Q6H PRN GonTyler PitaD       Or   ondansetron (ZOMidwest Eye Surgery Center LLCnjection 4 mg  4 mg Intravenous Q6H PRN GonTyler PitaD       pantoprazole sodium (PROTONIX) 40 mg/20 mL oral suspension 40 mg  40 mg Per Tube Daily KeeDarel Hong NP   40 mg at 06/29/21 0939   piperacillin-tazobactam (ZOSYN) IVPB 2.25 g  2.25 g Intravenous Q8H Kasa, KurMaretta BeesD 100 mL/hr at 06/29/21 1447 2.25 g at 06/29/21 1447   prismasol BGK 4/2.5 infusion   CRRT Continuous Lateef, Munsoor, MD 2,500 mL/hr at 06/28/21 2030 New Bag at 06/28/21 2030   rifaximin (XIFAXAN) tablet 550 mg  550 mg Per Tube BID KasFlora LippsD   550 mg at 06/29/21 0939   thiamine (B-1) injection 100 mg  100 mg Intravenous Q24H KeeDarel Hong NP   100 mg at 06/29/21 0503     Abtx:  Anti-infectives (From admission, onward)    Start     Dose/Rate Route Frequency Ordered Stop   06/29/21 1400  piperacillin-tazobactam (ZOSYN) IVPB 2.25 g        2.25 g 100 mL/hr over 30 Minutes Intravenous Every 8 hours 06/29/21 1011     06/27/21 0000  Ampicillin-Sulbactam (UNASYN) 3 g in sodium chloride 0.9 % 100 mL IVPB        3 g 200 mL/hr over 30 Minutes Intravenous Every 8 hours 06/26/21 1054 06/28/21 1558   06/26/21 1115  rifaximin (XIFAXAN) tablet 550 mg        550 mg Per Tube 2 times daily 06/26/21 1017      06/25/21 2200  vancomycin (VANCOREADY) IVPB 1750 mg/350 mL  Status:  Discontinued        1,750 mg 175 mL/hr over 120 Minutes Intravenous Every 24 hours 06/25/21 0805 06/25/21 1507   06/25/21 1800  piperacillin-tazobactam (ZOSYN) IVPB 3.375  g  Status:  Discontinued        3.375 g 12.5 mL/hr over 240 Minutes Intravenous Every 6 hours 06/25/21 1507 06/25/21 1511   06/25/21 1800  piperacillin-tazobactam (ZOSYN) IVPB 3.375 g        3.375 g 100 mL/hr over 30 Minutes Intravenous Every 6 hours 06/25/21 1511 06/26/21 1910   06/23/21 2200  vancomycin (VANCOREADY) IVPB 1750 mg/350 mL  Status:  Discontinued        1,750 mg 175 mL/hr over 120 Minutes Intravenous Every 24 hours 06/22/21 2115 06/23/21 0812   06/23/21 2200  ceFEPIme (MAXIPIME) 2 g in sodium chloride 0.9 % 100 mL IVPB  Status:  Discontinued        2 g 200 mL/hr over 30 Minutes Intravenous Every 12 hours 06/23/21 0813 06/25/21 1507   06/23/21 2200  vancomycin (VANCOREADY) IVPB 1500 mg/300 mL  Status:  Discontinued        1,500 mg 150 mL/hr over 120 Minutes Intravenous Every 24 hours 06/23/21 1001 06/25/21 0805   06/23/21 0812  vancomycin variable dose per unstable renal function (pharmacist dosing)  Status:  Discontinued         Does not apply See admin instructions 06/23/21 0813 06/23/21 1001   06/22/21 2100  ceFEPIme (MAXIPIME) 2 g in sodium chloride 0.9 % 100 mL IVPB  Status:  Discontinued        2 g 200 mL/hr over 30 Minutes Intravenous Every 8 hours 06/22/21 1955 06/23/21 0813   06/22/21 2045  vancomycin (VANCOREADY) IVPB 2000 mg/400 mL        2,000 mg 200 mL/hr over 120 Minutes Intravenous  Once 06/22/21 1955 06/22/21 2255   06/22/21 2000  metroNIDAZOLE (FLAGYL) IVPB 500 mg  Status:  Discontinued        500 mg 100 mL/hr over 60 Minutes Intravenous Every 12 hours 06/22/21 1908 06/25/21 1507   06/22/21 1200  Ampicillin-Sulbactam (UNASYN) 3 g in sodium chloride 0.9 % 100 mL IVPB  Status:  Discontinued        3 g 200 mL/hr over 30  Minutes Intravenous Every 6 hours 06/22/21 1007 06/22/21 1908       REVIEW OF SYSTEMS:  NA Objective:  VITALS:  BP 132/81   Pulse (!) 120   Temp (!) 102.7 F (39.3 C) (Oral)   Resp (!) 26   Ht _0  (1.905 m)   Wt (!) 148.4 kg   SpO2 96%   BMI 40.89 kg/m   PHYSICAL EXAM:  General:intubated Head: Normocephalic, without obvious abnormality, atraumatic. Eyes: b/l chemosis- congested ENT cannot be examined Neck: symmetrical, no adenopathy, thyroid: non tender  Lungs: b/l air entry- decreased bases. Heart: tachycardia Abdomen: Soft, non-tender,not distended. Bowel sounds normal. No masses Extremities: atraumatic, no cyanosis. No edema. No clubbing Skin: No rashes or lesions. Or bruising Lymph: Cervical, supraclavicular normal. Neurologic: cannot be assessed Pertinent Labs Lab Results CBC    Component Value Date/Time   WBC 13.9 (H) 06/29/2021 0330   RBC 3.72 (L) 06/29/2021 0330   HGB 13.9 06/29/2021 0330   HCT 41.7 06/29/2021 0330   PLT 62 (L) 06/29/2021 0330   MCV 112.1 (H) 06/29/2021 0330   MCH 37.4 (H) 06/29/2021 0330   MCHC 33.3 06/29/2021 0330   RDW 19.9 (H) 06/29/2021 0330   LYMPHSABS 0.8 06/11/2021 0629   MONOABS 1.8 (H) 06/26/2021 0629   EOSABS 0.0 06/19/2021 0629   BASOSABS 0.1 06/05/2021 0629       Latest Ref Rng & Units  06/29/2021    3:30 AM 06/28/2021    3:24 PM 06/28/2021    3:35 AM  CMP  Glucose 70 - 99 mg/dL 131   141   156    BUN 6 - 20 mg/dL 52   38   41    Creatinine 0.61 - 1.24 mg/dL 2.77   2.19   2.17    Sodium 135 - 145 mmol/L 140   139   139    Potassium 3.5 - 5.1 mmol/L 4.5   4.2   3.9    Chloride 98 - 111 mmol/L 109   107   107    CO2 22 - 32 mmol/L _0 Calcium 8.9 - 10.3 mg/dL 7.3   7.7   7.6    Total Protein 6.5 - 8.1 g/dL 5.2      Total Bilirubin 0.3 - 1.2 mg/dL 7.2      Alkaline Phos 38 - 126 U/L 110      AST 15 - 41 U/L 486      ALT 0 - 44 U/L 229          Microbiology: Recent Results (from the past 240  hour(s))  Resp Panel by RT-PCR (Flu A&B, Covid) Nasopharyngeal Swab     Status: None   Collection Time: 07/01/2021  4:08 PM   Specimen: Nasopharyngeal Swab; Nasopharyngeal(NP) swabs in vial transport medium  Result Value Ref Range Status   SARS Coronavirus 2 by RT PCR NEGATIVE NEGATIVE Final    Comment: (NOTE) SARS-CoV-2 target nucleic acids are NOT DETECTED.  The SARS-CoV-2 RNA is generally detectable in upper respiratory specimens during the acute phase of infection. The lowest concentration of SARS-CoV-2 viral copies this assay can detect is 138 copies/mL. A negative result does not preclude SARS-Cov-2 infection and should not be used as the sole basis for treatment or other patient management decisions. A negative result may occur with  improper specimen collection/handling, submission of specimen other than nasopharyngeal swab, presence of viral mutation(s) within the areas targeted by this assay, and inadequate number of viral copies(<138 copies/mL). A negative result must be combined with clinical observations, patient history, and epidemiological information. The expected result is Negative.  Fact Sheet for Patients:  EntrepreneurPulse.com.au  Fact Sheet for Healthcare Providers:  IncredibleEmployment.be  This test is no t yet approved or cleared by the [place] FDA and  has been authorized for detection and/or diagnosis of SARS-CoV-2 by FDA under an Emergency Use Authorization (EUA). This EUA will remain  in effect (meaning this test can be used) for the duration of the COVID-19 declaration under Section 564(b)(1) of the Act, 21 U.S.C.section 360bbb-3(b)(1), unless the authorization is terminated  or revoked sooner.       Influenza A by PCR NEGATIVE NEGATIVE Final   Influenza B by PCR NEGATIVE NEGATIVE Final    Comment: (NOTE) The Xpert Xpress SARS-CoV-2/FLU/RSV plus assay is intended as an aid in the diagnosis of influenza from  Nasopharyngeal swab specimens and should not be used as a sole basis for treatment. Nasal washings and aspirates are unacceptable for Xpert Xpress SARS-CoV-2/FLU/RSV testing.  Fact Sheet for Patients: EntrepreneurPulse.com.au  Fact Sheet for Healthcare Providers: IncredibleEmployment.be  This test is not yet approved or cleared by the [place] FDA and has been authorized for detection and/or diagnosis of SARS-CoV-2 by FDA under an Emergency Use Authorization (EUA). This EUA will remain in effect (meaning this test can be used) for  the duration of the COVID-19 declaration under Section 564(b)(1) of the Act, 21 U.S.C. section 360bbb-3(b)(1), unless the authorization is terminated or revoked.  Performed at [facility], [address] [place], [place] [address]   MRSA Next Gen by PCR, Nasal     Status: None   Collection Time: 06/12/2021  8:23 PM   Specimen: Nasal Mucosa; Nasal Swab  Result Value Ref Range Status   MRSA by PCR Next Gen NOT DETECTED NOT DETECTED Final    Comment: (NOTE) The GeneXpert MRSA Assay (FDA approved for NASAL specimens only), is one component of a comprehensive MRSA colonization surveillance program. It is not intended to diagnose MRSA infection nor to guide or monitor treatment for MRSA infections. Test performance is not FDA approved in patients less than 55 years old. Performed at [facility], [address] [place], [place] [address]   Culture, Respiratory w Gram Stain     Status: None   Collection Time: 06/22/21  2:02 PM   Specimen: Tracheal Aspirate; Respiratory  Result Value Ref Range Status   Specimen Description   Final    TRACHEAL ASPIRATE Performed at [facility], [address], [place], [place] [address]    Special Requests   Final    NONE Performed at [facility], [address] [place], [place] [address]    Gram Stain   Final    MODERATE WBC PRESENT,  PREDOMINANTLY PMN MODERATE GRAM POSITIVE RODS RARE GRAM POSITIVE COCCI IN CLUSTERS RARE GRAM NEGATIVE RODS    Culture   Final    FEW Normal respiratory flora-no Staph aureus or Pseudomonas seen Performed at [facility], [address], [place], [place] [address]    Report Status 06/25/2021 FINAL  Final  Culture, blood (Routine X 2) w Reflex to ID Panel     Status: None   Collection Time: 06/23/21 11:40 AM   Specimen: Right Antecubital; Blood  Result Value Ref Range Status   Specimen Description RIGHT ANTECUBITAL  Final   Special Requests   Final    BOTTLES DRAWN AEROBIC AND ANAEROBIC Blood Culture adequate volume   Culture   Final    NO GROWTH 5 DAYS Performed at [facility], [address] [place], [place] [address]    Report Status 06/28/2021 FINAL  Final  Culture, blood (Routine X 2) w Reflex to ID Panel     Status: None   Collection Time: 06/23/21 11:40 AM   Specimen: BLOOD RIGHT HAND  Result Value Ref Range Status   Specimen Description BLOOD RIGHT HAND  Final   Special Requests   Final    BOTTLES DRAWN AEROBIC ONLY Blood Culture results may not be optimal due to an inadequate volume of blood received in culture bottles   Culture   Final    NO GROWTH 5 DAYS Performed at [facility], [address] [place], [place] [address]    Report Status 06/28/2021 FINAL  Final    IMAGING RESULTS:  I have personally reviewed the films ?b/l infiltrates  Impression/Recommendation Acute metabolic encephalopathy on 06/18/2021 admission presenting with sodium of 109.  It was gradually corrected over repeated a few days.  Currently there is 140  Alcohol use disorder with DTs  Acute hypoxia and hypercapnic respiratory failure--with aspiration.intubated   Aspiration pneumonia was treated with Unasyn. Now on zosyn  New onset fever but white count is stable The Central lines have been removed and Foley has been removed as well.  Will need to do blood  cultures If fever persists. Currently on zosyn  Liver failure- abnormal LFTS- shock liver, alcohol  ___________________________________________________ Discussed with requesting provider Note:  This document was prepared using Dragon voice recognition software and may include unintentional dictation errors.

## 2021-06-30 ENCOUNTER — Inpatient Hospital Stay: Payer: BC Managed Care – PPO

## 2021-06-30 ENCOUNTER — Other Ambulatory Visit: Payer: BC Managed Care – PPO

## 2021-06-30 DIAGNOSIS — F10931 Alcohol use, unspecified with withdrawal delirium: Secondary | ICD-10-CM | POA: Diagnosis not present

## 2021-06-30 DIAGNOSIS — N179 Acute kidney failure, unspecified: Secondary | ICD-10-CM | POA: Diagnosis not present

## 2021-06-30 DIAGNOSIS — G9341 Metabolic encephalopathy: Secondary | ICD-10-CM | POA: Diagnosis not present

## 2021-06-30 DIAGNOSIS — E871 Hypo-osmolality and hyponatremia: Secondary | ICD-10-CM | POA: Diagnosis not present

## 2021-06-30 DIAGNOSIS — R509 Fever, unspecified: Secondary | ICD-10-CM

## 2021-06-30 LAB — BASIC METABOLIC PANEL
Anion gap: 15 (ref 5–15)
BUN: 90 mg/dL — ABNORMAL HIGH (ref 6–20)
CO2: 20 mmol/L — ABNORMAL LOW (ref 22–32)
Calcium: 7.9 mg/dL — ABNORMAL LOW (ref 8.9–10.3)
Chloride: 107 mmol/L (ref 98–111)
Creatinine, Ser: 6.04 mg/dL — ABNORMAL HIGH (ref 0.61–1.24)
GFR, Estimated: 11 mL/min — ABNORMAL LOW (ref >60)
Glucose, Bld: 151 mg/dL — ABNORMAL HIGH (ref 70–99)
Potassium: 4.3 mmol/L (ref 3.5–5.1)
Sodium: 142 mmol/L (ref 135–145)

## 2021-06-30 LAB — GLUCOSE, CAPILLARY
Glucose-Capillary: 145 mg/dL — ABNORMAL HIGH (ref 70–99)
Glucose-Capillary: 134 mg/dL — ABNORMAL HIGH (ref 70–99)
Glucose-Capillary: 103 mg/dL — ABNORMAL HIGH (ref 70–99)
Glucose-Capillary: 108 mg/dL — ABNORMAL HIGH (ref 70–99)
Glucose-Capillary: 135 mg/dL — ABNORMAL HIGH (ref 70–99)
Glucose-Capillary: 115 mg/dL — ABNORMAL HIGH (ref 70–99)

## 2021-06-30 LAB — BLOOD GAS, ARTERIAL
Acid-base deficit: 2.9 mmol/L — ABNORMAL HIGH (ref 0.0–2.0)
Bicarbonate: 22.6 mmol/L (ref 20.0–28.0)
FIO2: 80 %
MECHVT: 650 mL
Mechanical Rate: 20
O2 Saturation: 99.1 %
PEEP: 10 cmH2O
Patient temperature: 37
pCO2 arterial: 41 mmHg (ref 32–48)
pH, Arterial: 7.35 (ref 7.35–7.45)
pO2, Arterial: 94 mmHg (ref 83–108)

## 2021-06-30 LAB — CBC
HCT: 44.6 % (ref 39.0–52.0)
Hemoglobin: 15.2 g/dL (ref 13.0–17.0)
MCH: 38.1 pg — ABNORMAL HIGH (ref 26.0–34.0)
MCHC: 34.1 g/dL (ref 30.0–36.0)
MCV: 111.8 fL — ABNORMAL HIGH (ref 80.0–100.0)
Platelets: 85 10*3/uL — ABNORMAL LOW (ref 150–400)
RBC: 3.99 MIL/uL — ABNORMAL LOW (ref 4.22–5.81)
RDW: 21.1 % — ABNORMAL HIGH (ref 11.5–15.5)
WBC: 17.2 10*3/uL — ABNORMAL HIGH (ref 4.0–10.5)
nRBC: 4.4 % — ABNORMAL HIGH (ref 0.0–0.2)

## 2021-06-30 LAB — PHOSPHORUS: Phosphorus: 5.3 mg/dL — ABNORMAL HIGH (ref 2.5–4.6)

## 2021-06-30 LAB — HEPATITIS B SURFACE ANTIBODY, QUANTITATIVE: Hep B S AB Quant (Post): <3.1 m[IU]/mL — ABNORMAL LOW (ref >9.9)

## 2021-06-30 LAB — CK: Total CK: 347 U/L (ref 49–397)

## 2021-06-30 LAB — MAGNESIUM: Magnesium: 3.2 mg/dL — ABNORMAL HIGH (ref 1.7–2.4)

## 2021-06-30 IMAGING — DX DG CHEST 1V PORT
1 series · 1 of 1 positions shown · non-contrast
Comparison: [DATE]

CLINICAL DATA: [XP].  Acute respiratory failure

EXAM:
PORTABLE CHEST 1 VIEW

[chest ap]
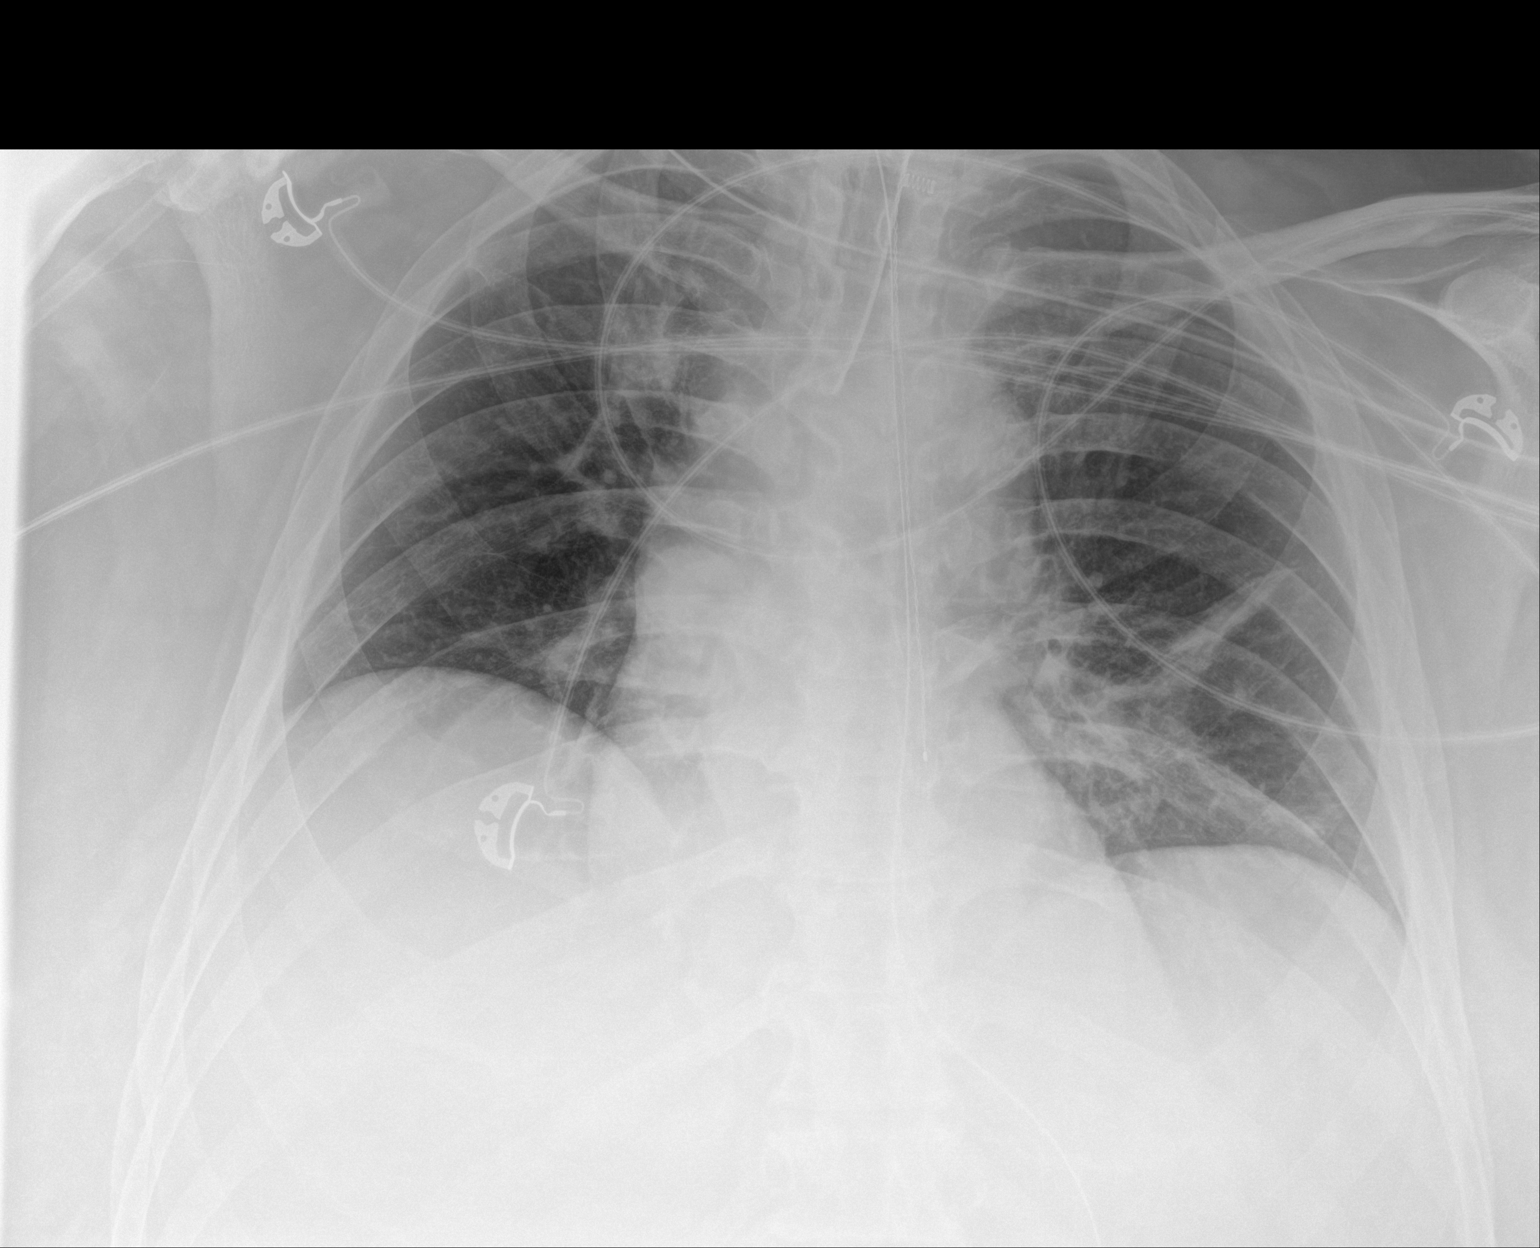

[1 of 1 positions shown; findings below may reference images not displayed]

FINDINGS: Endotracheal tube is seen 3.4 cm above the carina. Esophageal
Doppler probe is seen within the distal esophagus. Nasogastric tube
extends into the upper abdomen beyond the margin of the examination.

Lung volumes are small and pulmonary insufflation is stable since
prior examination. Left basilar atelectasis again seen. Right
internal jugular hemodialysis catheter and left internal jugular
central venous catheter have been removed. Cardiac size within
normal limits. Pulmonary vascularity is normal.
IMPRESSION: Interval removal of central venous catheters.  Stable support tubes.

Stable pulmonary hypoinflation with left basilar atelectasis.

## 2021-06-30 IMAGING — US US ABDOMEN LIMITED
1 series · 14 of 24 positions shown · non-contrast
Comparison: None Available.

CLINICAL DATA: Fever

EXAM:
ULTRASOUND ABDOMEN LIMITED RIGHT UPPER QUADRANT

[Series 1: us abdomen limited ruq (liver/gb) · 14 of 24 slices shown]
[im 1/24]
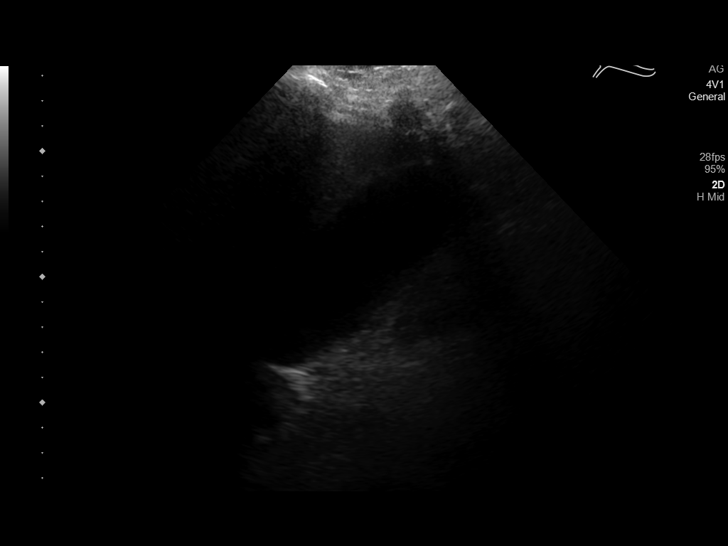
[im 3/24]
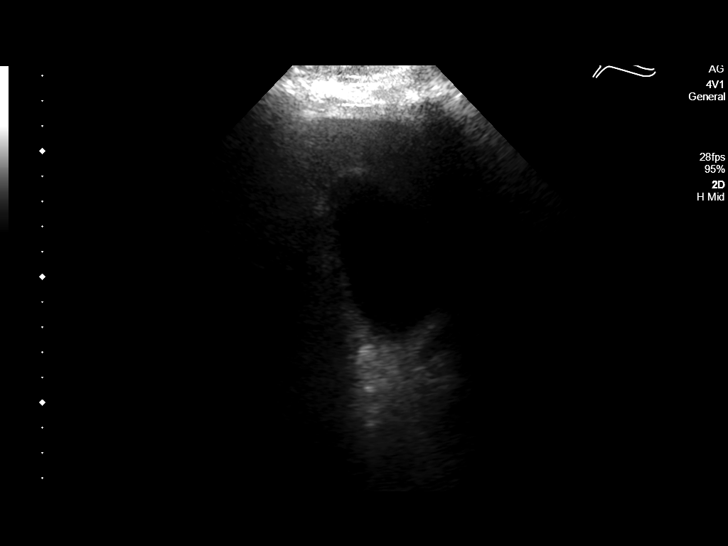
[im 5/24]
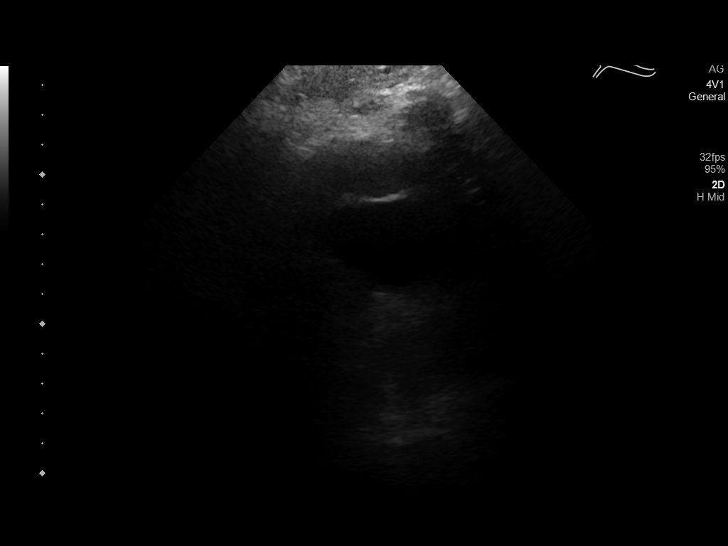
[im 7/24]
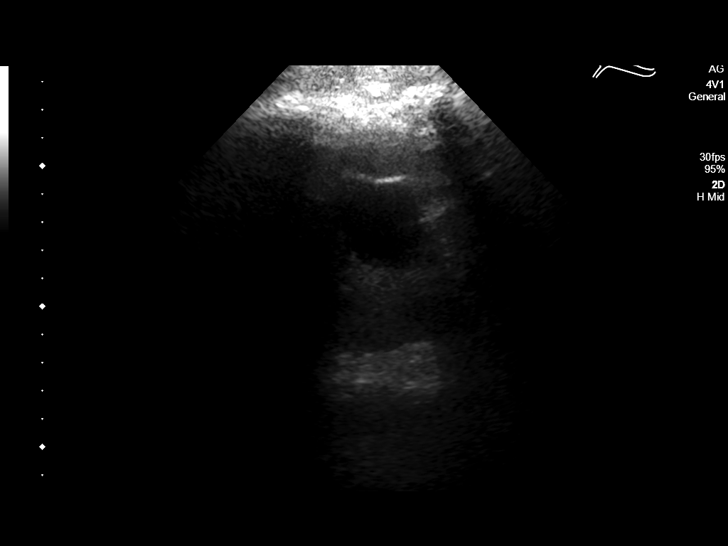
[im 8/24]
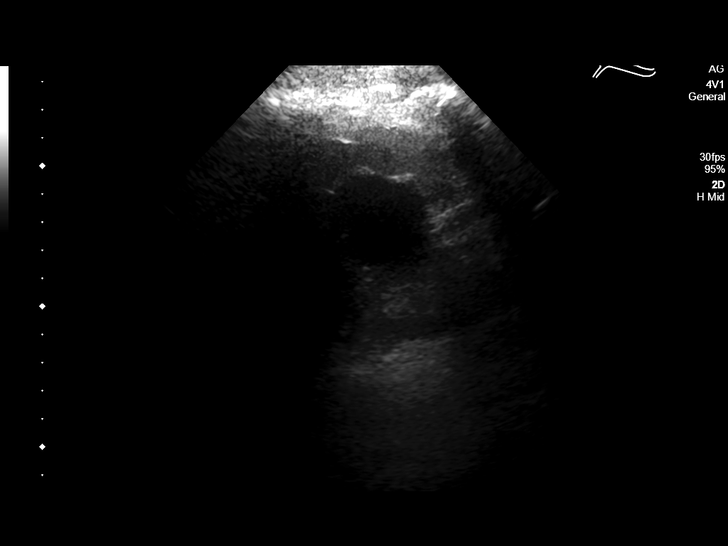
[im 10/24]
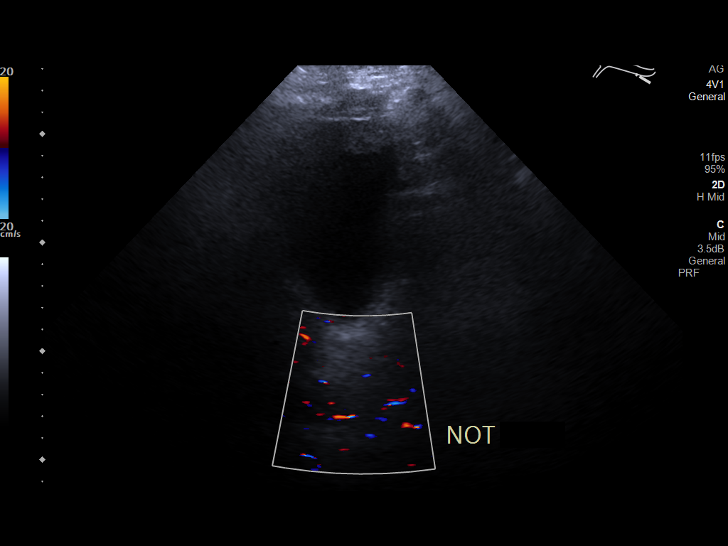
[im 12/24]
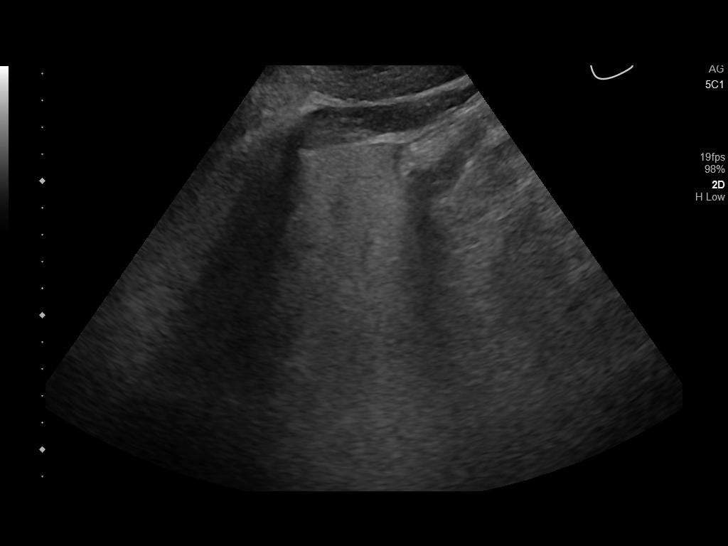
[im 13/24]
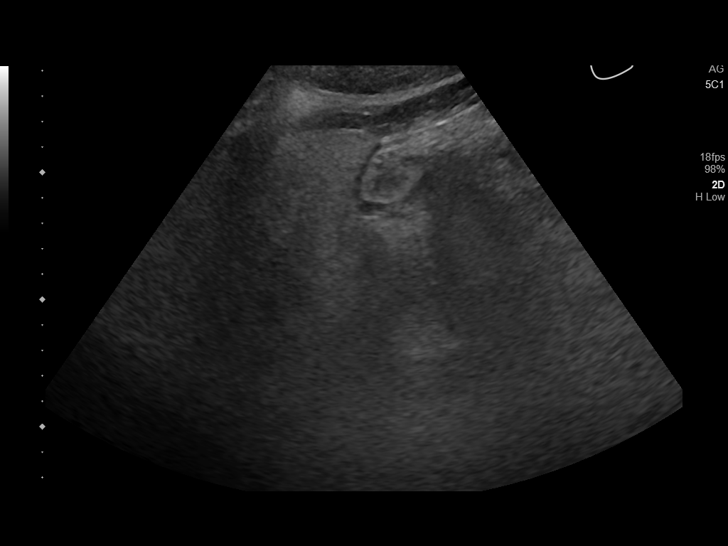
[im 15/24]
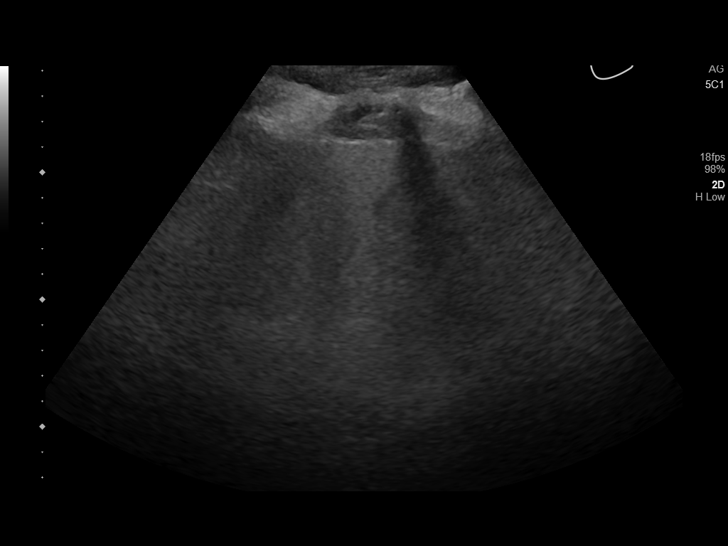
[im 17/24]
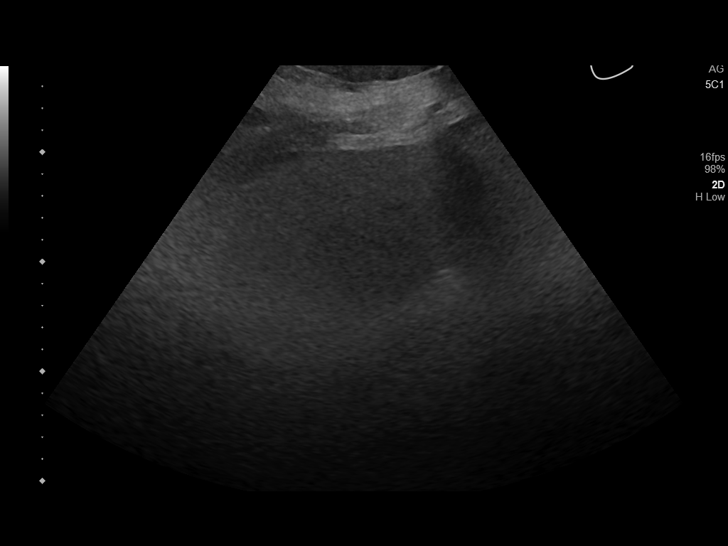
[im 19/24]
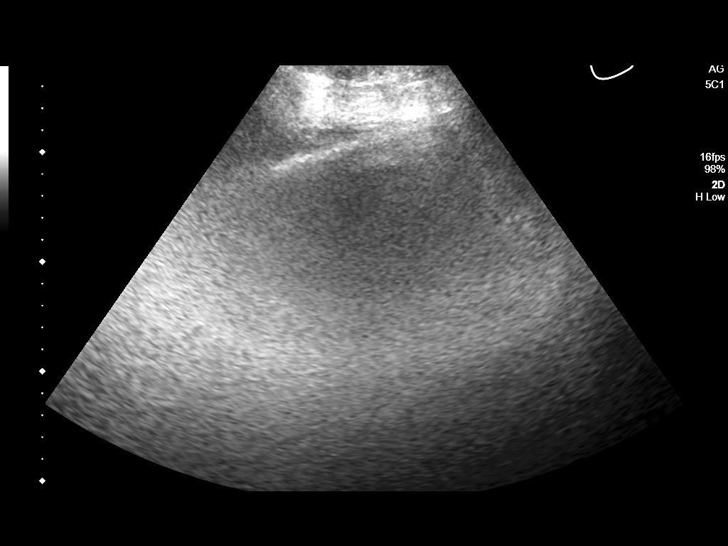
[im 20/24]
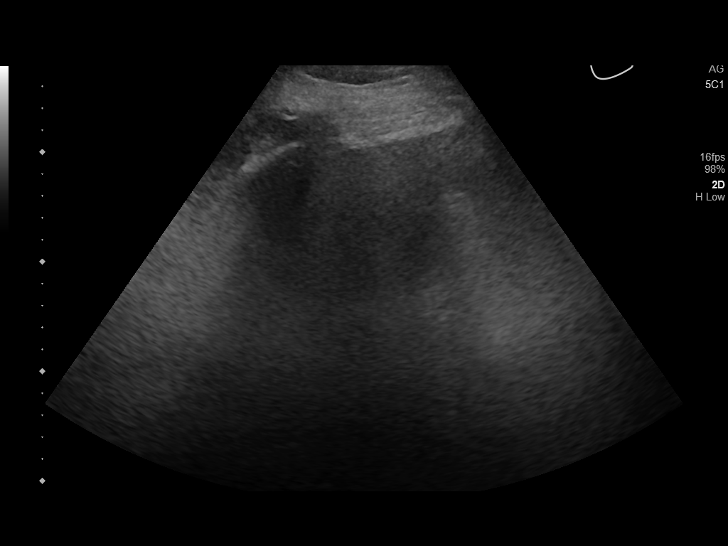
[im 22/24]
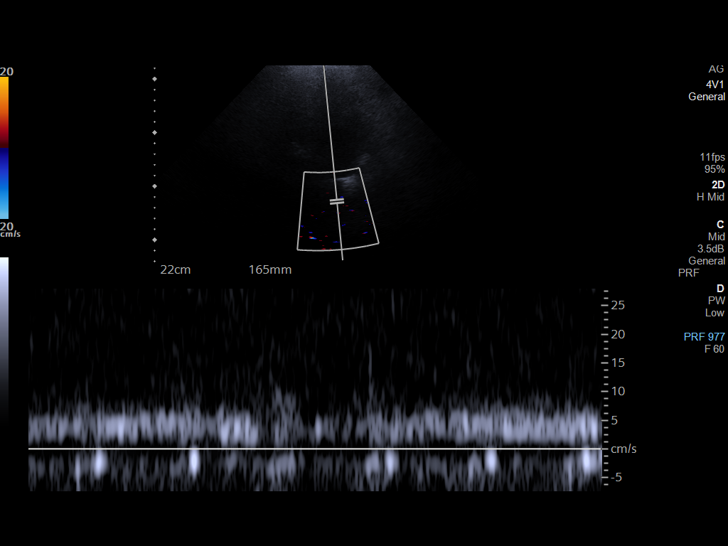
[im 24/24]
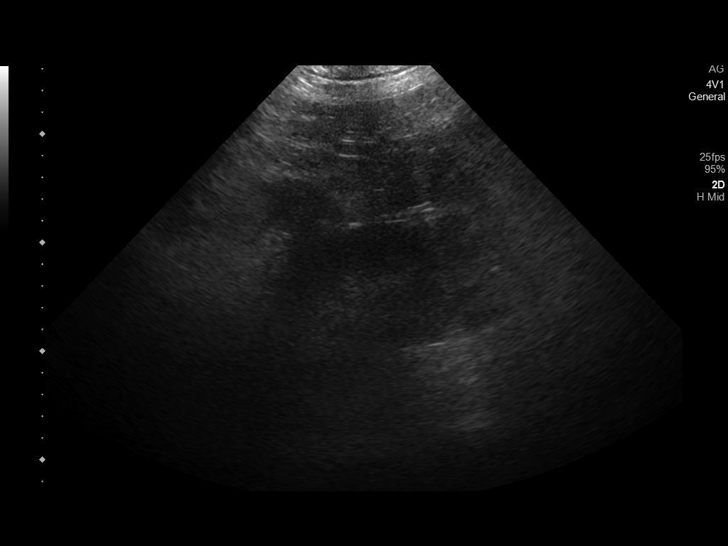

[14 of 24 positions shown; findings below may reference images not displayed]

FINDINGS: Gallbladder:

No gallstones or wall thickening visualized. No sonographic Murphy
sign noted by sonographer.

Common bile duct:

Not visualized.

Liver:

No focal lesion identified. Increased parenchymal echogenicity.
Portal vein is patent on color Doppler imaging with normal direction
of blood flow towards the liver.

Other: None.
IMPRESSION: 1. Limited exam due to patient body habitus. Within limitations,
normal appearance of the gallbladder.
2. Hepatic steatosis.
3. Common bile duct not visualized.

## 2021-06-30 NOTE — Procedures (Signed)
Hemodialysis Catheter Insertion Procedure Note Robert Coffey [id number] 1970-11-01  Procedure: Insertion of Hemodialysis Catheter Indications: Hemodialysis  Procedure Details Consent: Risks of procedure as well as the alternatives and risks of each were explained to the (patient/caregiver).  Consent for procedure obtained.  Time Out: Verified patient identification, verified procedure, site/side was marked, verified correct patient position, special equipment/implants available, medications/allergies/relevent history reviewed, required imaging and test results available.  Performed  Maximum sterile technique was used including antiseptics, cap, gloves, gown, hand hygiene, mask, and sheet.  Skin prep: Chlorhexidine; local anesthetic administered  A Trialysis HD catheter was placed in the left femoral vein due to patient being a dialysis patient using the Seldinger technique.  Evaluation Blood flow good Complications: No apparent complications Patient did tolerate procedure well. Chest X-ray ordered to verify placement.  CXR:  Not needed for femoral line .   Line secured at the 20 cm mark. BIOPATCH applied to the insertion site.   Procedure performed under direct supervision of Dr. Merrilee Jansky and with ultrasound guidance for real time vessel cannulation.       Robert Coffey, AGACNP-BC [facility] Prefer epic messenger for cross cover needs If after hours, please call E-link  06/30/2021, 6:21 PM

## 2021-06-30 NOTE — Consult Note (Signed)
NEUROLOGY CONSULTATION NOTE   Date of service: Jun 30, 2021 Patient Name: Robert Coffey MRN:  [id number] DOB:  [birth year] Reason for consult: not waking up off sedation x3 days Requesting physician: Dr. Flora Lipps _ _ _   _ __   _ __ _ _  __ __   _ __   __ _  History of Present Illness   Neurology consulted on this 51 yo gentleman admitted with acute metabolic encephalopathy in the setting of EtOH withdrawal, hyponatremia. Hospital course complicated by aspiration PNA on unasyn, AKI with anuria requiring CRRT now transitioning to HD, acute liver failure likely multifactorial 2/2 EtOH, tylenol, and shock liver, as well as hypotension requiring pressors. He was hyponatremic at 109 on admission which was corrected at appropriate rate; now normonatremic. Neurology is consulted for failure to wake up off sedation x3 days. As of yesterday, patient had intact brainstem reflexes but no motor response to pain and would not follow commands. MRI brain wo showed no e/o acute infarct or anoxic injury (personal review). rEEG showed diffuse slowing but no epileptiform discharges. Today patient is arousable to loud voice and is able to wiggle bilateral toes on command.   ROS   UTA 2/2 encephalopathy  Past History   I have reviewed the following:  History reviewed. No pertinent past medical history. History reviewed. No pertinent surgical history. History reviewed. No pertinent family history. Social History   Socioeconomic History   Marital status: Married    Spouse name: Not on file   Number of children: Not on file   Years of education: Not on file   Highest education level: Not on file  Occupational History   Not on file  Tobacco Use   Smoking status: Not on file   Smokeless tobacco: Not on file  Substance and Sexual Activity   Alcohol use: Not on file   Drug use: Not on file   Sexual activity: Not on file  Other Topics Concern   Not on file  Social History Narrative   Not on file    Social Determinants of Health   Financial Resource Strain: Not on file  Food Insecurity: Not on file  Transportation Needs: Not on file  Physical Activity: Not on file  Stress: Not on file  Social Connections: Not on file   Allergies  Allergen Reactions   Sulfa Antibiotics     Medications   Medications Prior to Admission  Medication Sig Dispense Refill Last Dose   atorvastatin (LIPITOR) 20 MG tablet Take 20 mg by mouth daily.   06/19/2021 at 1200   cyanocobalamin 1000 MCG tablet Take by mouth.   Past Month   hydrochlorothiazide (HYDRODIURIL) 25 MG tablet Take 25 mg by mouth daily.   06/19/2021   lisinopril (ZESTRIL) 40 MG tablet Take 40 mg by mouth daily.   06/19/2021 at 1800   rOPINIRole (REQUIP) 0.25 MG tablet Take 0.25 mg by mouth 3 (three) times daily.   06/19/2021 at pm   vitamin C (ASCORBIC ACID) 500 MG tablet Take 500 mg by mouth daily.      zolpidem (AMBIEN) 10 MG tablet Take 10 mg by mouth at bedtime as needed.   06/19/2021 at 2200      Current Facility-Administered Medications:     prismasol BGK 4/2.5 infusion, , CRRT, Continuous, Lateef, Munsoor, MD, Last Rate: 500 mL/hr at 06/27/21 2153, New Bag at 06/27/21 2153    prismasol BGK 4/2.5 infusion, , CRRT, Continuous, Lateef, Munsoor, MD, Last Rate: 500  mL/hr at 06/27/21 2152, New Bag at 06/27/21 2152   0.9 %  sodium chloride infusion, 250 mL, Intravenous, Continuous, Wyvonnia Dusky, MD, Stopped at 06/29/21 2223   0.9 %  sodium chloride infusion, , Intravenous, PRN, Tyler Pita, MD, Stopped at 06/26/21 1129   acetaminophen (TYLENOL) tablet 650 mg, 650 mg, Per Tube, Q6H PRN, 650 mg at 06/29/21 1854 **OR** acetaminophen (TYLENOL) suppository 650 mg, 650 mg, Rectal, Q6H PRN, Tyler Pita, MD   artificial tears (LACRILUBE) ophthalmic ointment, , Both Eyes, Q8H, Tonye Royalty, NP, Given at 06/30/21 1420   chlorhexidine gluconate (MEDLINE KIT) (PERIDEX) 0.12 % solution 15 mL, 15 mL, Mouth Rinse, BID, Teressa Lower, NP, 15 mL at 06/30/21 0830   Chlorhexidine Gluconate Cloth 2 % PADS 6 each, 6 each, Topical, Q0600, Agbata, Tochukwu, MD, 6 each at 06/30/21 1020   feeding supplement (PROSource TF) liquid 90 mL, 90 mL, Per Tube, QID, Flora Lipps, MD, 90 mL at 06/30/21 1450   feeding supplement (VITAL AF 1.2 CAL) liquid 1,000 mL, 1,000 mL, Per Tube, Continuous, Flora Lipps, MD, Last Rate: 60 mL/hr at 06/29/21 2131, 1,000 mL at 60/45/40 9811   folic acid injection 1 mg, 1 mg, Intravenous, Daily, [name], RPH, 1 mg at 06/30/21 1050   free water 30 mL, 30 mL, Per Tube, Q4H, Kasa, Maretta Bees, MD, 30 mL at 06/30/21 1654   heparin injection 1,000-6,000 Units, 1,000-6,000 Units, CRRT, PRN, Holley Raring, Munsoor, MD, 2,600 Units at 06/28/21 2324   heparin injection 5,000 Units, 5,000 Units, Subcutaneous, Q12H, Flora Lipps, MD, 5,000 Units at 06/30/21 1050   labetalol (NORMODYNE) injection 10 mg, 10 mg, Intravenous, Q2H PRN, Tonye Royalty, NP, 10 mg at 06/29/21 0221   lactulose (CHRONULAC) 10 GM/15ML solution 20 g, 20 g, Per Tube, TID, Flora Lipps, MD, 20 g at 06/30/21 1655   MEDLINE mouth rinse, 15 mL, Mouth Rinse, 10 times per day, Teressa Lower, NP, 15 mL at 06/30/21 1654   multivitamin with minerals tablet 1 tablet, 1 tablet, Per Tube, Daily, Tyler Pita, MD, 1 tablet at 06/30/21 1050   ondansetron (ZOFRAN) tablet 4 mg, 4 mg, Per Tube, Q6H PRN **OR** ondansetron (ZOFRAN) injection 4 mg, 4 mg, Intravenous, Q6H PRN, Tyler Pita, MD   pantoprazole sodium (PROTONIX) 40 mg/20 mL oral suspension 40 mg, 40 mg, Per Tube, Daily, Darel Hong D, NP, 40 mg at 06/30/21 1050   piperacillin-tazobactam (ZOSYN) IVPB 2.25 g, 2.25 g, Intravenous, Q8H, Kasa, Maretta Bees, MD, Last Rate: 100 mL/hr at 06/30/21 1510, 2.25 g at 06/30/21 1510   prismasol BGK 4/2.5 infusion, , CRRT, Continuous, Lateef, Munsoor, MD, Last Rate: 2,500 mL/hr at 06/28/21 2030, New Bag at 06/28/21 2030   rifaximin (XIFAXAN) tablet 550 mg, 550  mg, Per Tube, BID, Flora Lipps, MD, 550 mg at 06/30/21 1050   [COMPLETED] thiamine 553m in normal saline (582m IVPB, 500 mg, Intravenous, Q8H, Stopped at 06/24/21 2226 **FOLLOWED BY** [COMPLETED] thiamine (B-1) 250 mg in sodium chloride 0.9 % 50 mL IVPB, 250 mg, Intravenous, Q24H, Stopped at 06/26/21 0539 **FOLLOWED BY** thiamine (B-1) injection 100 mg, 100 mg, Intravenous, Q24H, KeDarel Hong, NP, 100 mg at 06/30/21 0507  Vitals   Vitals:   06/30/21 1544 06/30/21 1600 06/30/21 1700 06/30/21 1800  BP:  129/87 127/90 121/85  Pulse:  (!) 106 (!) 107 (!) 108  Resp:  (!) 22 (!) 22 (!) 24  Temp:  99.5 F (37.5 C) 99.3 F (37.4 C) 100.2 F (  37.9 C)  TempSrc:    Esophageal  SpO2: 99% 98% 99% 100%  Weight:      Height:         Body mass index is 40.89 kg/m.  Physical Exam   Physical Exam Gen: intubated, not sedated, follows some simple commands Resp: intubated CV: RRR  Neuro: *MS: intubated, not sedated, follows some simple commands *Speech: no attempts to speak *CN: PERRL, (+) corneals, oculocephalics, cough, gag, face symmetric at rest, hearing intact to voice *Motor:   able to wiggle toes to command *Sensory: Withdraws to noxious stimuli BLE>BUE *Coordination, gait:  UTA *Reflexes:  1+ and symmetric throughout without clonus; toes mute bilat   Labs   CBC:  Recent Labs  Lab 06/29/21 0330 06/30/21 0420  WBC 13.9* 17.2*  HGB 13.9 15.2  HCT 41.7 44.6  MCV 112.1* 111.8*  PLT 62* 85*    Basic Metabolic Panel:  Lab Results  Component Value Date   NA 142 06/30/2021   K 4.3 06/30/2021   CO2 20 (L) 06/30/2021   GLUCOSE 151 (H) 06/30/2021   BUN 90 (H) 06/30/2021   CREATININE 6.04 (H) 06/30/2021   CALCIUM 7.9 (L) 06/30/2021   GFRNONAA 11 (L) 06/30/2021   Lipid Panel: No results found for: LDLCALC HgbA1c: No results found for: HGBA1C Urine Drug Screen:     Component Value Date/Time   LABOPIA NONE DETECTED 06/25/2021 0629   COCAINSCRNUR NONE DETECTED  07/04/2021 0629   LABBENZ NONE DETECTED 07/04/2021 0629   AMPHETMU NONE DETECTED 06/16/2021 0629   THCU NONE DETECTED 06/13/2021 0629   LABBARB NONE DETECTED 07/02/2021 0629    Alcohol Level     Component Value Date/Time   ETH <10 06/29/2021 0629     Impression   51 yo gentleman admitted with acute metabolic encephalopathy in the setting of EtOH withdrawal, hyponatremia. Hospital course complicated by aspiration PNA on unasyn, AKI with anuria requiring CRRT now transitioning to HD, acute liver failure likely multifactorial 2/2 EtOH, tylenol, and shock liver, as well as hypotension requiring pressors. He was hyponatremic at 109 on admission which was corrected at appropriate rate; now normonatremic. Neurology is consulted for failure to wake up off sedation x3 days. His exam has actually improved since yesterday and is able to follow some simple commands including wiggling each toes. Suspect encephalopathy multifactorial 2/2 residual effects of sedation, acute liver failure, acute renal failure, and aspiration PNA. 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   Recommendations   - Check CK - No further neurologic workup indicated at this time, will f/u qod to monitor for improvement ______________________________________________________________________   Thank you for the opportunity to take part in the care of this patient. If you have any further questions, please contact the neurology consultation attending.  Signed,  Su Monks, MD [facility] [phone]  If 7pm- 7am, please page neurology on call as listed in [place].   Any copied and pasted documentation in this note was written by me in another note that was not billed for and pasted here.

## 2021-06-30 NOTE — Consult Note (Signed)
[place] for Electrolyte Monitoring and Replacement   Recent Labs: Potassium (mmol/L)  Date Value  06/30/2021 4.3   Magnesium (mg/dL)  Date Value  06/30/2021 3.2 (H)   Calcium (mg/dL)  Date Value  06/30/2021 7.9 (L)   Albumin (g/dL)  Date Value  06/29/2021 1.9 (L)  06/29/2021 1.9 (L)   Phosphorus (mg/dL)  Date Value  06/30/2021 5.3 (H)   Sodium (mmol/L)  Date Value  06/30/2021 142   Assessment: Patient is a 51 y/o M with medical history including obesity, chronic back pain, EtOH use disorder who presented to the ED 5/16 with weakness in setting of abdominal cramps, nausea, vomiting, and diarrhea. Patient subsequently admitted for severe symptomatic hyponatremia. Patient decompensated on 5/18 with acute onset respiratory failure requiring intubation. Patient remains admitted to the ICU where he is intubated, sedated, and on mechanical ventilation. There is further concern for septic shock and patient is requiring multiple vasopressor infusions. Admission further complicated by acute renal failure necessitating initiation of CRRT on 5/19. Pharmacy consulted to assist with electrolyte monitoring and replacement as indicated.  Nutrition: Tube feeds initiated 5/22 Cathartics: Lactulose 20 g TID for hepatic encephalopathy  CRRT discontinued 5/24 evening per nephrology. Anticipate transition to iHD - first session tentatively 5/26  Goal of Therapy:  Electrolytes within normal limits  Plan:  --No electrolyte replacement indicated at this time --Follow-up electrolytes with AM labs tomorrow  Benita Gutter 06/30/2021 7:52 AM

## 2021-06-30 NOTE — Progress Notes (Signed)
Date of Admission:  06/10/2021     ID: Robert Coffey is a 51 y.o. male  Principal Problem:   Acute metabolic encephalopathy Active Problems:   Acute kidney injury ([place])   Hyponatremia   Alcohol withdrawal ([place])   Transaminitis  Wife t bed side  Subjective: Pt remains intubated No fever  Medications:   artificial tears   Both Eyes Q8H   chlorhexidine gluconate (MEDLINE KIT)  15 mL Mouth Rinse BID   Chlorhexidine Gluconate Cloth  6 each Topical Q0600   feeding supplement (PROSource TF)  90 mL Per Tube QID   folic acid  1 mg Intravenous Daily   free water  30 mL Per Tube Q4H   heparin injection (subcutaneous)  5,000 Units Subcutaneous Q12H   lactulose  20 g Per Tube TID   mouth rinse  15 mL Mouth Rinse 10 times per day   multivitamin with minerals  1 tablet Per Tube Daily   pantoprazole sodium  40 mg Per Tube Daily   rifaximin  550 mg Per Tube BID   thiamine injection  100 mg Intravenous Q24H    Objective: Vital signs in last 24 hours: Temp:  [97.2 F (36.2 C)-102.7 F (39.3 C)] 97.9 F (36.6 C) (05/26 1307) Pulse Rate:  [97-125] 97 (05/26 1307) Resp:  [16-42] 16 (05/26 1307) BP: (113-138)/(72-95) 126/93 (05/26 1300) SpO2:  [94 %-100 %] 98 % (05/26 1307) FiO2 (%):  [30 %] 30 % (05/26 1134) Weight:  [148.4 kg] 148.4 kg (05/26 0320)    PHYSICAL EXAM:  General: intubated and sedated  Head: Normocephalic, without obvious abnormality, atraumatic. Eyes: chemosis of conjunctiva b/l Lungs: b/l air entry Crepts bases NG feeds Heart:Tachycardia Abdomen: Soft, non-tender,not distended. Bowel sounds normal. No masses Extremities: atraumatic, no cyanosis. No edema. No clubbing Skin: No rashes or lesions. Or bruising Lymph: Cervical, supraclavicular normal. Neurologic: cannot be assessed  Lab Results Recent Labs    06/29/21 0330 06/30/21 0420  WBC 13.9* 17.2*  HGB 13.9 15.2  HCT 41.7 44.6  NA 140 142  K 4.5 4.3  CL 109 107  CO2 22 20*  BUN 52* 90*   CREATININE 2.77* 6.04*   Liver Panel Recent Labs    06/28/21 1524 06/29/21 0330  PROT  --  5.2*  ALBUMIN 2.1* 1.9*  1.9*  AST  --  486*  ALT  --  229*  ALKPHOS  --  110  BILITOT  --  7.2*  BILIDIR  --  4.2*  IBILI  --  3.0*   Sedimentation Rate No results for input(s): ESRSEDRATE in the last 72 hours. C-Reactive Protein No results for input(s): CRP in the last 72 hours.  Microbiology:  Studies/Results: MR BRAIN WO CONTRAST  Result Date: 06/29/2021 CLINICAL DATA:  Mental status change, unknown cause. Acute metabolic encephalopathy. EXAM: MRI HEAD WITHOUT CONTRAST TECHNIQUE: Multiplanar, multiecho pulse sequences of the brain and surrounding structures were obtained without intravenous contrast. COMPARISON:  Head CT 06/26/2021 FINDINGS: Brain: There is no evidence of an acute infarct, intracranial hemorrhage, mass, midline shift, or extra-axial fluid collection. There is mild cerebral atrophy. The brain is normal in signal. Vascular: Major intracranial vascular flow voids are preserved. Skull and upper cervical spine: Heterogeneously diminished bone marrow T1 signal intensity in the skull base and included upper cervical spine, nonspecific though can be seen with anemia, smoking, obesity, and other chronic systemic disease. Sinuses/Orbits: Unremarkable orbits. Moderate volume fluid in the sphenoid sinuses. Polypoid mucosal thickening or small mucous retention cysts  in the maxillary sinuses. Bilateral mastoid and middle ear effusions. Other: None. IMPRESSION: 1. No acute intracranial abnormality. 2. Mild cerebral atrophy. Electronically Signed   By: Robert Coffey M.D.   On: 06/29/2021 14:33   US Venous Img Lower Bilateral (DVT)  Result Date: 06/29/2021 CLINICAL DATA:  Bilateral lower extremity edema.  Evaluate for DVT. EXAM: BILATERAL LOWER EXTREMITY VENOUS DOPPLER ULTRASOUND TECHNIQUE: Gray-scale sonography with graded compression, as well as color Doppler and duplex ultrasound were  performed to evaluate the lower extremity deep venous systems from the level of the common femoral vein and including the common femoral, femoral, profunda femoral, popliteal and calf veins including the posterior tibial, peroneal and gastrocnemius veins when visible. The superficial great saphenous vein was also interrogated. Spectral Doppler was utilized to evaluate flow at rest and with distal augmentation maneuvers in the common femoral, femoral and popliteal veins. COMPARISON:  Bilateral lower extremity venous Doppler ultrasound-06/23/2021 (negative) FINDINGS: RIGHT LOWER EXTREMITY Common Femoral Vein: No evidence of thrombus. Normal compressibility, respiratory phasicity and response to augmentation. Saphenofemoral Junction: No evidence of thrombus. Normal compressibility and flow on color Doppler imaging. Profunda Femoral Vein: No evidence of thrombus. Normal compressibility and flow on color Doppler imaging. Femoral Vein: No evidence of thrombus. Normal compressibility, respiratory phasicity and response to augmentation. Popliteal Vein: No evidence of thrombus. Normal compressibility, respiratory phasicity and response to augmentation. Calf Veins: No evidence of thrombus. Normal compressibility and flow on color Doppler imaging. Superficial Great Saphenous Vein: No evidence of thrombus. Normal compressibility. Other Findings:  None. LEFT LOWER EXTREMITY Common Femoral Vein: No evidence of thrombus. Normal compressibility, respiratory phasicity and response to augmentation. Saphenofemoral Junction: No evidence of thrombus. Normal compressibility and flow on color Doppler imaging. Profunda Femoral Vein: No evidence of thrombus. Normal compressibility and flow on color Doppler imaging. Femoral Vein: No evidence of thrombus. Normal compressibility, respiratory phasicity and response to augmentation. Popliteal Vein: No evidence of thrombus. Normal compressibility, respiratory phasicity and response to  augmentation. Calf Veins: No evidence of thrombus. Normal compressibility and flow on color Doppler imaging. Superficial Great Saphenous Vein: No evidence of thrombus. Normal compressibility. Other Findings:  None. IMPRESSION: No evidence of DVT within either lower extremity. Electronically Signed   By: Robert Coffey M.D.   On: 06/29/2021 16:48   DG Chest Port 1 View  Result Date: 06/30/2021 CLINICAL DATA:  [id number].  Acute respiratory failure EXAM: PORTABLE CHEST 1 VIEW COMPARISON:  06/28/2021 FINDINGS: Endotracheal tube is seen 3.4 cm above the carina. Esophageal Doppler probe is seen within the distal esophagus. Nasogastric tube extends into the upper abdomen beyond the margin of the examination. Lung volumes are small and pulmonary insufflation is stable since prior examination. Left basilar atelectasis again seen. Right internal jugular hemodialysis catheter and left internal jugular central venous catheter have been removed. Cardiac size within normal limits. Pulmonary vascularity is normal. IMPRESSION: Interval removal of central venous catheters.  Stable support tubes. Stable pulmonary hypoinflation with left basilar atelectasis. Electronically Signed   By: Robert Coffey M.D.   On: 06/30/2021 02:28   EEG adult  Result Date: 06/30/2021 Robert Jack, MD     06/30/2021 10:03 AM Routine EEG Report Robert Coffey is a 51 y.o. male with a history of delirium tremens who is undergoing an EEG to evaluate for seizures. Report: This EEG was acquired with electrodes placed according to the International 10-20 electrode system (including Fp1, Fp2, F3, F4, C3, C4, P3, P4, O1, O2, T3, T4, T5, T6, A1, A2, Fz,  Cz, Pz). The following electrodes were missing or displaced: none. The background was composed of low amplitude continuous slowing at 3-5 Hz. This activity is reactive to stimulation. There was no sleep architecture identified. There was no focal slowing. There were no interictal epileptiform discharges. There  were no electrographic seizures identified. Photic stimulation and hyperventilation were not performed. Impression and clinical correlation: This EEG was obtained while comatose and is abnormal due to severe diffuse slowing consistent with global cerebral dysfunction. Epileptiform abnormalities were not seen during this recording. Robert Monks, MD [facility] [phone] If 7pm- 7am, please page neurology on call as listed in [place].   US Abdomen Limited RUQ (LIVER/GB)  Result Date: 06/30/2021 CLINICAL DATA:  Fever EXAM: ULTRASOUND ABDOMEN LIMITED RIGHT UPPER QUADRANT COMPARISON:  None Available. FINDINGS: Gallbladder: No gallstones or wall thickening visualized. No sonographic Murphy sign noted by sonographer. Common bile duct: Not visualized. Liver: No focal lesion identified. Increased parenchymal echogenicity. Portal vein is patent on color Doppler imaging with normal direction of blood flow towards the liver. Other: None. IMPRESSION: 1. Limited exam due to patient body habitus. Within limitations, normal appearance of the gallbladder. 2. Hepatic steatosis. 3. Common bile duct not visualized. Electronically Signed   By: Robert Coffey M.D.   On: 06/30/2021 09:03     Assessment/Plan: Acute metabolic encephalopathy on presentation due to hyponatremia and alcohol withdrawal  Acute hypoxia with hypercapnic resp failure- intubated Fever after a week in the hospital- foley and central lines removed No fever since morning  Aspiration pneumonia on zosyn  AKI/ Anuria Pt will be going back on dialysis   Liver failure- multifactorial - alcohol, tylenol, shock liver  AUD with Dts    CT scan shows left adrenal adenoma  Discussed the management with his wife and care team ID will follow him peripherally this weekend Call if needed

## 2021-06-30 NOTE — Progress Notes (Signed)
Neurology consulted on this 51 yo gentleman admitted with acute metabolic encephalopathy in the setting of EtOH withdrawal, hyponatremia. Hospital course complicated by PNA, AKI requiring CRRT, hypotension requiring pressors. On examination he has intact brainstem reflexes but does not follow commands and he has no movement to noxious stimuli. He has been off sedation for 3 days. Agree with MRI brain and rEEG which are pending. Full consult note to follow.  Robert Monks, MD [facility] [phone]  If 7pm- 7am, please page neurology on call as listed in [place].

## 2021-06-30 NOTE — Progress Notes (Signed)
NAME:  Robert Coffey, MRN:  [id number], DOB:  1970-11-17, LOS: 56 ADMISSION DATE:  07/03/2021, CONSULTATION DATE:  06/22/2021 REFERRING MD:  Dr. Jimmye Norman, CHIEF COMPLAINT:  Acute Respiratory Distress, AMS   Brief Pt Description / Synopsis:  51 year old male admitted with acute metabolic encephalopathy secondary to alcohol withdrawal and severe hyponatremia requiring hypertonic saline.  On 5/18 developed acute hypoxic & hypercapnic respiratory failure requiring intubation and mechanical ventilation.  History of Present Illness:  Robert Coffey is a 51 year old male with a past medical history significant for alcohol abuse, obesity, chronic back pain, peripheral neuropathy who presented to [facility] ED on 06/19/2021 due to altered mental status, poor p.o. intake, progressive weakness with difficulty getting around at home.  Patient is now intubated and unable to contribute to history, therefore history is obtained from patient's wife at bedside and chart review.  Per the patient's wife, the patient has not been feeling well with poor p.o. intake for approximately a month, however has continued to drink alcohol daily (usually 3-4 drinks of liquor) during that time.  Over the past several days he has become progressively weak with difficulty getting around at home (not even in bed able to get out of bed), intermittent confusion, and diffuse abdominal cramping/nausea/vomiting/diarrhea.  Denies any history of falls, chest pain, shortness of breath, fever, cough.  Last known alcoholic drink was the day prior to admission on Sunday.  ED Course: Initial Vital Signs: Temperature 98.1 F orally, blood pressure 136/119, respiratory rate 18, pulse 106, SPO2 95% Significant Labs: Sodium 109, chloride 69, glucose 108, BUN 8, creatinine 1.62, anion gap 17, albumin 3.1, lipase 86, AST 256, ALT 119, total bilirubin 5.0, high-sensitivity troponin 67, serum osmolality 236, urine osmolality 437, urine sodium less than 10 WBC  11.0, platelets 129, TSH 2.1 COVID-19 and influenza PCR negative Urinalysis negative for UTI Urine drug screen positive for tricyclics Ethyl alcohol less than 10 Imaging CT head without contrast>>IMPRESSION: 1. Fluid or debris in the left sinus tympani/middle ear, cannot exclude otitis media. There small bilateral mastoid effusions and chronic bilateral maxillary sinusitis. 2. Advanced for age cerebral atrophy. 3. Atherosclerosis. CT abdomen and pelvis>>IMPRESSION: Hepatic steatosis. 2.6 cm enhancing left adrenal nodule is noted. When the patient is clinically stable and able to follow directions and hold their breath (preferably as an outpatient) further evaluation with dedicated abdominal MRI should be considered. Medications Administered: 1 L normal saline bolus, hypertonic saline infusion  He was admitted by the hospitalist for further work-up and treatment of acute metabolic encephalopathy in the setting of DTs and severe hyponatremia.  Please see "significant hospital events" section below for full detailed hospital course.  Pertinent  Medical History  Alcohol abuse Obesity Chronic back  Micro Data:  5/16: SARS-CoV-2 and influenza PCR>> negative 5/16: HIV screen>> nonreactive 5/16: MRSA PCR>> negative 5/18: Tracheal aspirate>>normal respiratory flora 5/19: Blood x2>>negative 5/25: Tracheal aspirate>>rare gram - rods, rare yeast with pseudohyphae 5/25: MRSA PCR>> negative  Antimicrobials:  5/18: Unasyn>>x2 doses, restarted 5/22>>5/24 5/18: Cefepime>>5/19 5/18: Vancomycin>>5/19 5/18: Flagyl>>5/21 5/25: Zosyn>>  Significant Hospital Events: Including procedures, antibiotic start and stop dates in addition to other pertinent events   5/16: Admitted by hospitalist for acute metabolic encephalopathy in setting of severe hyponatremia and DTs.  Requiring hypertonic saline 5/18: Developed acute hypoxic respiratory failure, possible concern for aspiration.  Required  intubation and mechanical ventilation. 5/19: Pt with worsening acute renal failure with severe metabolic and lactic acidosis requiring CRRT.  Severely hypotensive requiring epinephrine, levophed, and vasopressin gtts to  maintain map >65  5/20: Remains critically ill, on CRRT. Vent support slightly weaned to 60% FiO2 & 10 PEEP. Some improvement in vasopressor requirements (off of epinephrine and phenylephrine) 5/21: Overnight weaned off of Giapreza, currently only on norepinephrine and vasopressin 5/22: No acute events overnight on minimal ventilator settings FiO2 30% PEEP 8.  Remains on levophed gtt to maintain map >65.  Will perform WUA  5/22: CT Head revealed No acute intracranial findings. No        change from CT 07/03/2021. 5/24: Transitioned off CRRT 5/25: Neuro exam remains poor off sedation, plan for MRI Brain.  New fever, obtain UA/TA/RUQ [place] & Doppler LE, line holiday (central line, trialysis, and A-line all removed). Empiric Zosyn started.  Neurology and ID consulted 5/26: Neuro exam slightly improving.  Fever resolved.  Plan to place new temporary HD catheter per Nephrology request.  Interim History / Subjective:  -No significant events overnight -Fever resolved overnight without administration of tylenol  -Repeat tracheal aspirate preliminary results with rate gram - rods & rare yeast ~ ID following, continue empiric Zosyn for now -Leukocytosis slightly worsened to 17.2 K (13.9 K) -Hemodynamically stable, remains off vasopressors -Minimal ventilator settings FiO2 30% PEEP 5 ~ will exercise in PSV as tolerated -MRI Brain negative yesterday ~ pt opens eyes to voice and wiggles toes, tries to move UE but extremely weak   Objective   Blood pressure 134/87, pulse (!) 106, temperature 99.3 F (37.4 C), resp. rate (!) 26, height $RemoveBe'6\' 3"'sOXoHJifn$  (1.905 m), weight (!) 148.4 kg, SpO2 98 %.    Vent Mode: Spontaneous FiO2 (%):  [30 %] 30 % Set Rate:  [22 bmp] 22 bmp Vt Set:  [650 mL] 650 mL PEEP:   [5 cmH20] 5 cmH20 Pressure Support:  [14 cmH20] 14 cmH20 Plateau Pressure:  [18 cmH20] 18 cmH20   Intake/Output Summary (Last 24 hours) at 06/30/2021 0827 Last data filed at 06/30/2021 0000 Gross per 24 hour  Intake 99.08 ml  Output 1000 ml  Net -900.92 ml    Filed Weights   06/28/21 0331 06/29/21 0300 06/30/21 0320  Weight: (!) 151 kg (!) 148.4 kg (!) 148.4 kg   Examination: General: Acutely ill-appearing obese male, laying in bed, NAD, on NO sedation, mechanically intubated  HENT: Atraumatic, normocephalic, neck supple, difficult to assess JVD due to body habitus Lungs: Coarse breath sounds throughout, even, non labored, synchronous with vent  Cardiovascular: Tachycardia, Regular rhythm,  no murmurs, rubs, gallops, 1+ generalized edema  Abdomen: +BS x4, obese, soft, non distended  Extremities: Normal bulk and tone Neuro: Off sedation, opens eyes to verbal stimulation, wiggles toes bilaterally (attempting to move UE but extremely weak), PERRL, scleredema  GU: Indwelling foley catheter draining yellow urine   Imaging     Assessment & Plan:   Acute hypoxic & hypercapnic respiratory failure in the setting of suspected aspiration in the setting of severe DTs Pulmonary Embolism - cannot r/o -Full vent support, on lung protective strategies -Plateau pressures less than 30 cm H20 -Wean FiO2 & PEEP as tolerated to maintain O2 sats >92% -Follow intermittent Chest X-ray & ABG as needed -Spontaneous Breathing Trials when respiratory parameters met and mental status permits -Continue VAP Bundle -PRN Bronchodilators -Venous US Bilateral LE negative for DVT x2  Septic shock~RESOLVED Elevated troponin suspect secondary to demand ischemia -Continuous cardiac monitoring -Maintain MAP >65 -Vasopressors as needed to maintain MAP goal -D/c Midodrine 5/25 -HS Troponin peaked at 67 -Echocardiogram 06/23/21: LVEF 60-65%, Grade I DD, normal RV  systolic function  Severe Sepsis due to  Suspected aspiration~TREATED CT head 5/16 unable to exclude left otitis media and chronic bilateral maxillary sinusitis New Fever 5/25 ~ RESOLVED -Monitor fever curve -Trend WBC's & Procalcitonin -Follow cultures as above -Completed course of Unasyn  - ID following, continue empiric Zosyn for now as per ID -Line holiday given on 5/25  Hypotonic hyponatremia, suspect secondary to dehydration & poor p.o. intake~RESOLVED Acute kidney injury secondary to ATN with severe metabolic acidosis  Lactic acidosis persistent likely due to hepatic impairment -Trend BMP, lactic acid, and ABG -Persistent lactic acid elevation likely due to hepatic dysfunction -Replace electrolytes as indicated  -Monitor UOP -Nephrology following, appreciate input: Transitioned off CRRT to intermittent HD 5/24  Transaminitis secondary to hepatic steatosis & ETOH abuse & Shock liver -Trend LFTs and coags -AFP tumor marker normal  Thrombocytopenia, suspect secondary to alcohol abuse -Trend CBC -Monitor for s/sx of bleeding and transfuse for Hgb <7 -Transfuse platelets for platelet count less than 50 with active bleeding  Incidental finding of left adrenal nodule via CT Abd/Pelvis  -Will need abdominal MRI for further evaluation in the outpatient setting   Acute metabolic encephalopathy in the setting of severe DTs and severe hyponatremia Sedation needs in the setting of mechanical ventilation -Maintain a RASS goal of 0 to -1 -Fentanyl and versed as needed to maintain RASS goal -Avoid sedating medications as able -Daily wake up assessment -Continue thiamine  -Continue folic acid and multivitamin -Continue CIWA protocol -Continue lactulose and rifaximin trend ammonia level  -MRI Brain 5/25 negative -Neurology consulted, appreciate input -EEG negative for seizures  Morbid obesity with sarcopenia Protein malnutrition -Calorie excess, protein deficiency in the setting of alcoholism -Dietitian consulted for  initiation of TF's  Best Practice (right click and "Reselect all SmartList Selections" daily)  Diet/type: TF's DVT prophylaxis: Subq heparin  GI prophylaxis: PPI Lines: N/A Foley:  N/A Code Status: DNR  Last date of multidisciplinary goals of care discussion [06/30/21]  Update pts wife 5/26 at bedside.  All questions answered to her satisfaction.  Labs   CBC: Recent Labs  Lab 06/26/21 0316 06/27/21 0357 06/28/21 0335 06/29/21 0330 06/30/21 0420  WBC 15.1* 14.1* 12.6* 13.9* 17.2*  HGB 13.9 14.0 13.8 13.9 15.2  HCT 40.5 41.6 40.8 41.7 44.6  MCV 111.3* 110.6* 111.5* 112.1* 111.8*  PLT 85* 87* 62* 62* 85*     Basic Metabolic Panel: Recent Labs  Lab 06/26/21 0316 06/26/21 1425 06/27/21 0357 06/27/21 1631 06/28/21 0335 06/28/21 1524 06/29/21 0330 06/30/21 0420  NA 137 137   < > 139 139 139 140 142  K 3.5 3.2*   < > 3.3* 3.9 4.2 4.5 4.3  CL 104 104   < > 108 107 107 109 107  CO2 21* 22   < > 20* $Rem'22 23 22 'zskH$ 20*  GLUCOSE 123* 125*   < > 143* 156* 141* 131* 151*  BUN 28* 31*   < > 38* 41* 38* 52* 90*  CREATININE 1.76* 1.93*   < > 2.15* 2.17* 2.19* 2.77* 6.04*  CALCIUM 9.2 9.3   < > 7.5* 7.6* 7.7* 7.3* 7.9*  MG 2.2 2.1  --  2.4  --  2.6*  --  3.2*  PHOS 2.4* 2.6   < > 1.5* 2.0* 2.9 3.2 5.3*   < > = values in this interval not displayed.    GFR: Estimated Creatinine Clearance: 23.3 mL/min (A) (by C-G formula based on SCr of 6.04 mg/dL (H)). Recent Labs  Lab 06/24/21 0338 06/24/21 0339 06/25/21 0320 06/25/21 0332 06/26/21 0316 06/27/21 0357 06/27/21 1001 06/27/21 2219 06/28/21 0335 06/29/21 0330 06/30/21 0420  PROCALCITON 7.12  --  5.62  --   --   --   --   --   --   --   --   WBC 17.4*  --  16.3*  --  15.1* 14.1*  --   --  12.6* 13.9* 17.2*  LATICACIDVEN  --    < >  --  4.1* 3.3*  --  3.3* 3.0*  --   --   --    < > = values in this interval not displayed.     Liver Function Tests: Recent Labs  Lab 06/24/21 0339 06/24/21 1406 06/25/21 0320  06/25/21 0332 06/26/21 0332 06/26/21 1425 06/27/21 0357 06/27/21 1631 06/28/21 0335 06/28/21 1524 06/29/21 0330  AST 1,148*  --  1,000*  --  665*  --   --   --   --   --  486*  ALT 280*  --  289*  --  251*  --   --   --   --   --  229*  ALKPHOS 138*  --  125  --  110  --   --   --   --   --  110  BILITOT 8.0*  --  7.9*  --  7.2*  --   --   --   --   --  7.2*  PROT 6.0*  --  5.8*  --  5.5*  --   --   --   --   --  5.2*  ALBUMIN 2.4*   < > 2.3*   < > 2.1*   < > 2.0* 1.8* 1.9* 2.1* 1.9*  1.9*   < > = values in this interval not displayed.    No results for input(s): LIPASE, AMYLASE in the last 168 hours.  Recent Labs  Lab 06/23/21 1140 06/25/21 0332 06/27/21 0357  AMMONIA 40* 85* 59*     ABG    Component Value Date/Time   PHART 7.41 06/29/2021 1130   PCO2ART 32 06/29/2021 1130   PO2ART 94 06/29/2021 1130   HCO3 20.3 06/29/2021 1130   ACIDBASEDEF 3.3 (H) 06/29/2021 1130   O2SAT 98.3 06/29/2021 1130      Coagulation Profile: No results for input(s): INR, PROTIME in the last 168 hours.   Cardiac Enzymes: No results for input(s): CKTOTAL, CKMB, CKMBINDEX, TROPONINI in the last 168 hours.  HbA1C: No results found for: HGBA1C  CBG: Recent Labs  Lab 06/29/21 1917 06/29/21 2311 06/29/21 2332 06/30/21 0311 06/30/21 0726  GLUCAP 146* 145* 158* 145* 134*     Review of Systems:   Unable to assess due to intubation/sedation/critical illness  Allergies Allergies  Allergen Reactions   Sulfa Antibiotics      Home Medications  Prior to Admission medications   Medication Sig Start Date End Date Taking? Authorizing Provider  atorvastatin (LIPITOR) 20 MG tablet Take 20 mg by mouth daily. 02/03/21  Yes [provider]  cyanocobalamin 1000 MCG tablet Take by mouth.   Yes [provider]  hydrochlorothiazide (HYDRODIURIL) 25 MG tablet Take 25 mg by mouth daily. 05/30/21  Yes [provider]  lisinopril (ZESTRIL) 40 MG tablet Take 40 mg by  mouth daily. 05/04/21  Yes [provider]  rOPINIRole (REQUIP) 0.25 MG tablet Take 0.25 mg by mouth 3 (three) times daily. 07/28/20  Yes [provider]  vitamin C (ASCORBIC ACID) 500 MG tablet Take 500 mg by mouth daily.   Yes [provider]  zolpidem (AMBIEN) 10 MG tablet Take 10 mg by mouth at bedtime as needed. 06/07/21  Yes [provider]     Scheduled Meds:  artificial tears   Both Eyes Q8H   chlorhexidine gluconate (MEDLINE KIT)  15 mL Mouth Rinse BID   Chlorhexidine Gluconate Cloth  6 each Topical Q0600   feeding supplement (PROSource TF)  90 mL Per Tube QID   folic acid  1 mg Intravenous Daily   free water  30 mL Per Tube Q4H   heparin injection (subcutaneous)  5,000 Units Subcutaneous Q12H   lactulose  20 g Per Tube TID   mouth rinse  15 mL Mouth Rinse 10 times per day   multivitamin with minerals  1 tablet Per Tube Daily   pantoprazole sodium  40 mg Per Tube Daily   rifaximin  550 mg Per Tube BID   thiamine injection  100 mg Intravenous Q24H   Continuous Infusions:   prismasol BGK 4/2.5 500 mL/hr at 06/27/21 2153    prismasol BGK 4/2.5 500 mL/hr at 06/27/21 2152   sodium chloride Stopped (06/29/21 2223)   sodium chloride Stopped (06/26/21 1129)   feeding supplement (VITAL AF 1.2 CAL) 1,000 mL (06/29/21 2131)   piperacillin-tazobactam (ZOSYN)  IV 2.25 g (06/30/21 0507)   prismasol BGK 4/2.5 2,500 mL/hr at 06/28/21 2030   PRN Meds:.sodium chloride, acetaminophen **OR** acetaminophen, heparin, labetalol, ondansetron **OR** ondansetron (ZOFRAN) IV   Critical care time: 40 minutes    Darel Hong, AGACNP-BC [facility] Prefer epic messenger for cross cover needs If after hours, please call E-link

## 2021-06-30 NOTE — Procedures (Signed)
Routine EEG Report  Robert Coffey is a 51 y.o. male with a history of delirium tremens who is undergoing an EEG to evaluate for seizures.  Report: This EEG was acquired with electrodes placed according to the International 10-20 electrode system (including Fp1, Fp2, F3, F4, C3, C4, P3, P4, O1, O2, T3, T4, T5, T6, A1, A2, Fz, Cz, Pz). The following electrodes were missing or displaced: none.  The background was composed of low amplitude continuous slowing at 3-5 Hz. This activity is reactive to stimulation. There was no sleep architecture identified. There was no focal slowing. There were no interictal epileptiform discharges. There were no electrographic seizures identified. Photic stimulation and hyperventilation were not performed.   Impression and clinical correlation: This EEG was obtained while comatose and is abnormal due to severe diffuse slowing consistent with global cerebral dysfunction. Epileptiform abnormalities were not seen during this recording.  Su Monks, MD [facility] [phone]  If 7pm- 7am, please page neurology on call as listed in [place].

## 2021-06-30 NOTE — Progress Notes (Addendum)
[facility]  PROGRESS NOTE   Subjective:   Critically ill No sedation, responds to name Intubated with vent 28% FiO2 and 5 PEEP No pressors, sinus tachy  Tube feeds 22m/hr Foley-little urine output, bladder scan 217mFlexiseal in place Generalized edema  Objective:  Vital signs: Blood pressure 134/87, pulse (!) 106, temperature 99.3 F (37.4 C), resp. rate (!) 26, height _0  (1.905 m), weight (!) 148.4 kg, SpO2 98 %.  Intake/Output Summary (Last 24 hours) at 06/30/2021 1046 Last data filed at 06/30/2021 0000 Gross per 24 hour  Intake 99.08 ml  Output 1000 ml  Net -900.92 ml    Filed Weights   06/28/21 0331 06/29/21 0300 06/30/21 0320  Weight: (!) 151 kg (!) 148.4 kg (!) 148.4 kg     Physical Exam: General:  No acute distress  Head:  Normocephalic, atraumatic.  Eyes: Jaundiced  Lungs:  Rhonchi/wheeze throughout, normal effort  Heart:  S1S2 no rubs, tachy  Abdomen:   Soft, nontender, bowel sounds present  Extremities:  3+ peripheral edema.  Neurologic: Not following commands, responds to voice  Skin:  No lesions  Access: Rt IJ Temp cath    Basic Metabolic Panel: Recent Labs  Lab 06/26/21 0316 06/26/21 1425 06/27/21 0357 06/27/21 1631 06/28/21 0335 06/28/21 1524 06/29/21 0330 06/30/21 0420  NA 137 137   < > 139 139 139 140 142  K 3.5 3.2*   < > 3.3* 3.9 4.2 4.5 4.3  CL 104 104   < > 108 107 107 109 107  CO2 21* 22   < > 20* _1 20*  GLUCOSE 123* 125*   < > 143* 156* 141* 131* 151*  BUN 28* 31*   < > 38* 41* 38* 52* 90*  CREATININE 1.76* 1.93*   < > 2.15* 2.17* 2.19* 2.77* 6.04*  CALCIUM 9.2 9.3   < > 7.5* 7.6* 7.7* 7.3* 7.9*  MG 2.2 2.1  --  2.4  --  2.6*  --  3.2*  PHOS 2.4* 2.6   < > 1.5* 2.0* 2.9 3.2 5.3*   < > = values in this interval not displayed.     CBC: Recent Labs  Lab 06/26/21 0316 06/27/21 0357 06/28/21 0335 06/29/21 0330 06/30/21 0420  WBC 15.1* 14.1* 12.6* 13.9* 17.2*  HGB 13.9 14.0 13.8 13.9 15.2  HCT  40.5 41.6 40.8 41.7 44.6  MCV 111.3* 110.6* 111.5* 112.1* 111.8*  PLT 85* 87* 62* 62* 85*      Urinalysis: Recent Labs    06/29/21 0947  COLORURINE AMBER*  LABSPEC 1.032*  PHURINE 5.0  GLUCOSEU 50*  HGBUR LARGE*  BILIRUBINUR NEGATIVE  KETONESUR NEGATIVE  PROTEINUR 100*  NITRITE NEGATIVE  LEUKOCYTESUR NEGATIVE      Imaging: MR BRAIN WO CONTRAST  Result Date: 06/29/2021 CLINICAL DATA:  Mental status change, unknown cause. Acute metabolic encephalopathy. EXAM: MRI HEAD WITHOUT CONTRAST TECHNIQUE: Multiplanar, multiecho pulse sequences of the brain and surrounding structures were obtained without intravenous contrast. COMPARISON:  Head CT 06/26/2021 FINDINGS: Brain: There is no evidence of an acute infarct, intracranial hemorrhage, mass, midline shift, or extra-axial fluid collection. There is mild cerebral atrophy. The brain is normal in signal. Vascular: Major intracranial vascular flow voids are preserved. Skull and upper cervical spine: Heterogeneously diminished bone marrow T1 signal intensity in the skull base and included upper cervical spine, nonspecific though can be seen with anemia, smoking, obesity, and other chronic systemic disease. Sinuses/Orbits: Unremarkable orbits. Moderate volume fluid in the sphenoid  sinuses. Polypoid mucosal thickening or small mucous retention cysts in the maxillary sinuses. Bilateral mastoid and middle ear effusions. Other: None. IMPRESSION: 1. No acute intracranial abnormality. 2. Mild cerebral atrophy. Electronically Signed   By: Logan Bores M.D.   On: 06/29/2021 14:33   US Venous Img Lower Bilateral (DVT)  Result Date: 06/29/2021 CLINICAL DATA:  Bilateral lower extremity edema.  Evaluate for DVT. EXAM: BILATERAL LOWER EXTREMITY VENOUS DOPPLER ULTRASOUND TECHNIQUE: Gray-scale sonography with graded compression, as well as color Doppler and duplex ultrasound were performed to evaluate the lower extremity deep venous systems from the level of the  common femoral vein and including the common femoral, femoral, profunda femoral, popliteal and calf veins including the posterior tibial, peroneal and gastrocnemius veins when visible. The superficial great saphenous vein was also interrogated. Spectral Doppler was utilized to evaluate flow at rest and with distal augmentation maneuvers in the common femoral, femoral and popliteal veins. COMPARISON:  Bilateral lower extremity venous Doppler ultrasound-06/23/2021 (negative) FINDINGS: RIGHT LOWER EXTREMITY Common Femoral Vein: No evidence of thrombus. Normal compressibility, respiratory phasicity and response to augmentation. Saphenofemoral Junction: No evidence of thrombus. Normal compressibility and flow on color Doppler imaging. Profunda Femoral Vein: No evidence of thrombus. Normal compressibility and flow on color Doppler imaging. Femoral Vein: No evidence of thrombus. Normal compressibility, respiratory phasicity and response to augmentation. Popliteal Vein: No evidence of thrombus. Normal compressibility, respiratory phasicity and response to augmentation. Calf Veins: No evidence of thrombus. Normal compressibility and flow on color Doppler imaging. Superficial Great Saphenous Vein: No evidence of thrombus. Normal compressibility. Other Findings:  None. LEFT LOWER EXTREMITY Common Femoral Vein: No evidence of thrombus. Normal compressibility, respiratory phasicity and response to augmentation. Saphenofemoral Junction: No evidence of thrombus. Normal compressibility and flow on color Doppler imaging. Profunda Femoral Vein: No evidence of thrombus. Normal compressibility and flow on color Doppler imaging. Femoral Vein: No evidence of thrombus. Normal compressibility, respiratory phasicity and response to augmentation. Popliteal Vein: No evidence of thrombus. Normal compressibility, respiratory phasicity and response to augmentation. Calf Veins: No evidence of thrombus. Normal compressibility and flow on color  Doppler imaging. Superficial Great Saphenous Vein: No evidence of thrombus. Normal compressibility. Other Findings:  None. IMPRESSION: No evidence of DVT within either lower extremity. Electronically Signed   By: Sandi Mariscal M.D.   On: 06/29/2021 16:48   DG Chest Port 1 View  Result Date: 06/30/2021 CLINICAL DATA:  [id number].  Acute respiratory failure EXAM: PORTABLE CHEST 1 VIEW COMPARISON:  06/28/2021 FINDINGS: Endotracheal tube is seen 3.4 cm above the carina. Esophageal Doppler probe is seen within the distal esophagus. Nasogastric tube extends into the upper abdomen beyond the margin of the examination. Lung volumes are small and pulmonary insufflation is stable since prior examination. Left basilar atelectasis again seen. Right internal jugular hemodialysis catheter and left internal jugular central venous catheter have been removed. Cardiac size within normal limits. Pulmonary vascularity is normal. IMPRESSION: Interval removal of central venous catheters.  Stable support tubes. Stable pulmonary hypoinflation with left basilar atelectasis. Electronically Signed   By: Fidela Salisbury M.D.   On: 06/30/2021 02:28   EEG adult  Result Date: 06/30/2021 Derek Jack, MD     06/30/2021 10:03 AM Routine EEG Report Ndrew Creason is a 51 y.o. male with a history of delirium tremens who is undergoing an EEG to evaluate for seizures. Report: This EEG was acquired with electrodes placed according to the International 10-20 electrode system (including Fp1, Fp2, F3, F4, C3, C4, P3, P4,  O1, O2, T3, T4, T5, T6, A1, A2, Fz, Cz, Pz). The following electrodes were missing or displaced: none. The background was composed of low amplitude continuous slowing at 3-5 Hz. This activity is reactive to stimulation. There was no sleep architecture identified. There was no focal slowing. There were no interictal epileptiform discharges. There were no electrographic seizures identified. Photic stimulation and hyperventilation were  not performed. Impression and clinical correlation: This EEG was obtained while comatose and is abnormal due to severe diffuse slowing consistent with global cerebral dysfunction. Epileptiform abnormalities were not seen during this recording. Su Monks, MD [facility] [phone] If 7pm- 7am, please page neurology on call as listed in [place].   US Abdomen Limited RUQ (LIVER/GB)  Result Date: 06/30/2021 CLINICAL DATA:  Fever EXAM: ULTRASOUND ABDOMEN LIMITED RIGHT UPPER QUADRANT COMPARISON:  None Available. FINDINGS: Gallbladder: No gallstones or wall thickening visualized. No sonographic Murphy sign noted by sonographer. Common bile duct: Not visualized. Liver: No focal lesion identified. Increased parenchymal echogenicity. Portal vein is patent on color Doppler imaging with normal direction of blood flow towards the liver. Other: None. IMPRESSION: 1. Limited exam due to patient body habitus. Within limitations, normal appearance of the gallbladder. 2. Hepatic steatosis. 3. Common bile duct not visualized. Electronically Signed   By: Yetta Glassman M.D.   On: 06/30/2021 09:03     Medications:     prismasol BGK 4/2.5 500 mL/hr at 06/27/21 2153    prismasol BGK 4/2.5 500 mL/hr at 06/27/21 2152   sodium chloride Stopped (06/29/21 2223)   sodium chloride Stopped (06/26/21 1129)   feeding supplement (VITAL AF 1.2 CAL) 1,000 mL (06/29/21 2131)   piperacillin-tazobactam (ZOSYN)  IV 2.25 g (06/30/21 0507)   prismasol BGK 4/2.5 2,500 mL/hr at 06/28/21 2030    artificial tears   Both Eyes Q8H   chlorhexidine gluconate (MEDLINE KIT)  15 mL Mouth Rinse BID   Chlorhexidine Gluconate Cloth  6 each Topical Q0600   feeding supplement (PROSource TF)  90 mL Per Tube QID   folic acid  1 mg Intravenous Daily   free water  30 mL Per Tube Q4H   heparin injection (subcutaneous)  5,000 Units Subcutaneous Q12H   lactulose  20 g Per Tube TID   mouth rinse  15 mL Mouth Rinse 10 times per day    multivitamin with minerals  1 tablet Per Tube Daily   pantoprazole sodium  40 mg Per Tube Daily   rifaximin  550 mg Per Tube BID   thiamine injection  100 mg Intravenous Q24H    Assessment/ Plan:     Principal Problem:   Acute metabolic encephalopathy Active Problems:   Acute kidney injury ([place])   Hyponatremia   Alcohol withdrawal ([place])   Transaminitis   51 y.o.  male with past medical history including alcohol abuse, chronic back pain, and peripheral neuropathy, who was admitted to [facility] on 06/17/2021 for Hyponatremia, Elevated liver function tests. Generalized weakness, AKI (acute kidney injury) and Acute metabolic encephalopathy.    #1: Acute kidney injury due to ETOH abuse, poor nutrition and severe illness.  Placed on CRRT during this admission.   Planned to perform iHD today, but pateint was afebrile yesterday and all teams agreed to line holiday. Due to persistent oliguria, edema and elevated BUN, we will proceed with dialysis tomorrow. Appreciate critical care NP placing HD access this evening.    #2: Hypotension/shock: Remains off Levo.  BP 134/87   #3: Hyponatremia: Corrected    #4:  Acute respiratory failure with possible pneumonia: Remains intubated receiving antibiotics. Slowly weaning vent  #5: Transaminitis secondary to liver failure/shock liver: Most likely secondary to EtOH abuse.  Continue supportive care   LOS: [facility] 5/26/202310:46 AM

## 2021-07-01 DIAGNOSIS — G9341 Metabolic encephalopathy: Secondary | ICD-10-CM | POA: Diagnosis not present

## 2021-07-01 DIAGNOSIS — N179 Acute kidney failure, unspecified: Secondary | ICD-10-CM | POA: Diagnosis not present

## 2021-07-01 DIAGNOSIS — E871 Hypo-osmolality and hyponatremia: Secondary | ICD-10-CM | POA: Diagnosis not present

## 2021-07-01 DIAGNOSIS — R7401 Elevation of levels of liver transaminase levels: Secondary | ICD-10-CM | POA: Diagnosis not present

## 2021-07-01 LAB — GLUCOSE, CAPILLARY
Glucose-Capillary: 127 mg/dL — ABNORMAL HIGH (ref 70–99)
Glucose-Capillary: 145 mg/dL — ABNORMAL HIGH (ref 70–99)
Glucose-Capillary: 128 mg/dL — ABNORMAL HIGH (ref 70–99)
Glucose-Capillary: 136 mg/dL — ABNORMAL HIGH (ref 70–99)
Glucose-Capillary: 120 mg/dL — ABNORMAL HIGH (ref 70–99)
Glucose-Capillary: 124 mg/dL — ABNORMAL HIGH (ref 70–99)

## 2021-07-01 LAB — BASIC METABOLIC PANEL
Anion gap: 16 — ABNORMAL HIGH (ref 5–15)
BUN: 117 mg/dL — ABNORMAL HIGH (ref 6–20)
CO2: 20 mmol/L — ABNORMAL LOW (ref 22–32)
Calcium: 7.8 mg/dL — ABNORMAL LOW (ref 8.9–10.3)
Chloride: 106 mmol/L (ref 98–111)
Creatinine, Ser: 8.59 mg/dL — ABNORMAL HIGH (ref 0.61–1.24)
GFR, Estimated: 7 mL/min — ABNORMAL LOW (ref >60)
Glucose, Bld: 139 mg/dL — ABNORMAL HIGH (ref 70–99)
Potassium: 4.8 mmol/L (ref 3.5–5.1)
Sodium: 142 mmol/L (ref 135–145)

## 2021-07-01 LAB — CBC
HCT: 39.9 % (ref 39.0–52.0)
Hemoglobin: 13.3 g/dL (ref 13.0–17.0)
MCH: 37 pg — ABNORMAL HIGH (ref 26.0–34.0)
MCHC: 33.3 g/dL (ref 30.0–36.0)
MCV: 111.1 fL — ABNORMAL HIGH (ref 80.0–100.0)
Platelets: 89 10*3/uL — ABNORMAL LOW (ref 150–400)
RBC: 3.59 MIL/uL — ABNORMAL LOW (ref 4.22–5.81)
RDW: 21.2 % — ABNORMAL HIGH (ref 11.5–15.5)
WBC: 14.3 10*3/uL — ABNORMAL HIGH (ref 4.0–10.5)
nRBC: 2.5 % — ABNORMAL HIGH (ref 0.0–0.2)

## 2021-07-01 LAB — MAGNESIUM: Magnesium: 3.3 mg/dL — ABNORMAL HIGH (ref 1.7–2.4)

## 2021-07-01 LAB — CORTISOL-AM, BLOOD: Cortisol - AM: 23.7 ug/dL — ABNORMAL HIGH (ref 6.7–22.6)

## 2021-07-01 LAB — PHOSPHORUS: Phosphorus: 5.9 mg/dL — ABNORMAL HIGH (ref 2.5–4.6)

## 2021-07-01 MED ORDER — PENTAFLUOROPROP-TETRAFLUOROETH EX AERO: 1.0000 "application " | INHALATION_SPRAY | CUTANEOUS | Status: DC | PRN

## 2021-07-01 MED ORDER — SODIUM CHLORIDE 0.9 % IV SOLN: 100.0000 mL | INTRAVENOUS | Status: DC | PRN

## 2021-07-01 MED ORDER — LIDOCAINE HCL (PF) 1 % IJ SOLN: 5.0000 mL | INTRAMUSCULAR | Status: DC | PRN

## 2021-07-01 MED ORDER — ALTEPLASE 2 MG IJ SOLR: 2.0000 mg | Freq: Once | INTRAMUSCULAR | Status: DC | PRN

## 2021-07-01 MED ORDER — HEPARIN SODIUM (PORCINE) 1000 UNIT/ML IJ SOLN
INTRAMUSCULAR | Status: AC
  Administered 2021-07-01: 2800 [IU] via INTRAVENOUS_CENTRAL
  Filled 2021-07-01: qty 10

## 2021-07-01 MED ORDER — HEPARIN SODIUM (PORCINE) 1000 UNIT/ML DIALYSIS: 1000.0000 [IU] | INTRAMUSCULAR | Status: DC | PRN

## 2021-07-01 MED ORDER — LIDOCAINE-PRILOCAINE 2.5-2.5 % EX CREA: 1.0000 "application " | TOPICAL_CREAM | CUTANEOUS | Status: DC | PRN

## 2021-07-01 NOTE — Progress Notes (Signed)
[facility]  PROGRESS NOTE   Subjective:   Remains critically ill, febrile overnight Opens eyes to verbal stimulus Remains intubated with 30% FiO2 with 5 PEEP No pressors Tube feeds at 60 mL/h Flexi-Seal with liquid stool-1 L recorded day shift yesterday Foley catheter-no urine output recorded  Creatinine 8.59 BUN 117  Objective:  Vital signs: Blood pressure 107/60, pulse (!) 111, temperature (!) 101.8 F (38.8 C), resp. rate (!) 23, height $RemoveBe'6\' 3"'TWVkrOuGu$  (1.905 m), weight (!) 148.4 kg, SpO2 98 %.  Intake/Output Summary (Last 24 hours) at 07/01/2021 1057 Last data filed at 07/01/2021 0546 Gross per 24 hour  Intake 1987.41 ml  Output 1000 ml  Net 987.41 ml    Filed Weights   06/28/21 0331 06/29/21 0300 06/30/21 0320  Weight: (!) 151 kg (!) 148.4 kg (!) 148.4 kg     Physical Exam: General: Critically ill  Head:  Normocephalic, atraumatic.  Eyes: Jaundiced  Lungs:  Rhonchi/wheeze throughout, normal effort  Heart:  S1S2 no rubs, tachy  Abdomen:  Firm, nontender, bowel sounds present  Extremities:  3+ peripheral edema.  Neurologic: Not following commands, responds to voice  Skin:  No lesions  Access: Left femoral trialysis catheter placed on 2/33/4356    Basic Metabolic Panel: Recent Labs  Lab 06/26/21 1425 06/27/21 0357 06/27/21 1631 06/28/21 0335 06/28/21 1524 06/29/21 0330 06/30/21 0420 07/01/21 0524  NA 137   < > 139 139 139 140 142 142  K 3.2*   < > 3.3* 3.9 4.2 4.5 4.3 4.8  CL 104   < > 108 107 107 109 107 106  CO2 22   < > 20* $Rem'22 23 22 'OYoR$ 20* 20*  GLUCOSE 125*   < > 143* 156* 141* 131* 151* 139*  BUN 31*   < > 38* 41* 38* 52* 90* 117*  CREATININE 1.93*   < > 2.15* 2.17* 2.19* 2.77* 6.04* 8.59*  CALCIUM 9.3   < > 7.5* 7.6* 7.7* 7.3* 7.9* 7.8*  MG 2.1  --  2.4  --  2.6*  --  3.2* 3.3*  PHOS 2.6   < > 1.5* 2.0* 2.9 3.2 5.3* 5.9*   < > = values in this interval not displayed.     CBC: Recent Labs  Lab 06/27/21 0357 06/28/21 0335  06/29/21 0330 06/30/21 0420 07/01/21 0524  WBC 14.1* 12.6* 13.9* 17.2* 14.3*  HGB 14.0 13.8 13.9 15.2 13.3  HCT 41.6 40.8 41.7 44.6 39.9  MCV 110.6* 111.5* 112.1* 111.8* 111.1*  PLT 87* 62* 62* 85* 89*      Urinalysis: Recent Labs    06/29/21 0947  COLORURINE AMBER*  LABSPEC 1.032*  PHURINE 5.0  GLUCOSEU 50*  HGBUR LARGE*  BILIRUBINUR NEGATIVE  KETONESUR NEGATIVE  PROTEINUR 100*  NITRITE NEGATIVE  LEUKOCYTESUR NEGATIVE       Imaging: MR BRAIN WO CONTRAST  Result Date: 06/29/2021 CLINICAL DATA:  Mental status change, unknown cause. Acute metabolic encephalopathy. EXAM: MRI HEAD WITHOUT CONTRAST TECHNIQUE: Multiplanar, multiecho pulse sequences of the brain and surrounding structures were obtained without intravenous contrast. COMPARISON:  Head CT 06/26/2021 FINDINGS: Brain: There is no evidence of an acute infarct, intracranial hemorrhage, mass, midline shift, or extra-axial fluid collection. There is mild cerebral atrophy. The brain is normal in signal. Vascular: Major intracranial vascular flow voids are preserved. Skull and upper cervical spine: Heterogeneously diminished bone marrow T1 signal intensity in the skull base and included upper cervical spine, nonspecific though can be seen with anemia, smoking, obesity, and other  chronic systemic disease. Sinuses/Orbits: Unremarkable orbits. Moderate volume fluid in the sphenoid sinuses. Polypoid mucosal thickening or small mucous retention cysts in the maxillary sinuses. Bilateral mastoid and middle ear effusions. Other: None. IMPRESSION: 1. No acute intracranial abnormality. 2. Mild cerebral atrophy. Electronically Signed   By: Logan Bores M.D.   On: 06/29/2021 14:33   US Venous Img Lower Bilateral (DVT)  Result Date: 06/29/2021 CLINICAL DATA:  Bilateral lower extremity edema.  Evaluate for DVT. EXAM: BILATERAL LOWER EXTREMITY VENOUS DOPPLER ULTRASOUND TECHNIQUE: Gray-scale sonography with graded compression, as well as color  Doppler and duplex ultrasound were performed to evaluate the lower extremity deep venous systems from the level of the common femoral vein and including the common femoral, femoral, profunda femoral, popliteal and calf veins including the posterior tibial, peroneal and gastrocnemius veins when visible. The superficial great saphenous vein was also interrogated. Spectral Doppler was utilized to evaluate flow at rest and with distal augmentation maneuvers in the common femoral, femoral and popliteal veins. COMPARISON:  Bilateral lower extremity venous Doppler ultrasound-06/23/2021 (negative) FINDINGS: RIGHT LOWER EXTREMITY Common Femoral Vein: No evidence of thrombus. Normal compressibility, respiratory phasicity and response to augmentation. Saphenofemoral Junction: No evidence of thrombus. Normal compressibility and flow on color Doppler imaging. Profunda Femoral Vein: No evidence of thrombus. Normal compressibility and flow on color Doppler imaging. Femoral Vein: No evidence of thrombus. Normal compressibility, respiratory phasicity and response to augmentation. Popliteal Vein: No evidence of thrombus. Normal compressibility, respiratory phasicity and response to augmentation. Calf Veins: No evidence of thrombus. Normal compressibility and flow on color Doppler imaging. Superficial Great Saphenous Vein: No evidence of thrombus. Normal compressibility. Other Findings:  None. LEFT LOWER EXTREMITY Common Femoral Vein: No evidence of thrombus. Normal compressibility, respiratory phasicity and response to augmentation. Saphenofemoral Junction: No evidence of thrombus. Normal compressibility and flow on color Doppler imaging. Profunda Femoral Vein: No evidence of thrombus. Normal compressibility and flow on color Doppler imaging. Femoral Vein: No evidence of thrombus. Normal compressibility, respiratory phasicity and response to augmentation. Popliteal Vein: No evidence of thrombus. Normal compressibility, respiratory  phasicity and response to augmentation. Calf Veins: No evidence of thrombus. Normal compressibility and flow on color Doppler imaging. Superficial Great Saphenous Vein: No evidence of thrombus. Normal compressibility. Other Findings:  None. IMPRESSION: No evidence of DVT within either lower extremity. Electronically Signed   By: Sandi Mariscal M.D.   On: 06/29/2021 16:48   DG Chest Port 1 View  Result Date: 06/30/2021 CLINICAL DATA:  [id number].  Acute respiratory failure EXAM: PORTABLE CHEST 1 VIEW COMPARISON:  06/28/2021 FINDINGS: Endotracheal tube is seen 3.4 cm above the carina. Esophageal Doppler probe is seen within the distal esophagus. Nasogastric tube extends into the upper abdomen beyond the margin of the examination. Lung volumes are small and pulmonary insufflation is stable since prior examination. Left basilar atelectasis again seen. Right internal jugular hemodialysis catheter and left internal jugular central venous catheter have been removed. Cardiac size within normal limits. Pulmonary vascularity is normal. IMPRESSION: Interval removal of central venous catheters.  Stable support tubes. Stable pulmonary hypoinflation with left basilar atelectasis. Electronically Signed   By: Fidela Salisbury M.D.   On: 06/30/2021 02:28   EEG adult  Result Date: 06/30/2021 Derek Jack, MD     06/30/2021 10:03 AM Routine EEG Report Jabre Heo is a 51 y.o. male with a history of delirium tremens who is undergoing an EEG to evaluate for seizures. Report: This EEG was acquired with electrodes placed according to the International  10-20 electrode system (including Fp1, Fp2, F3, F4, C3, C4, P3, P4, O1, O2, T3, T4, T5, T6, A1, A2, Fz, Cz, Pz). The following electrodes were missing or displaced: none. The background was composed of low amplitude continuous slowing at 3-5 Hz. This activity is reactive to stimulation. There was no sleep architecture identified. There was no focal slowing. There were no interictal  epileptiform discharges. There were no electrographic seizures identified. Photic stimulation and hyperventilation were not performed. Impression and clinical correlation: This EEG was obtained while comatose and is abnormal due to severe diffuse slowing consistent with global cerebral dysfunction. Epileptiform abnormalities were not seen during this recording. Su Monks, MD [facility] [phone] If 7pm- 7am, please page neurology on call as listed in [place].   US Abdomen Limited RUQ (LIVER/GB)  Result Date: 06/30/2021 CLINICAL DATA:  Fever EXAM: ULTRASOUND ABDOMEN LIMITED RIGHT UPPER QUADRANT COMPARISON:  None Available. FINDINGS: Gallbladder: No gallstones or wall thickening visualized. No sonographic Murphy sign noted by sonographer. Common bile duct: Not visualized. Liver: No focal lesion identified. Increased parenchymal echogenicity. Portal vein is patent on color Doppler imaging with normal direction of blood flow towards the liver. Other: None. IMPRESSION: 1. Limited exam due to patient body habitus. Within limitations, normal appearance of the gallbladder. 2. Hepatic steatosis. 3. Common bile duct not visualized. Electronically Signed   By: Yetta Glassman M.D.   On: 06/30/2021 09:03     Medications:     prismasol BGK 4/2.5 500 mL/hr at 06/27/21 2153    prismasol BGK 4/2.5 500 mL/hr at 06/27/21 2152   sodium chloride Stopped (06/29/21 2223)   sodium chloride 10 mL/hr at 07/01/21 0546   feeding supplement (VITAL AF 1.2 CAL) 60 mL/hr at 07/01/21 0400   piperacillin-tazobactam (ZOSYN)  IV 2.25 g (07/01/21 0657)   prismasol BGK 4/2.5 2,500 mL/hr at 06/28/21 2030    artificial tears   Both Eyes Q8H   chlorhexidine gluconate (MEDLINE KIT)  15 mL Mouth Rinse BID   Chlorhexidine Gluconate Cloth  6 each Topical Q0600   feeding supplement (PROSource TF)  90 mL Per Tube QID   folic acid  1 mg Intravenous Daily   free water  30 mL Per Tube Q4H   heparin injection (subcutaneous)   5,000 Units Subcutaneous Q12H   lactulose  20 g Per Tube TID   mouth rinse  15 mL Mouth Rinse 10 times per day   multivitamin with minerals  1 tablet Per Tube Daily   pantoprazole sodium  40 mg Per Tube Daily   rifaximin  550 mg Per Tube BID   thiamine injection  100 mg Intravenous Q24H    Assessment/ Plan:     Principal Problem:   Acute metabolic encephalopathy Active Problems:   Acute kidney injury ([place])   Hyponatremia   Alcohol withdrawal ([place])   Transaminitis   51 y.o.  male with past medical history including alcohol abuse, chronic back pain, and peripheral neuropathy, who was admitted to [facility] on 06/13/2021 for Hyponatremia, Elevated liver function tests. Generalized weakness, AKI (acute kidney injury) and Acute metabolic encephalopathy.    #1: Acute kidney injury due to ETOH abuse, poor nutrition and severe illness.  Placed on CRRT during this admission.   Creatinine continues to worsen as no urine output recorded.  Appreciate ICU team placing trialysis catheter yesterday evening.  Plan to perform dialysis treatment today, gentle UF.  Next treatment scheduled for Monday.   #2: Hypotension/shock: No pressors.  Blood pressure 107/60   #  3: Hyponatremia: Corrected previous treatment   #4: Acute respiratory failure with possible pneumonia: Intubated with antibiotic therapy.  ID following  #5: Transaminitis secondary to liver failure/shock liver: Most likely secondary to EtOH abuse.  Supportive care   LOS: [facility] 5/27/202310:57 AM

## 2021-07-01 NOTE — Progress Notes (Signed)
Hemodialysis Post Treatment Note:  Tx date:07/01/2021 Tx time: 2 hours and 30 minutes Access: Left femoral catheter UF Removed: 500 ml  Note:  UF net order is 0.5 to 1 L as tolerated  Pre HD BP 100/58  15:12 HD started with a UF net goal of 1 L  16:15 BP is 87/57 UF goal net decreased to 0.5 L  17:43 HD completed. Post HD BP 100/60

## 2021-07-01 NOTE — Procedures (Deleted)
Hemodialysis Catheter Insertion Procedure Note Robert Coffey [id number] 05/12/1970  Procedure: Insertion of Hemodialysis Catheter Indications: Hemodialysis  Procedure Details Consent: Risks of procedure as well as the alternatives and risks of each were explained to the (patient/caregiver).  Consent for procedure obtained.  Time Out: Verified patient identification, verified procedure, site/side was marked, verified correct patient position, special equipment/implants available, medications/allergies/relevent history reviewed, required imaging and test results available.  Performed  Maximum sterile technique was used including antiseptics, cap, gloves, gown, hand hygiene, mask, and sheet.  Skin prep: Chlorhexidine; local anesthetic administered  A Trialysis HD catheter was placed in the left femoral vein due to patient being a dialysis patient using the Seldinger technique.  Evaluation Blood flow good Complications: No apparent complications Patient did tolerate procedure well. Chest X-ray ordered to verify placement.  CXR:  Not needed for femoral line .   Line secured at the 20 cm mark. BIOPATCH applied to the insertion site.   Procedure performed under direct supervision of Dr. Merrilee Jansky and with ultrasound guidance for real time vessel cannulation.       Robert Coffey, AGACNP-BC [facility] Prefer epic messenger for cross cover needs If after hours, please call E-link  07/01/2021, 2:19 PM

## 2021-07-01 NOTE — Progress Notes (Signed)
NAME:  Robert Coffey, MRN:  [id number], DOB:  [birth year], LOS: 4 ADMISSION DATE:  06/23/2021, CONSULTATION DATE:  06/22/2021 REFERRING MD:  Dr. Jimmye Norman, CHIEF COMPLAINT:  Acute Respiratory Distress, AMS   Brief Pt Description / Synopsis:  51 year old male admitted with acute metabolic encephalopathy secondary to alcohol withdrawal and severe hyponatremia requiring hypertonic saline.  On 5/18 developed acute hypoxic & hypercapnic respiratory failure requiring intubation and mechanical ventilation.  History of Present Illness:  Robert Coffey is a 51 year old male with a past medical history significant for alcohol abuse, obesity, chronic back pain, peripheral neuropathy who presented to [facility] ED on 06/27/2021 due to altered mental status, poor p.o. intake, progressive weakness with difficulty getting around at home.  Patient is now intubated and unable to contribute to history, therefore history is obtained from patient's wife at bedside and chart review.  Per the patient's wife, the patient has not been feeling well with poor p.o. intake for approximately a month, however has continued to drink alcohol daily (usually 3-4 drinks of liquor) during that time.  Over the past several days he has become progressively weak with difficulty getting around at home (not even in bed able to get out of bed), intermittent confusion, and diffuse abdominal cramping/nausea/vomiting/diarrhea.  Denies any history of falls, chest pain, shortness of breath, fever, cough.  Last known alcoholic drink was the day prior to admission on Sunday.  ED Course: Initial Vital Signs: Temperature 98.1 F orally, blood pressure 136/119, respiratory rate 18, pulse 106, SPO2 95% Significant Labs: Sodium 109, chloride 69, glucose 108, BUN 8, creatinine 1.62, anion gap 17, albumin 3.1, lipase 86, AST 256, ALT 119, total bilirubin 5.0, high-sensitivity troponin 67, serum osmolality 236, urine osmolality 437, urine sodium less than 10 WBC  11.0, platelets 129, TSH 2.1 COVID-19 and influenza PCR negative Urinalysis negative for UTI Urine drug screen positive for tricyclics Ethyl alcohol less than 10 Imaging CT head without contrast>>IMPRESSION: 1. Fluid or debris in the left sinus tympani/middle ear, cannot exclude otitis media. There small bilateral mastoid effusions and chronic bilateral maxillary sinusitis. 2. Advanced for age cerebral atrophy. 3. Atherosclerosis. CT abdomen and pelvis>>IMPRESSION: Hepatic steatosis. 2.6 cm enhancing left adrenal nodule is noted. When the patient is clinically stable and able to follow directions and hold their breath (preferably as an outpatient) further evaluation with dedicated abdominal MRI should be considered. Medications Administered: 1 L normal saline bolus, hypertonic saline infusion  He was admitted by the hospitalist for further work-up and treatment of acute metabolic encephalopathy in the setting of DTs and severe hyponatremia.  Please see "significant hospital events" section below for full detailed hospital course.  Pertinent  Medical History  Alcohol abuse Obesity Chronic back  Micro Data:  5/16: SARS-CoV-2 and influenza PCR>> negative 5/16: HIV screen>> nonreactive 5/16: MRSA PCR>> negative 5/18: Tracheal aspirate>>normal respiratory flora 5/19: Blood x2>>negative 5/25: Tracheal aspirate>>rare gram - rods, rare yeast with pseudohyphae 5/25: MRSA PCR>> negative  Antimicrobials:  5/18: Unasyn>>x2 doses, restarted 5/22>>5/24 5/18: Cefepime>>5/19 5/18: Vancomycin>>5/19 5/18: Flagyl>>5/21 5/25: Zosyn>>  Significant Hospital Events: Including procedures, antibiotic start and stop dates in addition to other pertinent events   5/16: Admitted by hospitalist for acute metabolic encephalopathy in setting of severe hyponatremia and DTs.  Requiring hypertonic saline 5/18: Developed acute hypoxic respiratory failure, possible concern for aspiration.  Required  intubation and mechanical ventilation. 5/19: Pt with worsening acute renal failure with severe metabolic and lactic acidosis requiring CRRT.  Severely hypotensive requiring epinephrine, levophed, and vasopressin gtts to  maintain map >65  5/20: Remains critically ill, on CRRT. Vent support slightly weaned to 60% FiO2 & 10 PEEP. Some improvement in vasopressor requirements (off of epinephrine and phenylephrine) 5/21: Overnight weaned off of Giapreza, currently only on norepinephrine and vasopressin 5/22: No acute events overnight on minimal ventilator settings FiO2 30% PEEP 8.  Remains on levophed gtt to maintain map >65.  Will perform WUA  5/22: CT Head revealed No acute intracranial findings. No        change from CT 06/19/2021. 5/24: Transitioned off CRRT 5/25: Neuro exam remains poor off sedation, plan for MRI Brain.  New fever, obtain UA/TA/RUQ [place] & Doppler LE, line holiday (central line, trialysis, and A-line all removed). Empiric Zosyn started.  Neurology and ID consulted 5/26: Neuro exam slightly improving.  Fever resolved.  Plan to place new temporary HD catheter per Nephrology request.  Interim History / Subjective:  -No significant events overnight -Fever resolved overnight without administration of tylenol  -Repeat tracheal aspirate preliminary results with rate gram - rods & rare yeast ~ ID following, continue empiric Zosyn for now -Leukocytosis slightly worsened to 17.2 K (13.9 K) -Hemodynamically stable, remains off vasopressors -Minimal ventilator settings FiO2 30% PEEP 5 ~ will exercise in PSV as tolerated -MRI Brain negative yesterday ~ pt opens eyes to voice and wiggles toes, tries to move UE but extremely weak   Objective   Blood pressure (!) 99/56, pulse (!) 110, temperature (!) 101.3 F (38.5 C), resp. rate (!) 23, height $RemoveBe'6\' 3"'gNFhdijiv$  (1.905 m), weight (!) 148.4 kg, SpO2 95 %.    Vent Mode: PRVC FiO2 (%):  [30 %] 30 % Set Rate:  [22 bmp] 22 bmp Vt Set:  [650 mL] 650 mL PEEP:   [5 cmH20] 5 cmH20 Pressure Support:  [14 cmH20] 14 cmH20 Plateau Pressure:  [23 cmH20] 23 cmH20   Intake/Output Summary (Last 24 hours) at 07/01/2021 1446 Last data filed at 07/01/2021 0546 Gross per 24 hour  Intake 1987.41 ml  Output 1000 ml  Net 987.41 ml    Filed Weights   06/28/21 0331 06/29/21 0300 06/30/21 0320  Weight: (!) 151 kg (!) 148.4 kg (!) 148.4 kg   Examination: General: Acutely ill-appearing obese male, laying in bed, NAD, on NO sedation, mechanically intubated  HENT: Atraumatic, normocephalic, neck supple, difficult to assess JVD due to body habitus Lungs: Coarse breath sounds throughout, even, non labored, synchronous with vent  Cardiovascular: Tachycardia, Regular rhythm,  no murmurs, rubs, gallops, 1+ generalized edema  Abdomen: +BS x4, obese, soft, non distended  Extremities: Normal bulk and tone Neuro: Off sedation, opens eyes to verbal stimulation, wiggles toes bilaterally (attempting to move UE but extremely weak), PERRL, scleredema  GU: Indwelling foley catheter draining yellow urine   Imaging     Assessment & Plan:   Acute hypoxic & hypercapnic respiratory failure in the setting of suspected aspiration in the setting of severe DTs Pulmonary Embolism - cannot r/o  -Full vent support, on lung protective strategies -Plateau pressures less than 30 cm H20 -Wean FiO2 as tolerated to maintain O2 sats >92% -Maintain PEEP  for left base atelectasis -Follow intermittent Chest X-ray & ABG as needed -Spontaneous Breathing Trials when respiratory parameters met and mental status permits -Continue VAP Bundle -PRN Bronchodilators -Venous US Bilateral LE negative for DVT x2  Septic shock~RESOLVED Elevated troponin suspect secondary to demand ischemia -Continuous cardiac monitoring -Maintain MAP >65 -Vasopressors as needed to maintain MAP goal -D/c Midodrine 5/25 -HS Troponin peaked at 67 -Echocardiogram  06/23/21: LVEF 60-65%, Grade I DD, normal RV systolic  function  Severe Sepsis due to Suspected aspiration~TREATED CT head 5/16 unable to exclude left otitis media and chronic bilateral maxillary sinusitis New Fever 5/25 ~ RESOLVED Temp (24hrs), Avg:100.8 F (38.2 C), Min:97.9 F (36.6 C), Max:101.8 F (38.8 C)  Lab Results  Component Value Date   WBC 14.3 (H) 07/01/2021   -Monitor fever curve -Trend WBC's & Procalcitonin -Follow cultures as above -Completed course of Unasyn  - ID following, continue empiric Zosyn for now as per ID -Line holiday given on 5/25  Hypotonic hyponatremia, suspect secondary to dehydration & poor p.o. intake~RESOLVED Acute kidney injury secondary to ATN with severe metabolic acidosis  Lactic acidosis persistent likely due to hepatic impairment Lab Results  Component Value Date   CREATININE 8.59 (H) 07/01/2021   BUN 117 (H) 07/01/2021   NA 142 07/01/2021   K 4.8 07/01/2021   CL 106 07/01/2021   CO2 20 (L) 07/01/2021   -Trend BMP, lactic acid, and ABG -Creatinine remains elevated but K and bicarb are normalized on HD -Persistent lactic acid elevation likely due to hepatic dysfunction, will not follow daily -Replace electrolytes as indicated  -Monitor UOP -Nephrology following, appreciate input: Transitioned off CRRT to intermittent HD 5/24  Transaminitis secondary to hepatic steatosis & ETOH abuse & Shock liver Lab Results  Component Value Date   AST 486 (H) 06/29/2021   AST 665 (H) 06/26/2021   AST 1,000 (H) 06/25/2021   -Trend LFTs, falling  Thrombocytopenia, suspect secondary to alcohol abuse Lab Results  Component Value Date   WBC 14.3 (H) 07/01/2021   HGB 13.3 07/01/2021   HCT 39.9 07/01/2021   MCV 111.1 (H) 07/01/2021   PLT 89 (L) 07/01/2021   -Trend CBC -Monitor for s/sx of bleeding and transfuse for Hgb <7 -Transfuse platelets for platelet count less than 50 with active bleeding  Incidental finding of left adrenal nodule via CT Abd/Pelvis  -Will need abdominal MRI for further  evaluation in the outpatient setting   Acute metabolic encephalopathy in the setting of severe DTs and severe hyponatremia Sedation needs in the setting of mechanical ventilation -Maintain a RASS goal of 0 to -1 -Fentanyl and versed as needed to maintain RASS goal -Avoid sedating medications as able -Daily wake up assessment -Continue thiamine  -Continue folic acid and multivitamin -Continue CIWA protocol -Continue lactulose and rifaximin trend ammonia level  -MRI Brain 5/25 negative -Neurology consulted, appreciate input -EEG negative for seizures  Morbid obesity with sarcopenia Protein malnutrition -Calorie excess, protein deficiency in the setting of alcoholism -Dietitian consulted for initiation of TF's  Best Practice (right click and "Reselect all SmartList Selections" daily)  Diet/type: TF's DVT prophylaxis: Subq heparin  GI prophylaxis: PPI Lines: N/A Foley:  N/A Code Status: DNR  Last date of multidisciplinary goals of care discussion [06/30/21]  Update pts wife 5/26 at bedside.  All questions answered to her satisfaction.  Labs   CBC: Recent Labs  Lab 06/27/21 0357 06/28/21 0335 06/29/21 0330 06/30/21 0420 07/01/21 0524  WBC 14.1* 12.6* 13.9* 17.2* 14.3*  HGB 14.0 13.8 13.9 15.2 13.3  HCT 41.6 40.8 41.7 44.6 39.9  MCV 110.6* 111.5* 112.1* 111.8* 111.1*  PLT 87* 62* 62* 85* 89*     Basic Metabolic Panel: Recent Labs  Lab 06/26/21 1425 06/27/21 0357 06/27/21 1631 06/28/21 0335 06/28/21 1524 06/29/21 0330 06/30/21 0420 07/01/21 0524  NA 137   < > 139 139 139 140 142 142  K 3.2*   < >  3.3* 3.9 4.2 4.5 4.3 4.8  CL 104   < > 108 107 107 109 107 106  CO2 22   < > 20* 22 23 22  20* 20*  GLUCOSE 125*   < > 143* 156* 141* 131* 151* 139*  BUN 31*   < > 38* 41* 38* 52* 90* 117*  CREATININE 1.93*   < > 2.15* 2.17* 2.19* 2.77* 6.04* 8.59*  CALCIUM 9.3   < > 7.5* 7.6* 7.7* 7.3* 7.9* 7.8*  MG 2.1  --  2.4  --  2.6*  --  3.2* 3.3*  PHOS 2.6   < > 1.5* 2.0*  2.9 3.2 5.3* 5.9*   < > = values in this interval not displayed.    GFR: Estimated Creatinine Clearance: 16.4 mL/min (A) (by C-G formula based on SCr of 8.59 mg/dL (H)). Recent Labs  Lab 06/25/21 0320 06/25/21 0332 06/26/21 0316 06/27/21 0357 06/27/21 1001 06/27/21 2219 06/28/21 0335 06/29/21 0330 06/30/21 0420 07/01/21 0524  PROCALCITON 5.62  --   --   --   --   --   --   --   --   --   WBC 16.3*  --  15.1*   < >  --   --  12.6* 13.9* 17.2* 14.3*  LATICACIDVEN  --  4.1* 3.3*  --  3.3* 3.0*  --   --   --   --    < > = values in this interval not displayed.     Liver Function Tests: Recent Labs  Lab 06/25/21 0320 06/25/21 0332 06/26/21 0332 06/26/21 1425 06/27/21 0357 06/27/21 1631 06/28/21 0335 06/28/21 1524 06/29/21 0330  AST 1,000*  --  665*  --   --   --   --   --  486*  ALT 289*  --  251*  --   --   --   --   --  229*  ALKPHOS 125  --  110  --   --   --   --   --  110  BILITOT 7.9*  --  7.2*  --   --   --   --   --  7.2*  PROT 5.8*  --  5.5*  --   --   --   --   --  5.2*  ALBUMIN 2.3*   < > 2.1*   < > 2.0* 1.8* 1.9* 2.1* 1.9*  1.9*   < > = values in this interval not displayed.    No results for input(s): LIPASE, AMYLASE in the last 168 hours.  Recent Labs  Lab 06/25/21 0332 06/27/21 0357  AMMONIA 85* 59*     ABG    Component Value Date/Time   PHART 7.41 06/29/2021 1130   PCO2ART 32 06/29/2021 1130   PO2ART 94 06/29/2021 1130   HCO3 20.3 06/29/2021 1130   ACIDBASEDEF 3.3 (H) 06/29/2021 1130   O2SAT 98.3 06/29/2021 1130      Coagulation Profile: No results for input(s): INR, PROTIME in the last 168 hours.   Cardiac Enzymes: Recent Labs  Lab 06/30/21 1956  CKTOTAL 347    HbA1C: No results found for: HGBA1C  CBG: Recent Labs  Lab 06/30/21 1936 06/30/21 2312 07/01/21 0312 07/01/21 0741 07/01/21 1200  GLUCAP 135* 115* 127* 145* 128*     Review of Systems:   Unable to assess due to intubation/sedation/critical  illness  Allergies Allergies  Allergen Reactions   Sulfa Antibiotics      Home  Medications  Prior to Admission medications   Medication Sig Start Date End Date Taking? Authorizing Provider  atorvastatin (LIPITOR) 20 MG tablet Take 20 mg by mouth daily. 02/03/21  Yes [provider]  cyanocobalamin 1000 MCG tablet Take by mouth.   Yes [provider]  hydrochlorothiazide (HYDRODIURIL) 25 MG tablet Take 25 mg by mouth daily. 05/30/21  Yes [provider]  lisinopril (ZESTRIL) 40 MG tablet Take 40 mg by mouth daily. 05/04/21  Yes [provider]  rOPINIRole (REQUIP) 0.25 MG tablet Take 0.25 mg by mouth 3 (three) times daily. 07/28/20  Yes [provider]  vitamin C (ASCORBIC ACID) 500 MG tablet Take 500 mg by mouth daily.   Yes [provider]  zolpidem (AMBIEN) 10 MG tablet Take 10 mg by mouth at bedtime as needed. 06/07/21  Yes [provider]     Scheduled Meds:  heparin sodium (porcine)       artificial tears   Both Eyes Q8H   chlorhexidine gluconate (MEDLINE KIT)  15 mL Mouth Rinse BID   Chlorhexidine Gluconate Cloth  6 each Topical Q0600   feeding supplement (PROSource TF)  90 mL Per Tube QID   folic acid  1 mg Intravenous Daily   free water  30 mL Per Tube Q4H   heparin injection (subcutaneous)  5,000 Units Subcutaneous Q12H   lactulose  20 g Per Tube TID   mouth rinse  15 mL Mouth Rinse 10 times per day   multivitamin with minerals  1 tablet Per Tube Daily   pantoprazole sodium  40 mg Per Tube Daily   rifaximin  550 mg Per Tube BID   thiamine injection  100 mg Intravenous Q24H   Continuous Infusions:   prismasol BGK 4/2.5 500 mL/hr at 06/27/21 2153    prismasol BGK 4/2.5 500 mL/hr at 06/27/21 2152   sodium chloride Stopped (06/29/21 2223)   sodium chloride 10 mL/hr at 07/01/21 0546   sodium chloride     sodium chloride     feeding supplement (VITAL AF 1.2 CAL) 60 mL/hr at 07/01/21 0400   piperacillin-tazobactam  (ZOSYN)  IV 2.25 g (07/01/21 0657)   prismasol BGK 4/2.5 2,500 mL/hr at 06/28/21 2030   PRN Meds:.sodium chloride, sodium chloride, sodium chloride, acetaminophen **OR** acetaminophen, alteplase, heparin, heparin, labetalol, lidocaine (PF), lidocaine-prilocaine, ondansetron **OR** ondansetron (ZOFRAN) IV, pentafluoroprop-tetrafluoroeth   Critical care time: 40 minutes

## 2021-07-01 NOTE — Consult Note (Signed)
[facility] for Electrolyte Monitoring and Replacement   Recent Labs: Potassium (mmol/L)  Date Value  07/01/2021 4.8   Magnesium (mg/dL)  Date Value  07/01/2021 3.3 (H)   Calcium (mg/dL)  Date Value  07/01/2021 7.8 (L)   Albumin (g/dL)  Date Value  06/29/2021 1.9 (L)  06/29/2021 1.9 (L)   Phosphorus (mg/dL)  Date Value  07/01/2021 5.9 (H)   Sodium (mmol/L)  Date Value  07/01/2021 142   Assessment: Patient is a 50 y/o M with medical history including obesity, chronic back pain, EtOH use disorder who presented to the ED 5/16 with weakness in setting of abdominal cramps, nausea, vomiting, and diarrhea. Patient subsequently admitted for severe symptomatic hyponatremia. Patient decompensated on 5/18 with acute onset respiratory failure requiring intubation. Patient remains admitted to the ICU where he is intubated, sedated, and on mechanical ventilation. There is further concern for septic shock and patient is requiring multiple vasopressor infusions. Admission further complicated by acute renal failure necessitating initiation of CRRT on 5/19. Pharmacy consulted to assist with electrolyte monitoring and replacement as indicated.  Nutrition: Tube feeds initiated 5/22 Cathartics: Lactulose 20 g TID for hepatic encephalopathy  CRRT discontinued 5/24 evening per nephrology. Anticipate transition to iHD - first session tentatively 5/26  Goal of Therapy:  Electrolytes within normal limits  Plan:  --No electrolyte replacement indicated at this time --Follow-up electrolytes with AM labs tomorrow  Pearla Dubonnet 07/01/2021 8:05 AM

## 2021-07-02 DIAGNOSIS — R7401 Elevation of levels of liver transaminase levels: Secondary | ICD-10-CM | POA: Diagnosis not present

## 2021-07-02 DIAGNOSIS — N179 Acute kidney failure, unspecified: Secondary | ICD-10-CM | POA: Diagnosis not present

## 2021-07-02 DIAGNOSIS — G9341 Metabolic encephalopathy: Secondary | ICD-10-CM | POA: Diagnosis not present

## 2021-07-02 LAB — GLUCOSE, CAPILLARY
Glucose-Capillary: 128 mg/dL — ABNORMAL HIGH (ref 70–99)
Glucose-Capillary: 132 mg/dL — ABNORMAL HIGH (ref 70–99)
Glucose-Capillary: 124 mg/dL — ABNORMAL HIGH (ref 70–99)
Glucose-Capillary: 146 mg/dL — ABNORMAL HIGH (ref 70–99)
Glucose-Capillary: 127 mg/dL — ABNORMAL HIGH (ref 70–99)
Glucose-Capillary: 120 mg/dL — ABNORMAL HIGH (ref 70–99)

## 2021-07-02 LAB — BASIC METABOLIC PANEL
Anion gap: 16 — ABNORMAL HIGH (ref 5–15)
BUN: 121 mg/dL — ABNORMAL HIGH (ref 6–20)
CO2: 20 mmol/L — ABNORMAL LOW (ref 22–32)
Calcium: 7.6 mg/dL — ABNORMAL LOW (ref 8.9–10.3)
Chloride: 103 mmol/L (ref 98–111)
Creatinine, Ser: 8.45 mg/dL — ABNORMAL HIGH (ref 0.61–1.24)
GFR, Estimated: 7 mL/min — ABNORMAL LOW (ref >60)
Glucose, Bld: 121 mg/dL — ABNORMAL HIGH (ref 70–99)
Potassium: 4.4 mmol/L (ref 3.5–5.1)
Sodium: 139 mmol/L (ref 135–145)

## 2021-07-02 LAB — HEPATIC FUNCTION PANEL
ALT: 247 U/L — ABNORMAL HIGH (ref 0–44)
AST: 427 U/L — ABNORMAL HIGH (ref 15–41)
Albumin: 1.8 g/dL — ABNORMAL LOW (ref 3.5–5.0)
Alkaline Phosphatase: 125 U/L (ref 38–126)
Bilirubin, Direct: 2.5 mg/dL — ABNORMAL HIGH (ref 0.0–0.2)
Indirect Bilirubin: 2.1 mg/dL — ABNORMAL HIGH (ref 0.3–0.9)
Total Bilirubin: 4.6 mg/dL — ABNORMAL HIGH (ref 0.3–1.2)
Total Protein: 5.5 g/dL — ABNORMAL LOW (ref 6.5–8.1)

## 2021-07-02 LAB — CBC
HCT: 39.3 % (ref 39.0–52.0)
Hemoglobin: 13.2 g/dL (ref 13.0–17.0)
MCH: 37.2 pg — ABNORMAL HIGH (ref 26.0–34.0)
MCHC: 33.6 g/dL (ref 30.0–36.0)
MCV: 110.7 fL — ABNORMAL HIGH (ref 80.0–100.0)
Platelets: 87 10*3/uL — ABNORMAL LOW (ref 150–400)
RBC: 3.55 MIL/uL — ABNORMAL LOW (ref 4.22–5.81)
RDW: 20.9 % — ABNORMAL HIGH (ref 11.5–15.5)
WBC: 12.9 10*3/uL — ABNORMAL HIGH (ref 4.0–10.5)
nRBC: 1.2 % — ABNORMAL HIGH (ref 0.0–0.2)

## 2021-07-02 LAB — CULTURE, RESPIRATORY W GRAM STAIN

## 2021-07-02 LAB — PHOSPHORUS: Phosphorus: 5.7 mg/dL — ABNORMAL HIGH (ref 2.5–4.6)

## 2021-07-02 LAB — MAGNESIUM: Magnesium: 2.9 mg/dL — ABNORMAL HIGH (ref 1.7–2.4)

## 2021-07-02 MED ORDER — ALBUMIN HUMAN 5 % IV SOLN
25.0000 g | Freq: Once | INTRAVENOUS | Status: AC
  Administered 2021-07-02: 25 g via INTRAVENOUS
  Filled 2021-07-02: qty 500

## 2021-07-02 MED ORDER — ALBUMIN HUMAN 25 % IV SOLN
25.0000 g | Freq: Once | INTRAVENOUS | Status: AC
  Administered 2021-07-03: 25 g via INTRAVENOUS
  Filled 2021-07-02: qty 100

## 2021-07-02 NOTE — Progress Notes (Signed)
[facility]  PROGRESS NOTE   Subjective:   Remains critically ill, febrile overnight. T Max 101.5 Remains intubated with 30% FiO2 with 5 PEEP No pressors Tube feeds at 60 mL/h Flexi-Seal with liquid stool   Objective:  Vital signs: Blood pressure 115/71, pulse (!) 103, temperature 100 F (37.8 C), resp. rate (!) 25, height $RemoveBe'6\' 3"'BFQCfwxXD$  (1.905 m), weight (!) 147.4 kg, SpO2 93 %.  Intake/Output Summary (Last 24 hours) at 07/02/2021 0928 Last data filed at 07/02/2021 0600 Gross per 24 hour  Intake 194 ml  Output 1700 ml  Net -1506 ml    Filed Weights   06/30/21 0320 07/01/21 1450 07/01/21 1800  Weight: (!) 148.4 kg (!) 148.8 kg (!) 147.4 kg     Physical Exam: General: Critically ill  Head:  Normocephalic, atraumatic.  Eyes: Jaundiced conjunctival edema  Lungs:  Rhonchi/wheeze throughout, normal effort  Heart:  S1S2 no rubs, tachy  Abdomen:  Firm, nontender, bowel sounds present  Extremities:  3+ peripheral edema.  Neurologic: Not following commands, responds to voice  Skin:  No lesions  Access: Left femoral trialysis catheter placed on 0/78/6754    Basic Metabolic Panel: Recent Labs  Lab 06/27/21 1631 06/28/21 0335 06/28/21 1524 06/29/21 0330 06/30/21 0420 07/01/21 0524 07/02/21 0417  NA 139   < > 139 140 142 142 139  K 3.3*   < > 4.2 4.5 4.3 4.8 4.4  CL 108   < > 107 109 107 106 103  CO2 20*   < > 23 22 20* 20* 20*  GLUCOSE 143*   < > 141* 131* 151* 139* 121*  BUN 38*   < > 38* 52* 90* 117* 121*  CREATININE 2.15*   < > 2.19* 2.77* 6.04* 8.59* 8.45*  CALCIUM 7.5*   < > 7.7* 7.3* 7.9* 7.8* 7.6*  MG 2.4  --  2.6*  --  3.2* 3.3* 2.9*  PHOS 1.5*   < > 2.9 3.2 5.3* 5.9* 5.7*   < > = values in this interval not displayed.     CBC: Recent Labs  Lab 06/28/21 0335 06/29/21 0330 06/30/21 0420 07/01/21 0524 07/02/21 0417  WBC 12.6* 13.9* 17.2* 14.3* 12.9*  HGB 13.8 13.9 15.2 13.3 13.2  HCT 40.8 41.7 44.6 39.9 39.3  MCV 111.5* 112.1* 111.8* 111.1*  110.7*  PLT 62* 62* 85* 89* 87*      Urinalysis: Recent Labs    06/29/21 0947  COLORURINE AMBER*  LABSPEC 1.032*  PHURINE 5.0  GLUCOSEU 50*  HGBUR LARGE*  BILIRUBINUR NEGATIVE  KETONESUR NEGATIVE  PROTEINUR 100*  NITRITE NEGATIVE  LEUKOCYTESUR NEGATIVE       Imaging: EEG adult  Result Date: 06/30/2021 Derek Jack, MD     06/30/2021 10:03 AM Routine EEG Report Socrates Cahoon is a 51 y.o. male with a history of delirium tremens who is undergoing an EEG to evaluate for seizures. Report: This EEG was acquired with electrodes placed according to the International 10-20 electrode system (including Fp1, Fp2, F3, F4, C3, C4, P3, P4, O1, O2, T3, T4, T5, T6, A1, A2, Fz, Cz, Pz). The following electrodes were missing or displaced: none. The background was composed of low amplitude continuous slowing at 3-5 Hz. This activity is reactive to stimulation. There was no sleep architecture identified. There was no focal slowing. There were no interictal epileptiform discharges. There were no electrographic seizures identified. Photic stimulation and hyperventilation were not performed. Impression and clinical correlation: This EEG was obtained while comatose and  is abnormal due to severe diffuse slowing consistent with global cerebral dysfunction. Epileptiform abnormalities were not seen during this recording. Su Monks, MD [facility] [phone] If 7pm- 7am, please page neurology on call as listed in [place].     Medications:     prismasol BGK 4/2.5 500 mL/hr at 06/27/21 2153    prismasol BGK 4/2.5 500 mL/hr at 06/27/21 2152   sodium chloride Stopped (06/29/21 2223)   sodium chloride 10 mL/hr at 07/02/21 0600   sodium chloride     sodium chloride     feeding supplement (VITAL AF 1.2 CAL) 1,000 mL (07/01/21 1750)   piperacillin-tazobactam (ZOSYN)  IV 2.25 g (07/02/21 0607)   prismasol BGK 4/2.5 2,500 mL/hr at 06/28/21 2030    artificial tears   Both Eyes Q8H    chlorhexidine gluconate (MEDLINE KIT)  15 mL Mouth Rinse BID   Chlorhexidine Gluconate Cloth  6 each Topical Q0600   feeding supplement (PROSource TF)  90 mL Per Tube QID   folic acid  1 mg Intravenous Daily   free water  30 mL Per Tube Q4H   heparin injection (subcutaneous)  5,000 Units Subcutaneous Q12H   lactulose  20 g Per Tube TID   mouth rinse  15 mL Mouth Rinse 10 times per day   multivitamin with minerals  1 tablet Per Tube Daily   pantoprazole sodium  40 mg Per Tube Daily   rifaximin  550 mg Per Tube BID   thiamine injection  100 mg Intravenous Q24H    Assessment/ Plan:     Principal Problem:   Acute metabolic encephalopathy Active Problems:   Acute kidney injury ([place])   Hyponatremia   Alcohol withdrawal ([place])   Transaminitis   51 y.o.  male with past medical history including alcohol abuse, chronic back pain, and peripheral neuropathy, who was admitted to [facility] on 07/01/2021 for Hyponatremia, Elevated liver function tests. Generalized weakness, AKI (acute kidney injury) and Acute metabolic encephalopathy.    #1: Acute kidney injury due to ETOH abuse, poor nutrition and severe illness.  Placed on CRRT during this admission.   Creatinine continues to worsen as no urine output recorded.  Appreciate ICU team placing trialysis catheter.  Underwent intermittent hemodialysis on Saturday 5/27.  Next treatment scheduled for Monday.   #2: Hypotension/shock: No pressors.     #3: Generalized edema-volume removal with hemodialysis as tolerated.  Patient is currently anuric. Iv albumin during dialysis for oncotic support.   #4: Acute respiratory failure with possible pneumonia: Intubated with antibiotic therapy.  ID following  #5: Transaminitis secondary to liver failure/shock liver: Most likely secondary to EtOH abuse.  Supportive care   LOS: [facility] 5/28/20239:28 AM

## 2021-07-02 NOTE — Progress Notes (Signed)
NAME:  Robert Coffey, MRN:  [id number], DOB:  1971/01/27, LOS: 73 ADMISSION DATE:  06/27/2021, CONSULTATION DATE:  06/22/2021 REFERRING MD:  Dr. Jimmye Norman, CHIEF COMPLAINT:  Acute Respiratory Distress, AMS   Brief Pt Description / Synopsis:  51 year old male admitted with acute metabolic encephalopathy secondary to alcohol withdrawal and severe hyponatremia requiring hypertonic saline.  On 5/18 developed acute hypoxic & hypercapnic respiratory failure requiring intubation and mechanical ventilation.  History of Present Illness:  Robert Coffey is a 51 year old male with a past medical history significant for alcohol abuse, obesity, chronic back pain, peripheral neuropathy who presented to [facility] ED on 06/13/2021 due to altered mental status, poor p.o. intake, progressive weakness with difficulty getting around at home.  Patient is now intubated and unable to contribute to history, therefore history is obtained from patient's wife at bedside and chart review.  Per the patient's wife, the patient has not been feeling well with poor p.o. intake for approximately a month, however has continued to drink alcohol daily (usually 3-4 drinks of liquor) during that time.  Over the past several days he has become progressively weak with difficulty getting around at home (not even in bed able to get out of bed), intermittent confusion, and diffuse abdominal cramping/nausea/vomiting/diarrhea.  Denies any history of falls, chest pain, shortness of breath, fever, cough.  Last known alcoholic drink was the day prior to admission on Sunday.  ED Course: Initial Vital Signs: Temperature 98.1 F orally, blood pressure 136/119, respiratory rate 18, pulse 106, SPO2 95% Significant Labs: Sodium 109, chloride 69, glucose 108, BUN 8, creatinine 1.62, anion gap 17, albumin 3.1, lipase 86, AST 256, ALT 119, total bilirubin 5.0, high-sensitivity troponin 67, serum osmolality 236, urine osmolality 437, urine sodium less than 10 WBC  11.0, platelets 129, TSH 2.1 COVID-19 and influenza PCR negative Urinalysis negative for UTI Urine drug screen positive for tricyclics Ethyl alcohol less than 10 Imaging CT head without contrast>>IMPRESSION: 1. Fluid or debris in the left sinus tympani/middle ear, cannot exclude otitis media. There small bilateral mastoid effusions and chronic bilateral maxillary sinusitis. 2. Advanced for age cerebral atrophy. 3. Atherosclerosis. CT abdomen and pelvis>>IMPRESSION: Hepatic steatosis. 2.6 cm enhancing left adrenal nodule is noted. When the patient is clinically stable and able to follow directions and hold their breath (preferably as an outpatient) further evaluation with dedicated abdominal MRI should be considered. Medications Administered: 1 L normal saline bolus, hypertonic saline infusion  He was admitted by the hospitalist for further work-up and treatment of acute metabolic encephalopathy in the setting of DTs and severe hyponatremia.  Please see "significant hospital events" section below for full detailed hospital course.  Pertinent  Medical History  Alcohol abuse Obesity Chronic back  Micro Data:  5/16: SARS-CoV-2 and influenza PCR>> negative 5/16: HIV screen>> nonreactive 5/16: MRSA PCR>> negative 5/18: Tracheal aspirate>>normal respiratory flora 5/19: Blood x2>>negative 5/25: Tracheal aspirate>>rare gram - rods, rare yeast with pseudohyphae 5/25: MRSA PCR>> negative  Antimicrobials:  5/18: Unasyn>>x2 doses, restarted 5/22>>5/24 5/18: Cefepime>>5/19 5/18: Vancomycin>>5/19 5/18: Flagyl>>5/21 5/25: Zosyn>>  Significant Hospital Events: Including procedures, antibiotic start and stop dates in addition to other pertinent events   5/16: Admitted by hospitalist for acute metabolic encephalopathy in setting of severe hyponatremia and DTs.  Requiring hypertonic saline 5/18: Developed acute hypoxic respiratory failure, possible concern for aspiration.  Required  intubation and mechanical ventilation. 5/19: Pt with worsening acute renal failure with severe metabolic and lactic acidosis requiring CRRT.  Severely hypotensive requiring epinephrine, levophed, and vasopressin gtts to  maintain map >65  5/20: Remains critically ill, on CRRT. Vent support slightly weaned to 60% FiO2 & 10 PEEP. Some improvement in vasopressor requirements (off of epinephrine and phenylephrine) 5/21: Overnight weaned off of Giapreza, currently only on norepinephrine and vasopressin 5/22: No acute events overnight on minimal ventilator settings FiO2 30% PEEP 8.  Remains on levophed gtt to maintain map >65.  Will perform WUA  5/22: CT Head revealed No acute intracranial findings. No        change from CT 07/04/2021. 5/24: Transitioned off CRRT 5/25: Neuro exam remains poor off sedation, plan for MRI Brain.  New fever, obtain UA/TA/RUQ [place] & Doppler LE, line holiday (central line, trialysis, and A-line all removed). Empiric Zosyn started.  Neurology and ID consulted 5/26: Neuro exam slightly improving.  Fever resolved.  Plan to place new temporary HD catheter per Nephrology request.  Interim History / Subjective:  -No significant events overnight -Fever resolved overnight without administration of tylenol  -Repeat tracheal aspirate preliminary results with rate gram - rods & rare yeast ~ ID following, continue empiric Zosyn for now -Leukocytosis slightly worsened to 17.2 K (13.9 K) -Hemodynamically stable, remains off vasopressors -Minimal ventilator settings FiO2 30% PEEP 5 ~ will exercise in PSV as tolerated -MRI Brain negative yesterday ~ pt opens eyes to voice and wiggles toes, tries to move UE but extremely weak   Objective   Blood pressure 106/67, pulse (!) 108, temperature (!) 100.4 F (38 C), resp. rate (!) 25, height $RemoveBe'6\' 3"'BbkKyGpNu$  (1.905 m), weight (!) 147.4 kg, SpO2 93 %.    Vent Mode: PRVC FiO2 (%):  [28 %] 28 % Set Rate:  [14 bmp-22 bmp] 14 bmp Vt Set:  [650 mL] 650  mL PEEP:  [5 cmH20] 5 cmH20 Pressure Support:  [10 cmH20] 10 cmH20 Plateau Pressure:  [16 cmH20-22 cmH20] 16 cmH20   Intake/Output Summary (Last 24 hours) at 07/02/2021 1702 Last data filed at 07/02/2021 0900 Gross per 24 hour  Intake 194 ml  Output 1700 ml  Net -1506 ml    Filed Weights   06/30/21 0320 07/01/21 1450 07/01/21 1800  Weight: (!) 148.4 kg (!) 148.8 kg (!) 147.4 kg   Examination: General: Acutely ill-appearing obese male, laying in bed, NAD, on NO sedation, mechanically intubated  HENT: Atraumatic, normocephalic, neck supple, difficult to assess JVD due to body habitus Lungs: Coarse breath sounds throughout, even, non labored, synchronous with vent  Cardiovascular: Tachycardia, Regular rhythm,  no murmurs, rubs, gallops, 1+ generalized edema  Abdomen: +BS x4, obese, soft, non distended  Extremities: Normal bulk and tone Neuro: Off sedation, opens eyes to verbal stimulation, wiggles toes bilaterally (attempting to move UE but extremely weak), PERRL, scleredema  GU: Indwelling foley catheter draining yellow urine   Imaging     Assessment & Plan:   Acute hypoxic & hypercapnic respiratory failure in the setting of suspected aspiration in the setting of severe DTs Pulmonary Embolism - cannot r/o  -Full vent support, on lung protective strategies -Plateau pressures less than 30 cm H20 -Wean FiO2 as tolerated to maintain O2 sats >92% -Maintain PEEP  for left base atelectasis -Follow intermittent Chest X-ray & ABG as needed -Spontaneous Breathing Trials when respiratory parameters met and mental status permits -Continue VAP Bundle -PRN Bronchodilators -Venous US Bilateral LE negative for DVT x2  Septic shock~RESOLVED Elevated troponin suspect secondary to demand ischemia -Continuous cardiac monitoring -Maintain MAP 60-65 -Vasopressors as needed to maintain MAP goal -D/c Midodrine 5/25 -HS Troponin peaked at 67 -  Echocardiogram 06/23/21: LVEF 60-65%, Grade I DD,  normal RV systolic function  Severe Sepsis due to Suspected aspiration~TREATED CT head 5/16 unable to exclude left otitis media and chronic bilateral maxillary sinusitis New Fever 5/25 ~ RESOLVED Temp (24hrs), Avg:100.9 F (38.3 C), Min:100 F (37.8 C), Max:101.5 F (38.6 C)  Lab Results  Component Value Date   WBC 12.9 (H) 07/02/2021   -Monitor fever curve -Trend WBC's & Procalcitonin -Follow cultures as above -Completed course of Unasyn  - ID following, continue empiric Zosyn for now as per ID -Line holiday given on 5/25  Hypotonic hyponatremia, suspect secondary to dehydration & poor p.o. intake~RESOLVED Acute kidney injury secondary to ATN with severe metabolic acidosis  Lactic acidosis persistent likely due to hepatic impairment Lab Results  Component Value Date   CREATININE 8.45 (H) 07/02/2021   BUN 121 (H) 07/02/2021   NA 139 07/02/2021   K 4.4 07/02/2021   CL 103 07/02/2021   CO2 20 (L) 07/02/2021   -Trend BMP, lactic acid, and ABG -Creatinine remains elevated but K and bicarb are normalized on HD -Persistent lactic acid elevation likely due to hepatic dysfunction, will not follow daily -Replace electrolytes as indicated  -Monitor UOP -Nephrology following, appreciate input: Transitioned off CRRT to intermittent HD 5/24  Transaminitis secondary to hepatic steatosis & ETOH abuse & Shock liver Lab Results  Component Value Date   AST 427 (H) 07/02/2021   AST 486 (H) 06/29/2021   AST 665 (H) 06/26/2021   -Trend LFTs, falling  Thrombocytopenia, suspect secondary to alcohol abuse Lab Results  Component Value Date   WBC 12.9 (H) 07/02/2021   HGB 13.2 07/02/2021   HCT 39.3 07/02/2021   MCV 110.7 (H) 07/02/2021   PLT 87 (L) 07/02/2021   -Trend CBC -Monitor for s/sx of bleeding and transfuse for Hgb <7 -Transfuse platelets for platelet count less than 50 with active bleeding  Incidental finding of left adrenal nodule via CT Abd/Pelvis  -Will need abdominal  MRI for further evaluation in the outpatient setting   Acute metabolic encephalopathy in the setting of severe DTs and severe hyponatremia Sedation needs in the setting of mechanical ventilation -Maintain a RASS goal of 0 to -1 but attempt to ration sedating meds  -Avoid sedating medications as able -Continue thiamine  -Continue folic acid and multivitamin -Continue lactulose and rifaximin trend ammonia level  -MRI Brain 5/25 negative -Neurology consulted, appreciate input -EEG negative for seizures -Perhaps some following of commands and purposeful movement, which is improvement   Morbid obesity with sarcopenia Protein malnutrition -Calorie excess, protein deficiency in the setting of alcoholism -Dietitian consulted for initiation of TF's  Best Practice (right click and "Reselect all SmartList Selections" daily)  Diet/type: TF's DVT prophylaxis: Subq heparin  GI prophylaxis: PPI Lines: N/A Foley:  N/A Code Status: DNR  Last date of multidisciplinary goals of care discussion [06/30/21]  Update pts wife 5/26 at bedside.  All questions answered to her satisfaction.  Labs   CBC: Recent Labs  Lab 06/28/21 0335 06/29/21 0330 06/30/21 0420 07/01/21 0524 07/02/21 0417  WBC 12.6* 13.9* 17.2* 14.3* 12.9*  HGB 13.8 13.9 15.2 13.3 13.2  HCT 40.8 41.7 44.6 39.9 39.3  MCV 111.5* 112.1* 111.8* 111.1* 110.7*  PLT 62* 62* 85* 89* 87*     Basic Metabolic Panel: Recent Labs  Lab 06/27/21 1631 06/28/21 0335 06/28/21 1524 06/29/21 0330 06/30/21 0420 07/01/21 0524 07/02/21 0417  NA 139   < > 139 140 142 142 139  K 3.3*   < >  4.2 4.5 4.3 4.8 4.4  CL 108   < > 107 109 107 106 103  CO2 20*   < > 23 22 20* 20* 20*  GLUCOSE 143*   < > 141* 131* 151* 139* 121*  BUN 38*   < > 38* 52* 90* 117* 121*  CREATININE 2.15*   < > 2.19* 2.77* 6.04* 8.59* 8.45*  CALCIUM 7.5*   < > 7.7* 7.3* 7.9* 7.8* 7.6*  MG 2.4  --  2.6*  --  3.2* 3.3* 2.9*  PHOS 1.5*   < > 2.9 3.2 5.3* 5.9* 5.7*   < > =  values in this interval not displayed.    GFR: Estimated Creatinine Clearance: 16.6 mL/min (A) (by C-G formula based on SCr of 8.45 mg/dL (H)). Recent Labs  Lab 06/26/21 0316 06/27/21 0357 06/27/21 1001 06/27/21 2219 06/28/21 0335 06/29/21 0330 06/30/21 0420 07/01/21 0524 07/02/21 0417  WBC 15.1*   < >  --   --    < > 13.9* 17.2* 14.3* 12.9*  LATICACIDVEN 3.3*  --  3.3* 3.0*  --   --   --   --   --    < > = values in this interval not displayed.     Liver Function Tests: Recent Labs  Lab 06/26/21 0332 06/26/21 1425 06/27/21 1631 06/28/21 0335 06/28/21 1524 06/29/21 0330 07/02/21 1150  AST 665*  --   --   --   --  486* 427*  ALT 251*  --   --   --   --  229* 247*  ALKPHOS 110  --   --   --   --  110 125  BILITOT 7.2*  --   --   --   --  7.2* 4.6*  PROT 5.5*  --   --   --   --  5.2* 5.5*  ALBUMIN 2.1*   < > 1.8* 1.9* 2.1* 1.9*  1.9* 1.8*   < > = values in this interval not displayed.    No results for input(s): LIPASE, AMYLASE in the last 168 hours.  Recent Labs  Lab 06/27/21 0357  AMMONIA 59*     ABG    Component Value Date/Time   PHART 7.41 06/29/2021 1130   PCO2ART 32 06/29/2021 1130   PO2ART 94 06/29/2021 1130   HCO3 20.3 06/29/2021 1130   ACIDBASEDEF 3.3 (H) 06/29/2021 1130   O2SAT 98.3 06/29/2021 1130      Coagulation Profile: No results for input(s): INR, PROTIME in the last 168 hours.   Cardiac Enzymes: Recent Labs  Lab 06/30/21 1956  CKTOTAL 347     HbA1C: No results found for: HGBA1C  CBG: Recent Labs  Lab 07/01/21 2328 07/02/21 0458 07/02/21 0753 07/02/21 1109 07/02/21 1527  GLUCAP 124* 128* 132* 124* 146*     Review of Systems:   Unable to assess due to intubation/sedation/critical illness  Allergies Allergies  Allergen Reactions   Sulfa Antibiotics      Home Medications  Prior to Admission medications   Medication Sig Start Date End Date Taking? Authorizing Provider  atorvastatin (LIPITOR) 20 MG tablet  Take 20 mg by mouth daily. 02/03/21  Yes [provider]  cyanocobalamin 1000 MCG tablet Take by mouth.   Yes [provider]  hydrochlorothiazide (HYDRODIURIL) 25 MG tablet Take 25 mg by mouth daily. 05/30/21  Yes [provider]  lisinopril (ZESTRIL) 40 MG tablet Take 40 mg by mouth daily. 05/04/21  Yes [provider]  rOPINIRole (REQUIP) 0.25 MG tablet Take 0.25 mg by mouth 3 (three) times daily. 07/28/20  Yes [provider]  vitamin C (ASCORBIC ACID) 500 MG tablet Take 500 mg by mouth daily.   Yes [provider]  zolpidem (AMBIEN) 10 MG tablet Take 10 mg by mouth at bedtime as needed. 06/07/21  Yes [provider]     Scheduled Meds:  artificial tears   Both Eyes Q8H   chlorhexidine gluconate (MEDLINE KIT)  15 mL Mouth Rinse BID   Chlorhexidine Gluconate Cloth  6 each Topical Q0600   feeding supplement (PROSource TF)  90 mL Per Tube QID   folic acid  1 mg Intravenous Daily   free water  30 mL Per Tube Q4H   heparin injection (subcutaneous)  5,000 Units Subcutaneous Q12H   lactulose  20 g Per Tube TID   mouth rinse  15 mL Mouth Rinse 10 times per day   multivitamin with minerals  1 tablet Per Tube Daily   pantoprazole sodium  40 mg Per Tube Daily   rifaximin  550 mg Per Tube BID   thiamine injection  100 mg Intravenous Q24H   Continuous Infusions:  sodium chloride Stopped (06/29/21 2223)   [START ON 07/03/2021] albumin human     feeding supplement (VITAL AF 1.2 CAL) 1,000 mL (07/02/21 1321)   piperacillin-tazobactam (ZOSYN)  IV 2.25 g (07/02/21 0607)   PRN Meds:.acetaminophen **OR** acetaminophen, alteplase, heparin, heparin, labetalol, lidocaine (PF), lidocaine-prilocaine, ondansetron **OR** ondansetron (ZOFRAN) IV   Critical care time: 40 minutes

## 2021-07-02 NOTE — Progress Notes (Addendum)
S: Patient fevered overnight, currently on zosyn. He is arousable to sternal rub today but exam slightly worse than Friday, will not follow any commands for me.   MRI brain wo showed no e/o acute infarct or anoxic injury (personal review) rEEG showed diffuse slowing but no epileptiform discharges  O:  Vitals:   07/02/21 1953 07/02/21 2000  BP:  130/80  Pulse:  (!) 101  Resp:  (!) 21  Temp:  99 F (37.2 C)  SpO2: 93% 94%   Physical Exam Gen: intubated, not sedated, arousable to sternal rub but not following any commands Resp: intubated CV: RRR   Neuro: *MS: intubated, not sedated, arousable to sternal rub but not following any commands *Speech: no attempts to speak *CN: PERRL, (+) corneals, oculocephalics, cough, gag, face symmetric at rest, hearing intact to voice *Motor & sensory: minimally withdraws to noxious stimuli BLE>BUE *Coordination, gait:  UTA *Reflexes:  1+ and symmetric throughout without clonus; toes mute bilat  A/P: 51 yo gentleman admitted with acute metabolic encephalopathy in the setting of EtOH withdrawal, hyponatremia. Hospital course complicated by aspiration PNA on unasyn, AKI with anuria requiring CRRT now transitioning to HD, acute liver failure likely multifactorial 2/2 EtOH, tylenol, and shock liver, as well as hypotension requiring pressors. He was hyponatremic at 109 on admission which was corrected at appropriate rate; now normonatremic. Neurology was initially consulted for failure to wake up after being off sedation for 3 days. At time of our initial consultation on 3/26 he was able to follow some simple commands including wiggling toes on both feet. He is not currently following commands anymore in the setting of active infection and ongoing acute renal failure and liver failure. His CK was WNL but he also likely has a significant component of critical illness polyneuropathy contributing to weakness.   - No further specific neurologic workup recommended  today. If patient has not improved by Tues, would consider repeat EEG - Will continue to follow  Su Monks, MD [facility] [phone]  If 7pm- 7am, please page neurology on call as listed in [place].

## 2021-07-02 NOTE — Plan of Care (Signed)
Patient mental status improving, following commands, but extremely weak. Able to wiggle toes, and stick out tongue and attempts to grip. Opens eyes spontaneously. Generalized edema throughout, and sclera edematous, but able to track movement with eyes. Receiving tube feeds at goal via OG. Tolerating vent, moderate oral and tracheal secretions noted, requires frequent oral suction. Continues to have large amount loose BM, flexiseal in place. Sinus tach on monitor. Received hemodialysis yesterday via left femoral trialysis catheter. ST on monitor low 100's.  Problem: Education: Goal: Knowledge of General Education information will improve Description: Including pain rating scale, medication(s)/side effects and non-pharmacologic comfort measures Outcome: Progressing   Problem: Health Behavior/Discharge Planning: Goal: Ability to manage health-related needs will improve Outcome: Progressing   Problem: Clinical Measurements: Goal: Ability to maintain clinical measurements within normal limits will improve Outcome: Progressing Goal: Will remain free from infection Outcome: Progressing Goal: Diagnostic test results will improve Outcome: Progressing Goal: Respiratory complications will improve Outcome: Progressing Goal: Cardiovascular complication will be avoided Outcome: Progressing   Problem: Activity: Goal: Risk for activity intolerance will decrease Outcome: Progressing   Problem: Nutrition: Goal: Adequate nutrition will be maintained Outcome: Progressing   Problem: Coping: Goal: Level of anxiety will decrease Outcome: Progressing   Problem: Elimination: Goal: Will not experience complications related to bowel motility Outcome: Progressing Goal: Will not experience complications related to urinary retention Outcome: Progressing   Problem: Pain Managment: Goal: General experience of comfort will improve Outcome: Progressing   Problem: Safety: Goal: Ability to remain free from  injury will improve Outcome: Progressing   Problem: Skin Integrity: Goal: Risk for impaired skin integrity will decrease Outcome: Progressing

## 2021-07-02 NOTE — Progress Notes (Signed)
Patients spouse, [name], called for patient update. Update given, and [name] seems hopeful about patients slow progress. Informed [name] that Ronalee Belts is able to follow simple commands and that our plan is to continue to promote mobility as he tolerates the activity. [name] verbalized gratitude for care being given to her husband

## 2021-07-02 NOTE — Consult Note (Signed)
[facility] for Electrolyte Monitoring and Replacement   Recent Labs: Potassium (mmol/L)  Date Value  07/02/2021 4.4   Magnesium (mg/dL)  Date Value  07/02/2021 2.9 (H)   Calcium (mg/dL)  Date Value  07/02/2021 7.6 (L)   Albumin (g/dL)  Date Value  06/29/2021 1.9 (L)  06/29/2021 1.9 (L)   Phosphorus (mg/dL)  Date Value  07/02/2021 5.7 (H)   Sodium (mmol/L)  Date Value  07/02/2021 139   Assessment: Patient is a 51 y/o M with medical history including obesity, chronic back pain, EtOH use disorder who presented to the ED 5/16 with weakness in setting of abdominal cramps, nausea, vomiting, and diarrhea. Patient subsequently admitted for severe symptomatic hyponatremia. Patient decompensated on 5/18 with acute onset respiratory failure requiring intubation. Patient remains admitted to the ICU where he is intubated, sedated, and on mechanical ventilation. There is further concern for septic shock and patient is requiring multiple vasopressor infusions. Admission further complicated by acute renal failure necessitating initiation of CRRT on 5/19. Pharmacy consulted to assist with electrolyte monitoring and replacement as indicated.  Nutrition: Tube feeds initiated 5/22 Cathartics: Lactulose 20 g TID for hepatic encephalopathy  CRRT discontinued 5/24 evening per nephrology. Anticipate transition to iHD - first session tentatively 5/26  Goal of Therapy:  Electrolytes within normal limits  Plan:  --Patient received dialysis yesterday(5/27), with next session scheduled for Monday, 5/29 --No electrolyte replacement indicated at this time --Follow-up electrolytes with AM labs tomorrow  Robert Coffey 07/02/2021 9:44 AM

## 2021-07-03 DIAGNOSIS — G9341 Metabolic encephalopathy: Secondary | ICD-10-CM | POA: Diagnosis not present

## 2021-07-03 DIAGNOSIS — R7401 Elevation of levels of liver transaminase levels: Secondary | ICD-10-CM | POA: Diagnosis not present

## 2021-07-03 DIAGNOSIS — N179 Acute kidney failure, unspecified: Secondary | ICD-10-CM | POA: Diagnosis not present

## 2021-07-03 DIAGNOSIS — F10931 Alcohol use, unspecified with withdrawal delirium: Secondary | ICD-10-CM | POA: Diagnosis not present

## 2021-07-03 LAB — CBC
HCT: 35.5 % — ABNORMAL LOW (ref 39.0–52.0)
Hemoglobin: 12.1 g/dL — ABNORMAL LOW (ref 13.0–17.0)
MCH: 38.1 pg — ABNORMAL HIGH (ref 26.0–34.0)
MCHC: 34.1 g/dL (ref 30.0–36.0)
MCV: 111.6 fL — ABNORMAL HIGH (ref 80.0–100.0)
Platelets: 89 10*3/uL — ABNORMAL LOW (ref 150–400)
RBC: 3.18 MIL/uL — ABNORMAL LOW (ref 4.22–5.81)
RDW: 20.3 % — ABNORMAL HIGH (ref 11.5–15.5)
WBC: 11 10*3/uL — ABNORMAL HIGH (ref 4.0–10.5)
nRBC: 0.4 % — ABNORMAL HIGH (ref 0.0–0.2)

## 2021-07-03 LAB — PHOSPHORUS: Phosphorus: 7.1 mg/dL — ABNORMAL HIGH (ref 2.5–4.6)

## 2021-07-03 LAB — GLUCOSE, CAPILLARY
Glucose-Capillary: 102 mg/dL — ABNORMAL HIGH (ref 70–99)
Glucose-Capillary: 107 mg/dL — ABNORMAL HIGH (ref 70–99)
Glucose-Capillary: 122 mg/dL — ABNORMAL HIGH (ref 70–99)
Glucose-Capillary: 123 mg/dL — ABNORMAL HIGH (ref 70–99)
Glucose-Capillary: 134 mg/dL — ABNORMAL HIGH (ref 70–99)
Glucose-Capillary: 138 mg/dL — ABNORMAL HIGH (ref 70–99)

## 2021-07-03 LAB — BASIC METABOLIC PANEL
Anion gap: 18 — ABNORMAL HIGH (ref 5–15)
BUN: 132 mg/dL — ABNORMAL HIGH (ref 6–20)
CO2: 18 mmol/L — ABNORMAL LOW (ref 22–32)
Calcium: 7.9 mg/dL — ABNORMAL LOW (ref 8.9–10.3)
Chloride: 105 mmol/L (ref 98–111)
Creatinine, Ser: 11.12 mg/dL — ABNORMAL HIGH (ref 0.61–1.24)
GFR, Estimated: 5 mL/min — ABNORMAL LOW (ref >60)
Glucose, Bld: 125 mg/dL — ABNORMAL HIGH (ref 70–99)
Potassium: 4.3 mmol/L (ref 3.5–5.1)
Sodium: 141 mmol/L (ref 135–145)

## 2021-07-03 LAB — MAGNESIUM: Magnesium: 3.4 mg/dL — ABNORMAL HIGH (ref 1.7–2.4)

## 2021-07-03 LAB — LIPASE, BLOOD: Lipase: 227 U/L — ABNORMAL HIGH (ref 11–51)

## 2021-07-03 MED ORDER — HEPARIN SODIUM (PORCINE) 1000 UNIT/ML IJ SOLN
INTRAMUSCULAR | Status: AC
  Administered 2021-07-03: 2800 [IU] via INTRAVENOUS_CENTRAL
  Filled 2021-07-03: qty 10

## 2021-07-03 NOTE — Progress Notes (Signed)
[facility]  PROGRESS NOTE   Subjective:   Patient seen and evaluated during dialysis   HEMODIALYSIS FLOWSHEET:  Blood Flow Rate (mL/min): 250 mL/min Arterial Pressure (mmHg): -150 mmHg Venous Pressure (mmHg): 100 mmHg Transmembrane Pressure (mmHg): 60 mmHg Ultrafiltration Rate (mL/min): 590 mL/min Dialysate Flow Rate (mL/min): 500 ml/min Conductivity: Machine : 13.7 Conductivity: Machine : 13.7 Dialysis Fluid Bolus: Normal Saline Bolus Amount (mL): 250 mL  Patient remains intubated on vent Opens eyes with name Vent settings 28 FiO2 with 5 PEEP Remains off pressors Hemodialysis initiated, tolerating well.  Anuric Foley catheter and Flexi-Seal remain in place. Tube feeds 60 mL/h Generalized edema Afebrile  Objective:  Vital signs: Blood pressure 118/77, pulse 98, temperature 98.8 F (37.1 C), resp. rate (!) 21, height _0  (1.905 m), weight (!) 149.7 kg, SpO2 93 %.  Intake/Output Summary (Last 24 hours) at 07/03/2021 1137 Last data filed at 07/03/2021 0520 Gross per 24 hour  Intake 1330.93 ml  Output 1200 ml  Net 130.93 ml    Filed Weights   07/01/21 1800 07/03/21 0500 07/03/21 0923  Weight: (!) 147.4 kg (!) 145.4 kg (!) 149.7 kg     Physical Exam: General: Critically ill  Head:  Normocephalic, atraumatic.  Eyes: Jaundiced conjunctival edema  Lungs:  Rhonchi/wheeze throughout, normal effort  Heart:  S1S2 no rubs, tachy  Abdomen:  Firm, nontender, bowel sounds present  Extremities:  3+ peripheral edema.  Neurologic: Not following commands, responds to voice  Skin:  No lesions  Access: Left femoral trialysis catheter placed on 04/18/9700    Basic Metabolic Panel: Recent Labs  Lab 06/28/21 1524 06/29/21 0330 06/30/21 0420 07/01/21 0524 07/02/21 0417 07/03/21 0445  NA 139 140 142 142 139 141  K 4.2 4.5 4.3 4.8 4.4 4.3  CL 107 109 107 106 103 105  CO2 23 22 20* 20* 20* 18*  GLUCOSE 141* 131* 151* 139* 121* 125*  BUN 38* 52* 90* 117*  121* 132*  CREATININE 2.19* 2.77* 6.04* 8.59* 8.45* 11.12*  CALCIUM 7.7* 7.3* 7.9* 7.8* 7.6* 7.9*  MG 2.6*  --  3.2* 3.3* 2.9* 3.4*  PHOS 2.9 3.2 5.3* 5.9* 5.7* 7.1*     CBC: Recent Labs  Lab 06/29/21 0330 06/30/21 0420 07/01/21 0524 07/02/21 0417 07/03/21 0445  WBC 13.9* 17.2* 14.3* 12.9* 11.0*  HGB 13.9 15.2 13.3 13.2 12.1*  HCT 41.7 44.6 39.9 39.3 35.5*  MCV 112.1* 111.8* 111.1* 110.7* 111.6*  PLT 62* 85* 89* 87* 89*      Urinalysis: No results for input(s): COLORURINE, LABSPEC, PHURINE, GLUCOSEU, HGBUR, BILIRUBINUR, KETONESUR, PROTEINUR, UROBILINOGEN, NITRITE, LEUKOCYTESUR in the last 72 hours.  Invalid input(s): APPERANCEUR     Imaging: No results found.   Medications:    sodium chloride Stopped (06/29/21 2223)   feeding supplement (VITAL AF 1.2 CAL) 60 mL/hr at 07/02/21 1700   piperacillin-tazobactam (ZOSYN)  IV 2.25 g (07/03/21 0520)    artificial tears   Both Eyes Q8H   chlorhexidine gluconate (MEDLINE KIT)  15 mL Mouth Rinse BID   Chlorhexidine Gluconate Cloth  6 each Topical Q0600   feeding supplement (PROSource TF)  90 mL Per Tube QID   folic acid  1 mg Intravenous Daily   free water  30 mL Per Tube Q4H   heparin injection (subcutaneous)  5,000 Units Subcutaneous Q12H   heparin sodium (porcine)       lactulose  20 g Per Tube TID   mouth rinse  15 mL Mouth Rinse 10 times  per day   multivitamin with minerals  1 tablet Per Tube Daily   pantoprazole sodium  40 mg Per Tube Daily   rifaximin  550 mg Per Tube BID   thiamine injection  100 mg Intravenous Q24H    Assessment/ Plan:     Principal Problem:   Acute metabolic encephalopathy Active Problems:   Acute kidney injury ([place])   Hyponatremia   Alcohol withdrawal ([place])   Transaminitis   51 y.o.  male with past medical history including alcohol abuse, chronic back pain, and peripheral neuropathy, who was admitted to [facility] on 06/16/2021 for Hyponatremia, Elevated liver function tests. Generalized  weakness, AKI (acute kidney injury) and Acute metabolic encephalopathy.    #1: Acute kidney injury due to ETOH abuse, poor nutrition and severe illness.  Placed on CRRT during this admission.   Currently receiving hemodialysis, UF goal 0.5 to 1 L as tolerated.  Next dialysis treatment tomorrow.   #2: Hypotension/shock: Remains off pressors at this time.   #3: Generalized edema-volume removal with hemodialysis as tolerated.  Patient is currently anuric. Iv albumin during dialysis for oncotic support.  Will attempt to manage edema with dialysis, as tolerated.   #4: Acute respiratory failure with possible pneumonia: Intubated with antibiotic therapy.  ID following  #5: Transaminitis secondary to liver failure/shock liver: Most likely secondary to EtOH abuse.  Supportive care   LOS: [facility] 5/29/202311:37 AM

## 2021-07-03 NOTE — Progress Notes (Signed)
PT Cancellation Note  Patient Details Name: Robert Coffey MRN: [id number] DOB: [birth year]   Cancelled Treatment:    Reason Eval/Treat Not Completed: Other (comment): Pt with L temporary femoral catheter and per protocol bed mobility is contraindicated at this time.  Will attempt to see pt at a future date/time as medically appropriate.    Linus Salmons PT, DPT 07/03/21, 8:52 AM

## 2021-07-03 NOTE — TOC Progression Note (Signed)
Transition of Care [facility]) - Progression Note    Patient Details  Name: Robert Coffey MRN: [id number] Date of Birth: 1970-11-19  Transition of Care [facility]) CM/SW [place], [place] Phone Number: [phone] 07/03/2021, 2:36 PM  Clinical Narrative:     Patient mentation improved, HD, renal function worsened overnight. PT unable to assess patient.  Expected Discharge Plan:  (TBD) Barriers to Discharge: Continued Medical Work up  Expected Discharge Plan and Services Expected Discharge Plan:  (TBD)       Living arrangements for the past 2 months: Single Family Home                 DME Arranged: N/A DME Agency: NA       HH Arranged: NA HH Agency: NA         Social Determinants of Health (SDOH) Interventions    Readmission Risk Interventions     View : No data to display.

## 2021-07-03 NOTE — Progress Notes (Signed)
Hemodialysis Post Treatment Note:  Tx date:07/03/2021 Tx time: 2 hours and 30 minutes Access: left  femoral catheter UF Removed: 1 L  Note: HD completed. Tolerated well. No complications. Patient asymptomatic.

## 2021-07-03 NOTE — Consult Note (Signed)
[facility] for Electrolyte Monitoring and Replacement   Recent Labs: Potassium (mmol/L)  Date Value  07/03/2021 4.3   Magnesium (mg/dL)  Date Value  07/03/2021 3.4 (H)   Calcium (mg/dL)  Date Value  07/03/2021 7.9 (L)   Albumin (g/dL)  Date Value  07/02/2021 1.8 (L)   Phosphorus (mg/dL)  Date Value  07/03/2021 7.1 (H)   Sodium (mmol/L)  Date Value  07/03/2021 141   Assessment: Patient is a 51 y/o M with medical history including obesity, chronic back pain, EtOH use disorder who presented to the ED 5/16 with weakness in setting of abdominal cramps, nausea, vomiting, and diarrhea. Patient subsequently admitted for severe symptomatic hyponatremia. Patient decompensated on 5/18 with acute onset respiratory failure requiring intubation. Patient remains admitted to the ICU where he is intubated and on mechanical ventilation.  Admission further complicated by acute renal failure necessitating initiation of CRRT on 5/19 and subsequently transitioned to HD. Pharmacy consulted to assist with electrolyte monitoring and replacement as indicated.  Nutrition: Tube feeds initiated 5/22 Cathartics: Lactulose 20 g TID for hepatic encephalopathy  Goal of Therapy:  Electrolytes within normal limits  Plan:  --No electrolyte replacement indicated at this time --Follow-up electrolytes with AM labs tomorrow  [name] 07/03/2021 8:55 AM

## 2021-07-03 NOTE — Progress Notes (Signed)
NAME:  Robert Coffey, MRN:  [id number], DOB:  11-30-1970, LOS: 42 ADMISSION DATE:  06/11/2021, CONSULTATION DATE:  06/22/2021 REFERRING MD:  Dr. Jimmye Norman, CHIEF COMPLAINT:  Acute Respiratory Distress, AMS   Brief Pt Description / Synopsis:  51 year old male admitted with acute metabolic encephalopathy secondary to alcohol withdrawal and severe hyponatremia requiring hypertonic saline.  On 5/18 developed acute hypoxic & hypercapnic respiratory failure requiring intubation and mechanical ventilation.  07/03/21- patient mentation improved, he is s/p HD today. Renal function worse overnight.    History of Present Illness:  Robert Coffey is a 51 year old male with a past medical history significant for alcohol abuse, obesity, chronic back pain, peripheral neuropathy who presented to [facility] ED on 06/09/2021 due to altered mental status, poor p.o. intake, progressive weakness with difficulty getting around at home.  Patient is now intubated and unable to contribute to history, therefore history is obtained from patient's wife at bedside and chart review.  Per the patient's wife, the patient has not been feeling well with poor p.o. intake for approximately a month, however has continued to drink alcohol daily (usually 3-4 drinks of liquor) during that time.  Over the past several days he has become progressively weak with difficulty getting around at home (not even in bed able to get out of bed), intermittent confusion, and diffuse abdominal cramping/nausea/vomiting/diarrhea.  Denies any history of falls, chest pain, shortness of breath, fever, cough.  Last known alcoholic drink was the day prior to admission on Sunday.  Pertinent  Medical History  Alcohol abuse Obesity Chronic back  Micro Data:  5/16: SARS-CoV-2 and influenza PCR>> negative 5/16: HIV screen>> nonreactive 5/16: MRSA PCR>> negative 5/18: Tracheal aspirate>>normal respiratory flora 5/19: Blood x2>>negative 5/25: Tracheal aspirate>>rare  gram - rods, rare yeast with pseudohyphae 5/25: MRSA PCR>> negative  Antimicrobials:  5/18: Unasyn>>x2 doses, restarted 5/22>>5/24 5/18: Cefepime>>5/19 5/18: Vancomycin>>5/19 5/18: Flagyl>>5/21 5/25: Zosyn>>  Significant Hospital Events: Including procedures, antibiotic start and stop dates in addition to other pertinent events   5/16: Admitted by hospitalist for acute metabolic encephalopathy in setting of severe hyponatremia and DTs.  Requiring hypertonic saline 5/18: Developed acute hypoxic respiratory failure, possible concern for aspiration.  Required intubation and mechanical ventilation. 5/19: Pt with worsening acute renal failure with severe metabolic and lactic acidosis requiring CRRT.  Severely hypotensive requiring epinephrine, levophed, and vasopressin gtts to maintain map >65  5/20: Remains critically ill, on CRRT. Vent support slightly weaned to 60% FiO2 & 10 PEEP. Some improvement in vasopressor requirements (off of epinephrine and phenylephrine) 5/21: Overnight weaned off of Giapreza, currently only on norepinephrine and vasopressin 5/22: No acute events overnight on minimal ventilator settings FiO2 30% PEEP 8.  Remains on levophed gtt to maintain map >65.  Will perform WUA  5/22: CT Head revealed No acute intracranial findings. No        change from CT 06/29/2021. 5/24: Transitioned off CRRT 5/25: Neuro exam remains poor off sedation, plan for MRI Brain.  New fever, obtain UA/TA/RUQ [place] & Doppler LE, line holiday (central line, trialysis, and A-line all removed). Empiric Zosyn started.  Neurology and ID consulted 5/26: Neuro exam slightly improving.  Fever resolved.  Plan to place new temporary HD catheter per Nephrology request.    Objective   Blood pressure 131/82, pulse 95, temperature 98.6 F (37 C), resp. rate (!) 21, height $RemoveBe'6\' 3"'dSdNtqfxg$  (1.905 m), weight (!) 149.7 kg, SpO2 91 %.    Vent Mode: PRVC FiO2 (%):  [28 %] 28 % Set Rate:  [  14 bmp] 14 bmp Vt Set:  [560 mL-650  mL] 650 mL PEEP:  [5 cmH20] 5 cmH20 Pressure Support:  [8 cmH20] 8 cmH20 Plateau Pressure:  [16 cmH20-22 cmH20] 18 cmH20   Intake/Output Summary (Last 24 hours) at 07/03/2021 1035 Last data filed at 07/03/2021 5329 Gross per 24 hour  Intake 1330.93 ml  Output 1200 ml  Net 130.93 ml    Filed Weights   07/01/21 1800 07/03/21 0500 07/03/21 0923  Weight: (!) 147.4 kg (!) 145.4 kg (!) 149.7 kg   Examination: General: Acutely ill-appearing obese male, laying in bed, NAD, on NO sedation, mechanically intubated  HENT: Atraumatic, normocephalic, neck supple, difficult to assess JVD due to body habitus Lungs: Coarse breath sounds throughout, even, non labored, synchronous with vent  Cardiovascular: Tachycardia, Regular rhythm,  no murmurs, rubs, gallops, 1+ generalized edema  Abdomen: +BS x4, obese, soft, non distended  Extremities: Normal bulk and tone Neuro: Off sedation, opens eyes to verbal stimulation, wiggles toes bilaterally (attempting to move UE but extremely weak), PERRL, scleredema  GU: Indwelling foley catheter draining yellow urine   Imaging     Assessment & Plan:   Acute hypoxic & hypercapnic respiratory failure in the setting of suspected aspiration in the setting of severe DTs Pulmonary Embolism - cannot r/o  -Full vent support, on lung protective strategies -Plateau pressures less than 30 cm H20 -Wean FiO2 as tolerated to maintain O2 sats >92% -Maintain PEEP  for left base atelectasis -Follow intermittent Chest X-ray & ABG as needed -Spontaneous Breathing Trials when respiratory parameters met and mental status permits -Continue VAP Bundle -PRN Bronchodilators -Venous US Bilateral LE negative for DVT x2  Septic shock~RESOLVED Elevated troponin suspect secondary to demand ischemia -Continuous cardiac monitoring -Maintain MAP 60-65 -Vasopressors as needed to maintain MAP goal -D/c Midodrine 5/25 -HS Troponin peaked at 67 -Echocardiogram 06/23/21: LVEF 60-65%,  Grade I DD, normal RV systolic function  Severe Sepsis due to Suspected aspiration~TREATED CT head 5/16 unable to exclude left otitis media and chronic bilateral maxillary sinusitis New Fever 5/25 ~ RESOLVED Temp (24hrs), Avg:99.6 F (37.6 C), Min:98.1 F (36.7 C), Max:101.1 F (38.4 C)  Lab Results  Component Value Date   WBC 11.0 (H) 07/03/2021   -Monitor fever curve -Trend WBC's & Procalcitonin -Follow cultures as above -Completed course of Unasyn  - ID following, continue empiric Zosyn for now as per ID -Line holiday given on 5/25  Hypotonic hyponatremia, suspect secondary to dehydration & poor p.o. intake~RESOLVED Acute kidney injury secondary to ATN with severe metabolic acidosis  Lactic acidosis persistent likely due to hepatic impairment Lab Results  Component Value Date   CREATININE 11.12 (H) 07/03/2021   BUN 132 (H) 07/03/2021   NA 141 07/03/2021   K 4.3 07/03/2021   CL 105 07/03/2021   CO2 18 (L) 07/03/2021   -Trend BMP, lactic acid, and ABG -Creatinine remains elevated but K and bicarb are normalized on HD -Persistent lactic acid elevation likely due to hepatic dysfunction, will not follow daily -Replace electrolytes as indicated  -Monitor UOP -Nephrology following, appreciate input: Transitioned off CRRT to intermittent HD 5/24  Transaminitis secondary to hepatic steatosis & ETOH abuse & Shock liver Lab Results  Component Value Date   AST 427 (H) 07/02/2021   AST 486 (H) 06/29/2021   AST 665 (H) 06/26/2021   -Trend LFTs, falling  Thrombocytopenia, suspect secondary to alcohol abuse Lab Results  Component Value Date   WBC 11.0 (H) 07/03/2021   HGB 12.1 (L) 07/03/2021  HCT 35.5 (L) 07/03/2021   MCV 111.6 (H) 07/03/2021   PLT 89 (L) 07/03/2021   -Trend CBC -Monitor for s/sx of bleeding and transfuse for Hgb <7 -Transfuse platelets for platelet count less than 50 with active bleeding  Incidental finding of left adrenal nodule via CT Abd/Pelvis   -Will need abdominal MRI for further evaluation in the outpatient setting   Acute metabolic encephalopathy in the setting of severe DTs and severe hyponatremia Sedation needs in the setting of mechanical ventilation -Maintain a RASS goal of 0 to -1 but attempt to ration sedating meds  -Avoid sedating medications as able -Continue thiamine  -Continue folic acid and multivitamin -Continue lactulose and rifaximin trend ammonia level  -MRI Brain 5/25 negative -Neurology consulted, appreciate input -EEG negative for seizures -Perhaps some following of commands and purposeful movement, which is improvement   Morbid obesity with sarcopenia Protein malnutrition -Calorie excess, protein deficiency in the setting of alcoholism -Dietitian consulted for initiation of TF's  Best Practice (right click and "Reselect all SmartList Selections" daily)  Diet/type: TF's DVT prophylaxis: Subq heparin  GI prophylaxis: PPI Lines: N/A Foley:  N/A Code Status: DNR  Last date of multidisciplinary goals of care discussion [06/30/21]  Update pts wife 5/26 at bedside.  All questions answered to her satisfaction.  Labs   CBC: Recent Labs  Lab 06/29/21 0330 06/30/21 0420 07/01/21 0524 07/02/21 0417 07/03/21 0445  WBC 13.9* 17.2* 14.3* 12.9* 11.0*  HGB 13.9 15.2 13.3 13.2 12.1*  HCT 41.7 44.6 39.9 39.3 35.5*  MCV 112.1* 111.8* 111.1* 110.7* 111.6*  PLT 62* 85* 89* 87* 89*     Basic Metabolic Panel: Recent Labs  Lab 06/28/21 1524 06/29/21 0330 06/30/21 0420 07/01/21 0524 07/02/21 0417 07/03/21 0445  NA 139 140 142 142 139 141  K 4.2 4.5 4.3 4.8 4.4 4.3  CL 107 109 107 106 103 105  CO2 23 22 20* 20* 20* 18*  GLUCOSE 141* 131* 151* 139* 121* 125*  BUN 38* 52* 90* 117* 121* 132*  CREATININE 2.19* 2.77* 6.04* 8.59* 8.45* 11.12*  CALCIUM 7.7* 7.3* 7.9* 7.8* 7.6* 7.9*  MG 2.6*  --  3.2* 3.3* 2.9* 3.4*  PHOS 2.9 3.2 5.3* 5.9* 5.7* 7.1*    GFR: Estimated Creatinine Clearance: 12.7 mL/min  (A) (by C-G formula based on SCr of 11.12 mg/dL (H)). Recent Labs  Lab 06/27/21 1001 06/27/21 2219 06/28/21 0335 06/30/21 0420 07/01/21 0524 07/02/21 0417 07/03/21 0445  WBC  --   --    < > 17.2* 14.3* 12.9* 11.0*  LATICACIDVEN 3.3* 3.0*  --   --   --   --   --    < > = values in this interval not displayed.     Liver Function Tests: Recent Labs  Lab 06/27/21 1631 06/28/21 0335 06/28/21 1524 06/29/21 0330 07/02/21 1150  AST  --   --   --  486* 427*  ALT  --   --   --  229* 247*  ALKPHOS  --   --   --  110 125  BILITOT  --   --   --  7.2* 4.6*  PROT  --   --   --  5.2* 5.5*  ALBUMIN 1.8* 1.9* 2.1* 1.9*  1.9* 1.8*    No results for input(s): LIPASE, AMYLASE in the last 168 hours.  Recent Labs  Lab 06/27/21 0357  AMMONIA 59*     ABG    Component Value Date/Time   PHART 7.41  06/29/2021 1130   PCO2ART 32 06/29/2021 1130   PO2ART 94 06/29/2021 1130   HCO3 20.3 06/29/2021 1130   ACIDBASEDEF 3.3 (H) 06/29/2021 1130   O2SAT 98.3 06/29/2021 1130      Coagulation Profile: No results for input(s): INR, PROTIME in the last 168 hours.   Cardiac Enzymes: Recent Labs  Lab 06/30/21 1956  CKTOTAL 347     HbA1C: No results found for: HGBA1C  CBG: Recent Labs  Lab 07/02/21 1527 07/02/21 1903 07/02/21 2258 07/03/21 0355 07/03/21 0758  GLUCAP 146* 127* 120* 122* 134*     Review of Systems:   Unable to assess due to intubation/sedation/critical illness  Allergies Allergies  Allergen Reactions   Sulfa Antibiotics      Home Medications  Prior to Admission medications   Medication Sig Start Date End Date Taking? Authorizing Provider  atorvastatin (LIPITOR) 20 MG tablet Take 20 mg by mouth daily. 02/03/21  Yes [provider]  cyanocobalamin 1000 MCG tablet Take by mouth.   Yes [provider]  hydrochlorothiazide (HYDRODIURIL) 25 MG tablet Take 25 mg by mouth daily. 05/30/21  Yes [provider]  lisinopril (ZESTRIL) 40  MG tablet Take 40 mg by mouth daily. 05/04/21  Yes [provider]  rOPINIRole (REQUIP) 0.25 MG tablet Take 0.25 mg by mouth 3 (three) times daily. 07/28/20  Yes [provider]  vitamin C (ASCORBIC ACID) 500 MG tablet Take 500 mg by mouth daily.   Yes [provider]  zolpidem (AMBIEN) 10 MG tablet Take 10 mg by mouth at bedtime as needed. 06/07/21  Yes [provider]     Scheduled Meds:  artificial tears   Both Eyes Q8H   chlorhexidine gluconate (MEDLINE KIT)  15 mL Mouth Rinse BID   Chlorhexidine Gluconate Cloth  6 each Topical Q0600   feeding supplement (PROSource TF)  90 mL Per Tube QID   folic acid  1 mg Intravenous Daily   free water  30 mL Per Tube Q4H   heparin injection (subcutaneous)  5,000 Units Subcutaneous Q12H   heparin sodium (porcine)       lactulose  20 g Per Tube TID   mouth rinse  15 mL Mouth Rinse 10 times per day   multivitamin with minerals  1 tablet Per Tube Daily   pantoprazole sodium  40 mg Per Tube Daily   rifaximin  550 mg Per Tube BID   thiamine injection  100 mg Intravenous Q24H   Continuous Infusions:  sodium chloride Stopped (06/29/21 2223)   feeding supplement (VITAL AF 1.2 CAL) 60 mL/hr at 07/02/21 1700   piperacillin-tazobactam (ZOSYN)  IV 2.25 g (07/03/21 0520)   PRN Meds:.acetaminophen **OR** acetaminophen, alteplase, heparin, heparin, labetalol, lidocaine (PF), lidocaine-prilocaine, ondansetron **OR** ondansetron (ZOFRAN) IV   Critical care provider statement:   Total critical care time: 33 minutes   Performed by: Lanney Gins MD   Critical care time was exclusive of separately billable procedures and treating other patients.   Critical care was necessary to treat or prevent imminent or life-threatening deterioration.   Critical care was time spent personally by me on the following activities: development of treatment plan with patient and/or surrogate as well as nursing, discussions with consultants,  evaluation of patient's response to treatment, examination of patient, obtaining history from patient or surrogate, ordering and performing treatments and interventions, ordering and review of laboratory studies, ordering and review of radiographic studies, pulse oximetry and re-evaluation of patient's condition.    Ottie Glazier, M.D.  Pulmonary & Critical Care Medicine

## 2021-07-03 NOTE — Progress Notes (Signed)
   Date of Admission:  07/05/2021     ID: Treyden Hakim is a 51 y.o. male  Principal Problem:   Acute metabolic encephalopathy Active Problems:   Acute kidney injury ([place])   Hyponatremia   Alcohol withdrawal (H[facility])   Transaminitis    Subjective: Pt remains intubated   Medications:   artificial tears   Both Eyes Q8H   chlorhexidine gluconate (MEDLINE KIT)  15 mL Mouth Rinse BID   Chlorhexidine Gluconate Cloth  6 each Topical Q0600   feeding supplement (PROSource TF)  90 mL Per Tube QID   folic acid  1 mg Intravenous Daily   free water  30 mL Per Tube Q4H   heparin injection (subcutaneous)  5,000 Units Subcutaneous Q12H   lactulose  20 g Per Tube TID   mouth rinse  15 mL Mouth Rinse 10 times per day   multivitamin with minerals  1 tablet Per Tube Daily   pantoprazole sodium  40 mg Per Tube Daily   rifaximin  550 mg Per Tube BID   thiamine injection  100 mg Intravenous Q24H    Objective: Vital signs in last 24 hours BP 123/72 (BP Location: Right Arm)   Pulse 96   Temp 99.3 F (37.4 C)   Resp 19   Ht _0  (1.905 m)   Wt (!) 147.9 kg   SpO2 93%   BMI 40.75 kg/m     PHYSICAL EXAM:  General: intubated , but awake and follows some commands  Lungs: b/l air entry NG feeds Heart:Tachycardia Abdomen: Soft, non-tender,not distended. Bowel sounds normal. No masses Extremities: atraumatic, no cyanosis. No edema. No clubbing Skin: No rashes or lesions. Or bruising Lymph: Cervical, supraclavicular normal. Neurologic: cannot be assessed  Lab Results Recent Labs    07/02/21 0417 07/03/21 0445  WBC 12.9* 11.0*  HGB 13.2 12.1*  HCT 39.3 35.5*  NA 139 141  K 4.4 4.3  CL 103 105  CO2 20* 18*  BUN 121* 132*  CREATININE 8.45* 11.12*   Liver Panel Recent Labs    07/02/21 1150  PROT 5.5*  ALBUMIN 1.8*  AST 427*  ALT 247*  ALKPHOS 125  BILITOT 4.6*  BILIDIR 2.5*  IBILI 2.1*     Microbiology: Tracheal aspirate culture- candida  dubliniensis    Assessment/Plan: Acute metabolic encephalopathy on presentation due to hyponatremia and alcohol withdrawal-improved  Acute hypoxia with hypercapnic resp failure- intubated  Fever after a week in the hospital- foley and central lines removed Fluctuates Leucocytosis has resolved  Aspiration pneumonia on zosyn  AKI/ Anuria Back on dialysis   Liver failure- multifactorial - alcohol, tylenol, shock liver  AUD with Dts    CT scan shows left adrenal adenoma  Discussed the management with care team

## 2021-07-04 DIAGNOSIS — N179 Acute kidney failure, unspecified: Secondary | ICD-10-CM | POA: Diagnosis not present

## 2021-07-04 DIAGNOSIS — R7401 Elevation of levels of liver transaminase levels: Secondary | ICD-10-CM | POA: Diagnosis not present

## 2021-07-04 DIAGNOSIS — G6281 Critical illness polyneuropathy: Secondary | ICD-10-CM

## 2021-07-04 DIAGNOSIS — G9341 Metabolic encephalopathy: Secondary | ICD-10-CM | POA: Diagnosis not present

## 2021-07-04 LAB — CBC
HCT: 35.1 % — ABNORMAL LOW (ref 39.0–52.0)
Hemoglobin: 12 g/dL — ABNORMAL LOW (ref 13.0–17.0)
MCH: 38 pg — ABNORMAL HIGH (ref 26.0–34.0)
MCHC: 34.2 g/dL (ref 30.0–36.0)
MCV: 111.1 fL — ABNORMAL HIGH (ref 80.0–100.0)
Platelets: 101 10*3/uL — ABNORMAL LOW (ref 150–400)
RBC: 3.16 MIL/uL — ABNORMAL LOW (ref 4.22–5.81)
RDW: 19.4 % — ABNORMAL HIGH (ref 11.5–15.5)
WBC: 9.1 10*3/uL (ref 4.0–10.5)
nRBC: 0.3 % — ABNORMAL HIGH (ref 0.0–0.2)

## 2021-07-04 LAB — RENAL FUNCTION PANEL
Albumin: 2.2 g/dL — ABNORMAL LOW (ref 3.5–5.0)
Anion gap: 18 — ABNORMAL HIGH (ref 5–15)
BUN: 124 mg/dL — ABNORMAL HIGH (ref 6–20)
CO2: 19 mmol/L — ABNORMAL LOW (ref 22–32)
Calcium: 7.8 mg/dL — ABNORMAL LOW (ref 8.9–10.3)
Chloride: 101 mmol/L (ref 98–111)
Creatinine, Ser: 9.49 mg/dL — ABNORMAL HIGH (ref 0.61–1.24)
GFR, Estimated: 6 mL/min — ABNORMAL LOW (ref >60)
Glucose, Bld: 129 mg/dL — ABNORMAL HIGH (ref 70–99)
Phosphorus: 6.8 mg/dL — ABNORMAL HIGH (ref 2.5–4.6)
Potassium: 3.9 mmol/L (ref 3.5–5.1)
Sodium: 138 mmol/L (ref 135–145)

## 2021-07-04 LAB — GLUCOSE, CAPILLARY
Glucose-Capillary: 131 mg/dL — ABNORMAL HIGH (ref 70–99)
Glucose-Capillary: 149 mg/dL — ABNORMAL HIGH (ref 70–99)
Glucose-Capillary: 130 mg/dL — ABNORMAL HIGH (ref 70–99)
Glucose-Capillary: 142 mg/dL — ABNORMAL HIGH (ref 70–99)
Glucose-Capillary: 132 mg/dL — ABNORMAL HIGH (ref 70–99)
Glucose-Capillary: 120 mg/dL — ABNORMAL HIGH (ref 70–99)

## 2021-07-04 LAB — MAGNESIUM: Magnesium: 3 mg/dL — ABNORMAL HIGH (ref 1.7–2.4)

## 2021-07-04 MED ORDER — RENA-VITE PO TABS
1.0000 | ORAL_TABLET | Freq: Every day | ORAL | Status: DC
  Administered 2021-07-04: 1
  Filled 2021-07-04: qty 1

## 2021-07-04 MED ORDER — KETOROLAC TROMETHAMINE 15 MG/ML IJ SOLN
30.0000 mg | Freq: Once | INTRAMUSCULAR | Status: AC
  Administered 2021-07-04: 30 mg via INTRAVENOUS
  Filled 2021-07-04: qty 2

## 2021-07-04 MED ORDER — HEPARIN SODIUM (PORCINE) 1000 UNIT/ML IJ SOLN
INTRAMUSCULAR | Status: AC
  Filled 2021-07-04: qty 10

## 2021-07-04 NOTE — Progress Notes (Signed)
[facility]  PROGRESS NOTE   Subjective:      Patient remains intubated on vent Opens eyes with name and is able to follow simple commands Vent settings 28 FiO2 with 5 PEEP Remains off pressors Flexi-Seal remain in place. Tube feeds 60 mL/h Generalized edema Afebrile Foley has been removed  Objective:  Vital signs: Blood pressure 125/81, pulse 99, temperature 99.5 F (37.5 C), temperature source Esophageal, resp. rate (!) 27, height $RemoveBe'6\' 3"'iMdRwZdyb$  (1.905 m), weight (!) 146.3 kg, SpO2 94 %.  Intake/Output Summary (Last 24 hours) at 07/04/2021 1624 Last data filed at 07/04/2021 1246 Gross per 24 hour  Intake 809.07 ml  Output 1900 ml  Net -1090.93 ml    Filed Weights   07/03/21 0923 07/03/21 1230 07/04/21 0500  Weight: (!) 149.7 kg (!) 147.9 kg (!) 146.3 kg     Physical Exam: General: Critically ill  Head:  Normocephalic, atraumatic.  Eyes: Jaundiced conjunctival edema  Lungs:  Rhonchi/wheeze throughout, normal effort  Heart:  S1S2 no rubs, tachy  Abdomen:  Firm, nontender, bowel sounds present  Extremities:  3+ peripheral edema.  Neurologic: Able to follow simple commands  Skin:  No lesions  Access: Left femoral trialysis catheter placed on 6/62/9476    Basic Metabolic Panel: Recent Labs  Lab 06/30/21 0420 07/01/21 0524 07/02/21 0417 07/03/21 0445 07/04/21 0430  NA 142 142 139 141 138  K 4.3 4.8 4.4 4.3 3.9  CL 107 106 103 105 101  CO2 20* 20* 20* 18* 19*  GLUCOSE 151* 139* 121* 125* 129*  BUN 90* 117* 121* 132* 124*  CREATININE 6.04* 8.59* 8.45* 11.12* 9.49*  CALCIUM 7.9* 7.8* 7.6* 7.9* 7.8*  MG 3.2* 3.3* 2.9* 3.4* 3.0*  PHOS 5.3* 5.9* 5.7* 7.1* 6.8*     CBC: Recent Labs  Lab 06/30/21 0420 07/01/21 0524 07/02/21 0417 07/03/21 0445 07/04/21 0430  WBC 17.2* 14.3* 12.9* 11.0* 9.1  HGB 15.2 13.3 13.2 12.1* 12.0*  HCT 44.6 39.9 39.3 35.5* 35.1*  MCV 111.8* 111.1* 110.7* 111.6* 111.1*  PLT 85* 89* 87* 89* 101*      Urinalysis: No  results for input(s): COLORURINE, LABSPEC, PHURINE, GLUCOSEU, HGBUR, BILIRUBINUR, KETONESUR, PROTEINUR, UROBILINOGEN, NITRITE, LEUKOCYTESUR in the last 72 hours.  Invalid input(s): APPERANCEUR     Imaging: No results found.   Medications:    sodium chloride Stopped (06/29/21 2223)   feeding supplement (VITAL AF 1.2 CAL) 1,000 mL (07/04/21 1348)   piperacillin-tazobactam (ZOSYN)  IV 2.25 g (07/04/21 1350)    artificial tears   Both Eyes Q8H   chlorhexidine gluconate (MEDLINE KIT)  15 mL Mouth Rinse BID   Chlorhexidine Gluconate Cloth  6 each Topical Q0600   feeding supplement (PROSource TF)  90 mL Per Tube QID   folic acid  1 mg Intravenous Daily   free water  30 mL Per Tube Q4H   heparin injection (subcutaneous)  5,000 Units Subcutaneous Q12H   heparin sodium (porcine)       lactulose  20 g Per Tube TID   mouth rinse  15 mL Mouth Rinse 10 times per day   multivitamin  1 tablet Per Tube QHS   pantoprazole sodium  40 mg Per Tube Daily   rifaximin  550 mg Per Tube BID   thiamine injection  100 mg Intravenous Q24H    Assessment/ Plan:     Principal Problem:   Acute metabolic encephalopathy Active Problems:   Acute kidney injury ([place])   Hyponatremia   Alcohol withdrawal (  [place])   Transaminitis   51 y.o.  male with past medical history including alcohol abuse, chronic back pain, and peripheral neuropathy, who was admitted to [facility] on 06/19/2021 for Hyponatremia, Elevated liver function tests. Generalized weakness, AKI (acute kidney injury) and Acute metabolic encephalopathy.    #1: Acute kidney injury due to ETOH abuse, poor nutrition and severe illness.  Required CRRT during this admission.   Patient has now been transitioned to intermittent hemodialysis.  HD today to correct uremia.  Most likely patient will need dialysis again on Wednesday depending on labs.   #2: Hypotension/shock: Remains off pressors at this time.   #3: Generalized edema-volume removal with hemodialysis  as tolerated.  Patient is currently anuric. Iv albumin during dialysis for oncotic support.  Will attempt to manage edema with dialysis, as tolerated.   #4: Acute respiratory failure with possible pneumonia: Intubated with antibiotic therapy.  ID following  #5: Transaminitis secondary to liver failure/shock liver: Most likely secondary to EtOH abuse, critical illness and hypotension.  Supportive care   LOS: [facility] 5/30/20234:24 PM

## 2021-07-04 NOTE — Progress Notes (Signed)
Subjective: Follows some commands given by RN and wife.   Objective: Current vital signs: BP 138/86   Pulse (!) 104   Temp 99.9 F (37.7 C)   Resp (!) 27   Ht $R'6\' 3"'TD$  (1.905 m)   Wt (!) 146.3 kg   SpO2 94%   BMI 40.31 kg/m  Vital signs in last 24 hours: Temp:  [98.4 F (36.9 C)-100.6 F (38.1 C)] 99.9 F (37.7 C) (05/30 0700) Pulse Rate:  [95-108] 104 (05/30 0700) Resp:  [15-30] 27 (05/30 0700) BP: (111-147)/(66-91) 138/86 (05/30 0700) SpO2:  [89 %-95 %] 94 % (05/30 0700) FiO2 (%):  [28 %] 28 % (05/30 0227) Weight:  [146.3 kg-149.7 kg] 146.3 kg (05/30 0500)  Intake/Output from previous day: 05/29 0701 - 05/30 0700 In: 859.1 [NG/GT:759.1; IV Piggyback:100] Out: 2200 [Stool:1200] Intake/Output this shift: No intake/output data recorded. Nutritional status:  Diet Order             Diet NPO time specified  Diet effective now                  HEENT: [place]/AT Lungs: Intubated Ext: Warm and well perfused  Neurologic Exam: Ment: Follows only simple commands such as gripping with hands and wiggling toes. Essentially no spontaneous movements.  CN: PERRL. Eyes are conjugate near the midline, with some spontaneous movement, but not actively tracking or initiating saccades. Weak oculocephalic reflex bilaterally. Face is symmetric at rest.  Motor: Severe weakness x 4. No movement of limbs proximally, rated at 0/5. Will weakly grip and wiggle toes bilaterally, rated as 1-2/5 distally x 4. Decreased tone x 4.  Sensory: Shows increased eye movements and change to respiratory rate in response to noxious stimuli applied to each extremity, but does not withdraw.  Reflexes: Trace brachioradialis bilaterally. 0 bilateral patellae and achilles. Toes mute bilaterally.  Cerebellar/Gait: Unable to assess  Lab Results: Results for orders placed or performed during the hospital encounter of 06/28/2021 (from the past 48 hour(s))  Glucose, capillary     Status: Abnormal   Collection Time:  07/02/21 11:09 AM  Result Value Ref Range   Glucose-Capillary 124 (H) 70 - 99 mg/dL    Comment: Glucose reference range applies only to samples taken after fasting for at least 8 hours.  Hepatic function panel     Status: Abnormal   Collection Time: 07/02/21 11:50 AM  Result Value Ref Range   Total Protein 5.5 (L) 6.5 - 8.1 g/dL   Albumin 1.8 (L) 3.5 - 5.0 g/dL   AST 427 (H) 15 - 41 U/L    Comment: HEMOLYSIS AT THIS LEVEL MAY AFFECT RESULT   ALT 247 (H) 0 - 44 U/L    Comment: HEMOLYSIS AT THIS LEVEL MAY AFFECT RESULT   Alkaline Phosphatase 125 38 - 126 U/L   Total Bilirubin 4.6 (H) 0.3 - 1.2 mg/dL    Comment: HEMOLYSIS AT THIS LEVEL MAY AFFECT RESULT   Bilirubin, Direct 2.5 (H) 0.0 - 0.2 mg/dL    Comment: HEMOLYSIS AT THIS LEVEL MAY AFFECT RESULT   Indirect Bilirubin 2.1 (H) 0.3 - 0.9 mg/dL    Comment: Performed at [facility], [address] [place], [place] [address]  Glucose, capillary     Status: Abnormal   Collection Time: 07/02/21  3:27 PM  Result Value Ref Range   Glucose-Capillary 146 (H) 70 - 99 mg/dL    Comment: Glucose reference range applies only to samples taken after fasting for at least 8 hours.  Glucose, capillary     Status: Abnormal   Collection Time: 07/02/21  7:03 PM  Result Value Ref Range   Glucose-Capillary 127 (H) 70 - 99 mg/dL    Comment: Glucose reference range applies only to samples taken after fasting for at least 8 hours.  Glucose, capillary     Status: Abnormal   Collection Time: 07/02/21 10:58 PM  Result Value Ref Range   Glucose-Capillary 120 (H) 70 - 99 mg/dL    Comment: Glucose reference range applies only to samples taken after fasting for at least 8 hours.  Glucose, capillary     Status: Abnormal   Collection Time: 07/03/21  3:55 AM  Result Value Ref Range   Glucose-Capillary 122 (H) 70 - 99 mg/dL    Comment: Glucose reference range applies only to samples taken after fasting for at least 8 hours.  Basic metabolic panel      Status: Abnormal   Collection Time: 07/03/21  4:45 AM  Result Value Ref Range   Sodium 141 135 - 145 mmol/L   Potassium 4.3 3.5 - 5.1 mmol/L   Chloride 105 98 - 111 mmol/L   CO2 18 (L) 22 - 32 mmol/L   Glucose, Bld 125 (H) 70 - 99 mg/dL    Comment: Glucose reference range applies only to samples taken after fasting for at least 8 hours.   BUN 132 (H) 6 - 20 mg/dL    Comment: RESULT CONFIRMED BY MANUAL DILUTION DLB   Creatinine, Ser 11.12 (H) 0.61 - 1.24 mg/dL   Calcium 7.9 (L) 8.9 - 10.3 mg/dL   GFR, Estimated 5 (L) >60 mL/min    Comment: (NOTE) Calculated using the CKD-EPI Creatinine Equation (2021)    Anion gap 18 (H) 5 - 15    Comment: Performed at [facility], [address] [place], [place] [address]  Phosphorus     Status: Abnormal   Collection Time: 07/03/21  4:45 AM  Result Value Ref Range   Phosphorus 7.1 (H) 2.5 - 4.6 mg/dL    Comment: Performed at [facility], [address] [place], [place] [address]  Magnesium     Status: Abnormal   Collection Time: 07/03/21  4:45 AM  Result Value Ref Range   Magnesium 3.4 (H) 1.7 - 2.4 mg/dL    Comment: Performed at [facility], [address] [place], [place] [address]  CBC     Status: Abnormal   Collection Time: 07/03/21  4:45 AM  Result Value Ref Range   WBC 11.0 (H) 4.0 - 10.5 K/uL   RBC 3.18 (L) 4.22 - 5.81 MIL/uL   Hemoglobin 12.1 (L) 13.0 - 17.0 g/dL   HCT 35.5 (L) 39.0 - 52.0 %   MCV 111.6 (H) 80.0 - 100.0 fL   MCH 38.1 (H) 26.0 - 34.0 pg   MCHC 34.1 30.0 - 36.0 g/dL   RDW 20.3 (H) 11.5 - 15.5 %   Platelets 89 (L) 150 - 400 K/uL    Comment: Immature Platelet Fraction may be clinically indicated, consider ordering this additional test [id number]    nRBC 0.4 (H) 0.0 - 0.2 %    Comment: Performed at [facility], [address] [place],  [address]  Lipase, blood     Status: Abnormal   Collection Time: 07/03/21  4:45 AM  Result Value Ref Range   Lipase 227 (H) 11  - 51 U/L    Comment: Performed at [facility], [address] [place],  [place] [address]  Glucose, capillary     Status: Abnormal   Collection Time: 07/03/21  7:58 AM  Result Value Ref Range   Glucose-Capillary 134 (H) 70 - 99 mg/dL    Comment: Glucose reference range applies only to samples taken after fasting for at least 8 hours.  Glucose, capillary     Status: Abnormal   Collection Time: 07/03/21 11:43 AM  Result Value Ref Range   Glucose-Capillary 102 (H) 70 - 99 mg/dL    Comment: Glucose reference range applies only to samples taken after fasting for at least 8 hours.  Glucose, capillary     Status: Abnormal   Collection Time: 07/03/21  3:34 PM  Result Value Ref Range   Glucose-Capillary 107 (H) 70 - 99 mg/dL    Comment: Glucose reference range applies only to samples taken after fasting for at least 8 hours.  Glucose, capillary     Status: Abnormal   Collection Time: 07/03/21  7:42 PM  Result Value Ref Range   Glucose-Capillary 123 (H) 70 - 99 mg/dL    Comment: Glucose reference range applies only to samples taken after fasting for at least 8 hours.  Glucose, capillary     Status: Abnormal   Collection Time: 07/03/21 11:38 PM  Result Value Ref Range   Glucose-Capillary 138 (H) 70 - 99 mg/dL    Comment: Glucose reference range applies only to samples taken after fasting for at least 8 hours.  Glucose, capillary     Status: Abnormal   Collection Time: 07/04/21  4:21 AM  Result Value Ref Range   Glucose-Capillary 131 (H) 70 - 99 mg/dL    Comment: Glucose reference range applies only to samples taken after fasting for at least 8 hours.  CBC     Status: Abnormal   Collection Time: 07/04/21  4:30 AM  Result Value Ref Range   WBC 9.1 4.0 - 10.5 K/uL   RBC 3.16 (L) 4.22 - 5.81 MIL/uL   Hemoglobin 12.0 (L) 13.0 - 17.0 g/dL   HCT 35.1 (L) 39.0 - 52.0 %   MCV 111.1 (H) 80.0 - 100.0 fL   MCH 38.0 (H) 26.0 - 34.0 pg   MCHC 34.2 30.0 - 36.0 g/dL   RDW 19.4 (H) 11.5 -  15.5 %   Platelets 101 (L) 150 - 400 K/uL    Comment: Immature Platelet Fraction may be clinically indicated, consider ordering this additional test [id number] REPEATED TO VERIFY    nRBC 0.3 (H) 0.0 - 0.2 %    Comment: Performed at [facility], [address] [place], [place] [address]  Renal function panel     Status: Abnormal   Collection Time: 07/04/21  4:30 AM  Result Value Ref Range   Sodium 138 135 - 145 mmol/L   Potassium 3.9 3.5 - 5.1 mmol/L   Chloride 101 98 - 111 mmol/L   CO2 19 (L) 22 - 32 mmol/L   Glucose, Bld 129 (H) 70 - 99 mg/dL    Comment: Glucose reference range applies only to samples taken after fasting for at least 8 hours.   BUN 124 (H) 6 - 20 mg/dL    Comment: RESULT CONFIRMED BY MANUAL DILUTION MW   Creatinine, Ser 9.49 (H) 0.61 - 1.24 mg/dL   Calcium 7.8 (L) 8.9 - 10.3 mg/dL   Phosphorus 6.8 (H) 2.5 - 4.6 mg/dL   Albumin 2.2 (L) 3.5 - 5.0 g/dL   GFR, Estimated 6 (L) >60 mL/min    Comment: (  NOTE) Calculated using the CKD-EPI Creatinine Equation (2021)    Anion gap 18 (H) 5 - 15    Comment: Performed at [facility], [address] [place], [place] [address]  Magnesium     Status: Abnormal   Collection Time: 07/04/21  4:30 AM  Result Value Ref Range   Magnesium 3.0 (H) 1.7 - 2.4 mg/dL    Comment: Performed at [facility], [address] [place], [place] [address]  Glucose, capillary     Status: Abnormal   Collection Time: 07/04/21  7:46 AM  Result Value Ref Range   Glucose-Capillary 149 (H) 70 - 99 mg/dL    Comment: Glucose reference range applies only to samples taken after fasting for at least 8 hours.    Recent Results (from the past 240 hour(s))  Culture, Respiratory w Gram Stain     Status: None   Collection Time: 06/29/21  9:48 AM   Specimen: Tracheal Aspirate; Respiratory  Result Value Ref Range Status   Specimen Description   Final    TRACHEAL ASPIRATE Performed at [facility], [address], [place], [place] [address]    Special Requests   Final    NONE Performed at [facility], [address] [place], [place] [address]    Gram Stain   Final    MODERATE SQUAMOUS EPITHELIAL CELLS PRESENT FEW WBC PRESENT, PREDOMINANTLY PMN RARE YEAST WITH PSEUDOHYPHAE RARE GRAM NEGATIVE RODS    Culture   Final    FEW CANDIDA DUBLINIENSIS NO STAPHYLOCOCCUS AUREUS ISOLATED No Pseudomonas species isolated Performed at [facility], [place] [address], [place], [place] [address]    Report Status 07/02/2021 FINAL  Final  MRSA Next Gen by PCR, Nasal     Status: None   Collection Time: 06/29/21  9:24 PM   Specimen: Nasal Mucosa; Nasal Swab  Result Value Ref Range Status   MRSA by PCR Next Gen NOT DETECTED NOT DETECTED Final    Comment: (NOTE) The GeneXpert MRSA Assay (FDA approved for NASAL specimens only), is one component of a comprehensive MRSA colonization surveillance program. It is not intended to diagnose MRSA infection nor to guide or monitor treatment for MRSA infections. Test performance is not FDA approved in patients less than 49 years old. Performed at [facility], [address] [place],  [address]     Lipid Panel No results for input(s): CHOL, TRIG, HDL, CHOLHDL, VLDL, LDLCALC in the last 72 hours.  Studies/Results: No results found.  Medications: Scheduled:  artificial tears   Both Eyes Q8H   chlorhexidine gluconate (MEDLINE KIT)  15 mL Mouth Rinse BID   Chlorhexidine Gluconate Cloth  6 each Topical Q0600   feeding supplement (PROSource TF)  90 mL Per Tube QID   folic acid  1 mg Intravenous Daily   free water  30 mL Per Tube Q4H   heparin injection (subcutaneous)  5,000 Units Subcutaneous Q12H   heparin sodium (porcine)       lactulose  20 g Per Tube TID   mouth rinse  15 mL Mouth Rinse 10 times per day   multivitamin with minerals  1 tablet Per Tube Daily   pantoprazole sodium  40 mg Per Tube Daily   rifaximin  550 mg Per Tube  BID   thiamine injection  100 mg Intravenous Q24H   Continuous:  sodium chloride Stopped (06/29/21 2223)   feeding supplement (VITAL AF 1.2 CAL) 60 mL/hr at 07/03/21 1800  piperacillin-tazobactam (ZOSYN)  IV 2.25 g (07/04/21 0017)    Assessment: 51 yo male admitted on 5/16 with acute metabolic encephalopathy in the setting of EtOH withdrawal and hyponatremia. Hospital course complicated by aspiration PNA on unasyn, AKI with anuria requiring CRRT now transitioning to HD, acute liver failure likely multifactorial 2/2 EtOH, tylenol, and shock liver, as well as hypotension requiring pressors. He was hyponatremic at 109 on admission which was corrected at appropriate rate; now normonatremic. Neurology was initially consulted for failure to wake up after being off sedation for 3 days. At time of our initial consultation on 3/26 he was able to follow some simple commands including wiggling toes on both feet. His mentation then declined and as of Sunday 5/28 he was no longer following commands in the setting of active infection and ongoing acute renal failure and liver failure.  - Exam today with findings most consistent with an encephalopathy, but improved since prior documented Neurology exam on Sunday. Also with findings of weakness and hyporeflexia that most likely reflect a critical illness polyneuropathy.  - Has been continued on standard-dose IV thiamine injections at 100 mg qd following initial high-dose therapy - Labs today reveal BUN 124, Cr 9.49, Ca 7.8, phosphorus 6.8, Mg 3, albumin 2.2. Recent AST and ALT 427 and 247 respectively, with elevated total bilirubin and low total protein. Na continues to be normal today at 138. WBC normalized today to 9.1. Platelets low at 101 but trending upwards. Hgb at 12 with elevated MCV c/w mild anemia.    Recommendations: - No further specific neurologic workup recommended today.  - Continue towards goal of weaning off the vent - When weaned off the vent,  mobilize to chair or to a seated position in the bed as soon as possible, in conjunction with PT and OT, to decrease the risk of further progression of what is most likely a critical illness polyneuropathy.   35 minutes spent in the neurological evaluation and management of this critically ill patient.    LOS: 14 days   '@Electronically'$  signed: Dr. Kerney Elbe 07/04/2021  8:51 AM

## 2021-07-04 NOTE — Progress Notes (Signed)
PT Cancellation Note  Patient Details Name: Ewell Benassi MRN: [id number] DOB: 11-01-1970   Cancelled Treatment:    Reason Eval/Treat Not Completed: Other (comment): Pt with L temporary femoral catheter and per protocol bed mobility is contraindicated at this time.  Will attempt to see pt at a future date/time as medically appropriate.    Linus Salmons PT, DPT 07/04/21, 9:17 AM

## 2021-07-04 NOTE — Progress Notes (Signed)
Hemodialysis Post Treatment Note:   Tx date: 07/04/2021  Tx time: 3 hours   Access: left femoral catheter  UF Removed: 1 L    Note: HD completed. Tolerated well. No complications. Patient hemodynamically.

## 2021-07-04 NOTE — Consult Note (Signed)
[name] for Electrolyte Monitoring and Replacement   Recent Labs: Potassium (mmol/L)  Date Value  07/04/2021 3.9   Magnesium (mg/dL)  Date Value  07/04/2021 3.0 (H)   Calcium (mg/dL)  Date Value  07/04/2021 7.8 (L)   Albumin (g/dL)  Date Value  07/04/2021 2.2 (L)   Phosphorus (mg/dL)  Date Value  07/04/2021 6.8 (H)   Sodium (mmol/L)  Date Value  07/04/2021 138   Assessment: Patient is a 51 y/o M with medical history including obesity, chronic back pain, EtOH use disorder who presented to the ED 5/16 with weakness in setting of abdominal cramps, nausea, vomiting, and diarrhea. Patient subsequently admitted for severe symptomatic hyponatremia. Patient decompensated on 5/18 with acute onset respiratory failure requiring intubation. Patient remains admitted to the ICU where he is intubated and on mechanical ventilation.  Admission further complicated by acute renal failure necessitating initiation of CRRT on 5/19 and subsequently transitioned to HD. Pharmacy consulted to assist with electrolyte monitoring and replacement as indicated.  Nutrition: Tube feeds initiated 5/22 Cathartics: Lactulose 20 g TID for hepatic encephalopathy  Goal of Therapy:  Electrolytes within normal limits  Plan:  --No electrolyte replacement indicated at this time --Follow-up electrolytes with AM labs tomorrow  Robert Coffey 07/04/2021 7:09 AM

## 2021-07-04 NOTE — Progress Notes (Signed)
   Date of Admission:  06/19/2021     Subjective: Pt remains intubated   Medications:   artificial tears   Both Eyes Q8H   chlorhexidine gluconate (MEDLINE KIT)  15 mL Mouth Rinse BID   Chlorhexidine Gluconate Cloth  6 each Topical Q0600   feeding supplement (PROSource TF)  90 mL Per Tube QID   folic acid  1 mg Intravenous Daily   free water  30 mL Per Tube Q4H   heparin injection (subcutaneous)  5,000 Units Subcutaneous Q12H   heparin sodium (porcine)       lactulose  20 g Per Tube TID   mouth rinse  15 mL Mouth Rinse 10 times per day   multivitamin  1 tablet Per Tube QHS   pantoprazole sodium  40 mg Per Tube Daily   rifaximin  550 mg Per Tube BID   thiamine injection  100 mg Intravenous Q24H    Objective: Vital signs in last 24 hours BP 125/81 (BP Location: Right Arm)   Pulse 99   Temp 99.5 F (37.5 C) (Esophageal)   Resp (!) 27   Ht _0  (1.905 m)   Wt (!) 146.3 kg   SpO2 94%   BMI 40.31 kg/m     PHYSICAL EXAM:  General: intubated , sedated  lungs: b/l air entry NG feeds Heart: S1-S2 Abdomen: Soft, non-tender,not distended. Bowel sounds normal. No masses Extremities: atraumatic, no cyanosis. No edema. No clubbing Skin: No rashes or lesions. Or bruising Lymph: Cervical, supraclavicular normal. Neurologic: cannot be assessed  Lab Results Recent Labs    07/03/21 0445 07/04/21 0430  WBC 11.0* 9.1  HGB 12.1* 12.0*  HCT 35.5* 35.1*  NA 141 138  K 4.3 3.9  CL 105 101  CO2 18* 19*  BUN 132* 124*  CREATININE 11.12* 9.49*   Liver Panel Recent Labs    07/02/21 1150 07/04/21 0430  PROT 5.5*  --   ALBUMIN 1.8* 2.2*  AST 427*  --   ALT 247*  --   ALKPHOS 125  --   BILITOT 4.6*  --   BILIDIR 2.5*  --   IBILI 2.1*  --      Microbiology: Tracheal aspirate culture- candida dubliniensis    Assessment/Plan: Acute metabolic encephalopathy on presentation due to hyponatremia and alcohol withdrawal-improved  Acute hypoxia with hypercapnic resp  failure- intubated  Fever Fluctuates-foley and central lines removed  Leucocytosis has resolved  Aspiration pneumonia on zosyn.  Has completed 6 days.  We will discontinue after tomorrow  AKI/ Anuria Back on dialysis   Liver failure- multifactorial - alcohol, tylenol, shock liver  AUD with Dts    CT scan shows left adrenal adenoma  Discussed the management with care team

## 2021-07-04 NOTE — Progress Notes (Signed)
NAME:  Robert Coffey, MRN:  [id number], DOB:  [birth year], LOS: 38 ADMISSION DATE:  06/16/2021, CONSULTATION DATE:  06/22/2021 REFERRING MD:  Dr. Jimmye Norman, CHIEF COMPLAINT:  Acute Respiratory Distress, AMS   Brief Pt Description / Synopsis:  51 year old male admitted with acute metabolic encephalopathy secondary to alcohol withdrawal and severe hyponatremia requiring hypertonic saline.  On 5/18 developed acute hypoxic & hypercapnic respiratory failure requiring intubation and mechanical ventilation.  07/03/21- patient mentation improved, he is s/p HD today. Renal function worse overnight.   07/04/21- patient is clinically improved following verbal communication.  For HD today.  PPneumonia is improved.  Patient did not pass SBT today post dialysis.   History of Present Illness:  Robert Coffey is a 51 year old male with a past medical history significant for alcohol abuse, obesity, chronic back pain, peripheral neuropathy who presented to [facility] ED on 06/25/2021 due to altered mental status, poor p.o. intake, progressive weakness with difficulty getting around at home.  Patient is now intubated and unable to contribute to history, therefore history is obtained from patient's wife at bedside and chart review.  Per the patient's wife, the patient has not been feeling well with poor p.o. intake for approximately a month, however has continued to drink alcohol daily (usually 3-4 drinks of liquor) during that time.  Over the past several days he has become progressively weak with difficulty getting around at home (not even in bed able to get out of bed), intermittent confusion, and diffuse abdominal cramping/nausea/vomiting/diarrhea.  Denies any history of falls, chest pain, shortness of breath, fever, cough.  Last known alcoholic drink was the day prior to admission on Sunday.  Pertinent  Medical History  Alcohol abuse Obesity Chronic back  Micro Data:  5/16: SARS-CoV-2 and influenza PCR>> negative 5/16:  HIV screen>> nonreactive 5/16: MRSA PCR>> negative 5/18: Tracheal aspirate>>normal respiratory flora 5/19: Blood x2>>negative 5/25: Tracheal aspirate>>rare gram - rods, rare yeast with pseudohyphae 5/25: MRSA PCR>> negative  Antimicrobials:  5/18: Unasyn>>x2 doses, restarted 5/22>>5/24 5/18: Cefepime>>5/19 5/18: Vancomycin>>5/19 5/18: Flagyl>>5/21 5/25: Zosyn>>  Significant Hospital Events: Including procedures, antibiotic start and stop dates in addition to other pertinent events   5/16: Admitted by hospitalist for acute metabolic encephalopathy in setting of severe hyponatremia and DTs.  Requiring hypertonic saline 5/18: Developed acute hypoxic respiratory failure, possible concern for aspiration.  Required intubation and mechanical ventilation. 5/19: Pt with worsening acute renal failure with severe metabolic and lactic acidosis requiring CRRT.  Severely hypotensive requiring epinephrine, levophed, and vasopressin gtts to maintain map >65  5/20: Remains critically ill, on CRRT. Vent support slightly weaned to 60% FiO2 & 10 PEEP. Some improvement in vasopressor requirements (off of epinephrine and phenylephrine) 5/21: Overnight weaned off of Giapreza, currently only on norepinephrine and vasopressin 5/22: No acute events overnight on minimal ventilator settings FiO2 30% PEEP 8.  Remains on levophed gtt to maintain map >65.  Will perform WUA  5/22: CT Head revealed No acute intracranial findings. No        change from CT 06/08/2021. 5/24: Transitioned off CRRT 5/25: Neuro exam remains poor off sedation, plan for MRI Brain.  New fever, obtain UA/TA/RUQ [place] & Doppler LE, line holiday (central line, trialysis, and A-line all removed). Empiric Zosyn started.  Neurology and ID consulted 5/26: Neuro exam slightly improving.  Fever resolved.  Plan to place new temporary HD catheter per Nephrology request.    Objective   Blood pressure (!) 149/83, pulse (!) 107, temperature (!) 100.8 F (38.2  C), resp. rate Marland Kitchen)  30, height $RemoveBe'6\' 3"'xGQaAUWCu$  (1.905 m), weight (!) 146.3 kg, SpO2 94 %.    Vent Mode: PRVC FiO2 (%):  [28 %] 28 % Set Rate:  [14 bmp] 14 bmp Vt Set:  [650 mL] 650 mL PEEP:  [5 cmH20] 5 cmH20   Intake/Output Summary (Last 24 hours) at 07/04/2021 1025 Last data filed at 07/04/2021 0600 Gross per 24 hour  Intake 809.07 ml  Output 2200 ml  Net -1390.93 ml    Filed Weights   07/03/21 0923 07/03/21 1230 07/04/21 0500  Weight: (!) 149.7 kg (!) 147.9 kg (!) 146.3 kg   Examination: General: Acutely ill-appearing obese male, laying in bed, NAD, on NO sedation, mechanically intubated  HENT: Atraumatic, normocephalic, neck supple, difficult to assess JVD due to body habitus Lungs: Coarse breath sounds throughout, even, non labored, synchronous with vent  Cardiovascular: Tachycardia, Regular rhythm,  no murmurs, rubs, gallops, 1+ generalized edema  Abdomen: +BS x4, obese, soft, non distended  Extremities: Normal bulk and tone Neuro: Off sedation, opens eyes to verbal stimulation, wiggles toes bilaterally (attempting to move UE but extremely weak), PERRL, scleredema  GU: Indwelling foley catheter draining yellow urine   Imaging     Assessment & Plan:   Acute hypoxic & hypercapnic respiratory failure in the setting of suspected aspiration in the setting of severe DTs Pulmonary Embolism - cannot r/o  -Full vent support, on lung protective strategies -Plateau pressures less than 30 cm H20 -Wean FiO2 as tolerated to maintain O2 sats >92% -Maintain PEEP  for left base atelectasis -Follow intermittent Chest X-ray & ABG as needed -Spontaneous Breathing Trials when respiratory parameters met and mental status permits -Continue VAP Bundle -PRN Bronchodilators -Venous US Bilateral LE negative for DVT x2  Septic shock~RESOLVED Elevated troponin suspect secondary to demand ischemia -Continuous cardiac monitoring -Maintain MAP 60-65 -Vasopressors as needed to maintain MAP goal -D/c  Midodrine 5/25 -HS Troponin peaked at 67 -Echocardiogram 06/23/21: LVEF 60-65%, Grade I DD, normal RV systolic function  Severe Sepsis due to Suspected aspiration~TREATED CT head 5/16 unable to exclude left otitis media and chronic bilateral maxillary sinusitis New Fever 5/25 ~ RESOLVED Temp (24hrs), Avg:99.7 F (37.6 C), Min:98.2 F (36.8 C), Max:100.8 F (38.2 C)  Lab Results  Component Value Date   WBC 9.1 07/04/2021   -Monitor fever curve -Trend WBC's & Procalcitonin -Follow cultures as above -Completed course of Unasyn  - ID following, continue empiric Zosyn for now as per ID -Line holiday given on 5/25  Hypotonic hyponatremia, suspect secondary to dehydration & poor p.o. intake~RESOLVED Acute kidney injury secondary to ATN with severe metabolic acidosis  Lactic acidosis persistent likely due to hepatic impairment Lab Results  Component Value Date   CREATININE 9.49 (H) 07/04/2021   BUN 124 (H) 07/04/2021   NA 138 07/04/2021   K 3.9 07/04/2021   CL 101 07/04/2021   CO2 19 (L) 07/04/2021   -Trend BMP, lactic acid, and ABG -Creatinine remains elevated but K and bicarb are normalized on HD -Persistent lactic acid elevation likely due to hepatic dysfunction, will not follow daily -Replace electrolytes as indicated  -Monitor UOP -Nephrology following, appreciate input: Transitioned off CRRT to intermittent HD 5/24  Transaminitis secondary to hepatic steatosis & ETOH abuse & Shock liver Lab Results  Component Value Date   AST 427 (H) 07/02/2021   AST 486 (H) 06/29/2021   AST 665 (H) 06/26/2021   -Trend LFTs, falling  Thrombocytopenia, suspect secondary to alcohol abuse Lab Results  Component Value Date  WBC 9.1 07/04/2021   HGB 12.0 (L) 07/04/2021   HCT 35.1 (L) 07/04/2021   MCV 111.1 (H) 07/04/2021   PLT 101 (L) 07/04/2021   -Trend CBC -Monitor for s/sx of bleeding and transfuse for Hgb <7 -Transfuse platelets for platelet count less than 50 with active  bleeding  Incidental finding of left adrenal nodule via CT Abd/Pelvis  -Will need abdominal MRI for further evaluation in the outpatient setting   Acute metabolic encephalopathy in the setting of severe DTs and severe hyponatremia Sedation needs in the setting of mechanical ventilation -Maintain a RASS goal of 0 to -1 but attempt to ration sedating meds  -Avoid sedating medications as able -Continue thiamine  -Continue folic acid and multivitamin -Continue lactulose and rifaximin trend ammonia level  -MRI Brain 5/25 negative -Neurology consulted, appreciate input -EEG negative for seizures -Perhaps some following of commands and purposeful movement, which is improvement   Morbid obesity with sarcopenia Protein malnutrition -Calorie excess, protein deficiency in the setting of alcoholism -Dietitian consulted for initiation of TF's  Best Practice (right click and "Reselect all SmartList Selections" daily)  Diet/type: TF's DVT prophylaxis: Subq heparin  GI prophylaxis: PPI Lines: N/A Foley:  N/A Code Status: DNR  Last date of multidisciplinary goals of care discussion [06/30/21]  Update pts wife 5/26 at bedside.  All questions answered to her satisfaction.  Labs   CBC: Recent Labs  Lab 06/30/21 0420 07/01/21 0524 07/02/21 0417 07/03/21 0445 07/04/21 0430  WBC 17.2* 14.3* 12.9* 11.0* 9.1  HGB 15.2 13.3 13.2 12.1* 12.0*  HCT 44.6 39.9 39.3 35.5* 35.1*  MCV 111.8* 111.1* 110.7* 111.6* 111.1*  PLT 85* 89* 87* 89* 101*     Basic Metabolic Panel: Recent Labs  Lab 06/30/21 0420 07/01/21 0524 07/02/21 0417 07/03/21 0445 07/04/21 0430  NA 142 142 139 141 138  K 4.3 4.8 4.4 4.3 3.9  CL 107 106 103 105 101  CO2 20* 20* 20* 18* 19*  GLUCOSE 151* 139* 121* 125* 129*  BUN 90* 117* 121* 132* 124*  CREATININE 6.04* 8.59* 8.45* 11.12* 9.49*  CALCIUM 7.9* 7.8* 7.6* 7.9* 7.8*  MG 3.2* 3.3* 2.9* 3.4* 3.0*  PHOS 5.3* 5.9* 5.7* 7.1* 6.8*    GFR: Estimated Creatinine  Clearance: 14.7 mL/min (A) (by C-G formula based on SCr of 9.49 mg/dL (H)). Recent Labs  Lab 06/27/21 2219 06/28/21 0335 07/01/21 0524 07/02/21 0417 07/03/21 0445 07/04/21 0430  WBC  --    < > 14.3* 12.9* 11.0* 9.1  LATICACIDVEN 3.0*  --   --   --   --   --    < > = values in this interval not displayed.     Liver Function Tests: Recent Labs  Lab 06/28/21 0335 06/28/21 1524 06/29/21 0330 07/02/21 1150 07/04/21 0430  AST  --   --  486* 427*  --   ALT  --   --  229* 247*  --   ALKPHOS  --   --  110 125  --   BILITOT  --   --  7.2* 4.6*  --   PROT  --   --  5.2* 5.5*  --   ALBUMIN 1.9* 2.1* 1.9*  1.9* 1.8* 2.2*    Recent Labs  Lab 07/03/21 0445  LIPASE 227*    No results for input(s): AMMONIA in the last 168 hours.   ABG    Component Value Date/Time   PHART 7.41 06/29/2021 1130   PCO2ART 32 06/29/2021 1130  PO2ART 94 06/29/2021 1130   HCO3 20.3 06/29/2021 1130   ACIDBASEDEF 3.3 (H) 06/29/2021 1130   O2SAT 98.3 06/29/2021 1130      Coagulation Profile: No results for input(s): INR, PROTIME in the last 168 hours.   Cardiac Enzymes: Recent Labs  Lab 06/30/21 1956  CKTOTAL 347     HbA1C: No results found for: HGBA1C  CBG: Recent Labs  Lab 07/03/21 1534 07/03/21 1942 07/03/21 2338 07/04/21 0421 07/04/21 0746  GLUCAP 107* 123* 138* 131* 149*     Review of Systems:   Unable to assess due to intubation/sedation/critical illness  Allergies Allergies  Allergen Reactions   Sulfa Antibiotics      Home Medications  Prior to Admission medications   Medication Sig Start Date End Date Taking? Authorizing Provider  atorvastatin (LIPITOR) 20 MG tablet Take 20 mg by mouth daily. 02/03/21  Yes [provider]  cyanocobalamin 1000 MCG tablet Take by mouth.   Yes [provider]  hydrochlorothiazide (HYDRODIURIL) 25 MG tablet Take 25 mg by mouth daily. 05/30/21  Yes [provider]  lisinopril (ZESTRIL) 40 MG tablet Take  40 mg by mouth daily. 05/04/21  Yes [provider]  rOPINIRole (REQUIP) 0.25 MG tablet Take 0.25 mg by mouth 3 (three) times daily. 07/28/20  Yes [provider]  vitamin C (ASCORBIC ACID) 500 MG tablet Take 500 mg by mouth daily.   Yes [provider]  zolpidem (AMBIEN) 10 MG tablet Take 10 mg by mouth at bedtime as needed. 06/07/21  Yes [provider]     Scheduled Meds:  artificial tears   Both Eyes Q8H   chlorhexidine gluconate (MEDLINE KIT)  15 mL Mouth Rinse BID   Chlorhexidine Gluconate Cloth  6 each Topical Q0600   feeding supplement (PROSource TF)  90 mL Per Tube QID   folic acid  1 mg Intravenous Daily   free water  30 mL Per Tube Q4H   heparin injection (subcutaneous)  5,000 Units Subcutaneous Q12H   heparin sodium (porcine)       lactulose  20 g Per Tube TID   mouth rinse  15 mL Mouth Rinse 10 times per day   multivitamin with minerals  1 tablet Per Tube Daily   pantoprazole sodium  40 mg Per Tube Daily   rifaximin  550 mg Per Tube BID   thiamine injection  100 mg Intravenous Q24H   Continuous Infusions:  sodium chloride Stopped (06/29/21 2223)   feeding supplement (VITAL AF 1.2 CAL) 60 mL/hr at 07/03/21 1800   piperacillin-tazobactam (ZOSYN)  IV 2.25 g (07/04/21 0512)   PRN Meds:.acetaminophen **OR** acetaminophen, alteplase, heparin, heparin, labetalol, lidocaine (PF), lidocaine-prilocaine, ondansetron **OR** ondansetron (ZOFRAN) IV   Critical care provider statement:   Total critical care time: 33 minutes   Performed by: Lanney Gins MD   Critical care time was exclusive of separately billable procedures and treating other patients.   Critical care was necessary to treat or prevent imminent or life-threatening deterioration.   Critical care was time spent personally by me on the following activities: development of treatment plan with patient and/or surrogate as well as nursing, discussions with consultants, evaluation of patient's  response to treatment, examination of patient, obtaining history from patient or surrogate, ordering and performing treatments and interventions, ordering and review of laboratory studies, ordering and review of radiographic studies, pulse oximetry and re-evaluation of patient's condition.    Ottie Glazier, M.D.  Pulmonary & Critical Care Medicine

## 2021-07-05 DIAGNOSIS — F10931 Alcohol use, unspecified with withdrawal delirium: Secondary | ICD-10-CM | POA: Diagnosis not present

## 2021-07-05 DIAGNOSIS — N179 Acute kidney failure, unspecified: Secondary | ICD-10-CM | POA: Diagnosis not present

## 2021-07-05 DIAGNOSIS — G9341 Metabolic encephalopathy: Secondary | ICD-10-CM | POA: Diagnosis not present

## 2021-07-05 DIAGNOSIS — R7401 Elevation of levels of liver transaminase levels: Secondary | ICD-10-CM | POA: Diagnosis not present

## 2021-07-05 LAB — HEPATIC FUNCTION PANEL
ALT: 178 U/L — ABNORMAL HIGH (ref 0–44)
AST: 233 U/L — ABNORMAL HIGH (ref 15–41)
Albumin: 2.3 g/dL — ABNORMAL LOW (ref 3.5–5.0)
Alkaline Phosphatase: 98 U/L (ref 38–126)
Bilirubin, Direct: 2.1 mg/dL — ABNORMAL HIGH (ref 0.0–0.2)
Indirect Bilirubin: 2 mg/dL — ABNORMAL HIGH (ref 0.3–0.9)
Total Bilirubin: 4.1 mg/dL — ABNORMAL HIGH (ref 0.3–1.2)
Total Protein: 6.2 g/dL — ABNORMAL LOW (ref 6.5–8.1)

## 2021-07-05 LAB — RENAL FUNCTION PANEL
Albumin: 2.1 g/dL — ABNORMAL LOW (ref 3.5–5.0)
Anion gap: 19 — ABNORMAL HIGH (ref 5–15)
BUN: 108 mg/dL — ABNORMAL HIGH (ref 6–20)
CO2: 20 mmol/L — ABNORMAL LOW (ref 22–32)
Calcium: 7.7 mg/dL — ABNORMAL LOW (ref 8.9–10.3)
Chloride: 96 mmol/L — ABNORMAL LOW (ref 98–111)
Creatinine, Ser: 8.53 mg/dL — ABNORMAL HIGH (ref 0.61–1.24)
GFR, Estimated: 7 mL/min — ABNORMAL LOW (ref >60)
Glucose, Bld: 144 mg/dL — ABNORMAL HIGH (ref 70–99)
Phosphorus: 7.7 mg/dL — ABNORMAL HIGH (ref 2.5–4.6)
Potassium: 3.4 mmol/L — ABNORMAL LOW (ref 3.5–5.1)
Sodium: 135 mmol/L (ref 135–145)

## 2021-07-05 LAB — CBC
HCT: 34 % — ABNORMAL LOW (ref 39.0–52.0)
Hemoglobin: 11.5 g/dL — ABNORMAL LOW (ref 13.0–17.0)
MCH: 38.1 pg — ABNORMAL HIGH (ref 26.0–34.0)
MCHC: 33.8 g/dL (ref 30.0–36.0)
MCV: 112.6 fL — ABNORMAL HIGH (ref 80.0–100.0)
Platelets: 123 10*3/uL — ABNORMAL LOW (ref 150–400)
RBC: 3.02 MIL/uL — ABNORMAL LOW (ref 4.22–5.81)
RDW: 18.3 % — ABNORMAL HIGH (ref 11.5–15.5)
WBC: 8.6 10*3/uL (ref 4.0–10.5)
nRBC: 0.5 % — ABNORMAL HIGH (ref 0.0–0.2)

## 2021-07-05 LAB — GLUCOSE, CAPILLARY
Glucose-Capillary: 138 mg/dL — ABNORMAL HIGH (ref 70–99)
Glucose-Capillary: 147 mg/dL — ABNORMAL HIGH (ref 70–99)
Glucose-Capillary: 121 mg/dL — ABNORMAL HIGH (ref 70–99)
Glucose-Capillary: 131 mg/dL — ABNORMAL HIGH (ref 70–99)
Glucose-Capillary: 135 mg/dL — ABNORMAL HIGH (ref 70–99)
Glucose-Capillary: 120 mg/dL — ABNORMAL HIGH (ref 70–99)

## 2021-07-05 LAB — MAGNESIUM: Magnesium: 2.6 mg/dL — ABNORMAL HIGH (ref 1.7–2.4)

## 2021-07-05 MED ORDER — ORAL CARE MOUTH RINSE
15.0000 mL | Freq: Two times a day (BID) | OROMUCOSAL | Status: DC
  Administered 2021-07-05 – 2021-07-07 (×5): 15 mL via OROMUCOSAL

## 2021-07-05 MED ORDER — HEPARIN SODIUM (PORCINE) 1000 UNIT/ML IJ SOLN
INTRAMUSCULAR | Status: AC
  Filled 2021-07-05: qty 10

## 2021-07-05 MED ORDER — ACETAMINOPHEN 10 MG/ML IV SOLN
1000.0000 mg | Freq: Once | INTRAVENOUS | Status: AC
  Administered 2021-07-05: 1000 mg via INTRAVENOUS
  Filled 2021-07-05: qty 100

## 2021-07-05 MED ORDER — POTASSIUM CHLORIDE 10 MEQ/100ML IV SOLN
10.0000 meq | Freq: Once | INTRAVENOUS | Status: AC
  Administered 2021-07-05: 10 meq via INTRAVENOUS
  Filled 2021-07-05: qty 100

## 2021-07-05 NOTE — Consult Note (Signed)
[place] for Electrolyte Monitoring and Replacement   Recent Labs: Potassium (mmol/L)  Date Value  07/05/2021 3.4 (L)   Magnesium (mg/dL)  Date Value  07/05/2021 2.6 (H)   Calcium (mg/dL)  Date Value  07/05/2021 7.7 (L)   Albumin (g/dL)  Date Value  07/05/2021 2.1 (L)   Phosphorus (mg/dL)  Date Value  07/05/2021 7.7 (H)   Sodium (mmol/L)  Date Value  07/05/2021 135   Corrected Ca 9.2 mg/dL  Assessment: Patient is a 51 y/o M with medical history including obesity, chronic back pain, EtOH use disorder who presented to the ED 5/16 with weakness in setting of abdominal cramps, nausea, vomiting, and diarrhea. Patient subsequently admitted for severe symptomatic hyponatremia. Patient decompensated on 5/18 with acute onset respiratory failure requiring intubation. Patient remains admitted to the ICU where he is intubated and on mechanical ventilation.  Admission further complicated by acute renal failure necessitating initiation of CRRT on 5/19 and subsequently transitioned to HD. Pharmacy consulted to assist with electrolyte monitoring and replacement as indicated.  Nutrition: Tube feeds initiated 5/22 Cathartics: Lactulose 20 g TID for hepatic encephalopathy  Goal of Therapy:  Electrolytes within normal limits  Plan:  --10 mEq IV KCl x 1 --Follow-up electrolytes with AM labs tomorrow  [name] 07/05/2021 7:11 AM

## 2021-07-05 NOTE — Progress Notes (Signed)
   Date of Admission:  07/03/2021     Subjective: Pt remains intubated But awake  Medications:   artificial tears   Both Eyes Q8H   chlorhexidine gluconate (MEDLINE KIT)  15 mL Mouth Rinse BID   Chlorhexidine Gluconate Cloth  6 each Topical Q0600   feeding supplement (PROSource TF)  90 mL Per Tube QID   folic acid  1 mg Intravenous Daily   free water  30 mL Per Tube Q4H   heparin injection (subcutaneous)  5,000 Units Subcutaneous Q12H   lactulose  20 g Per Tube TID   mouth rinse  15 mL Mouth Rinse q12n4p   multivitamin  1 tablet Per Tube QHS   pantoprazole sodium  40 mg Per Tube Daily   rifaximin  550 mg Per Tube BID   thiamine injection  100 mg Intravenous Q24H    Objective: Vital signs in last 24 hours BP (!) 152/77 (BP Location: Right Arm)   Pulse (!) 109   Temp (!) 100.8 F (38.2 C) (Axillary)   Resp (!) 23   Ht $R'6\' 3"'dj$  (1.905 m)   Wt (!) 146.3 kg   SpO2 97%   BMI 40.31 kg/m     PHYSICAL EXAM:  General: intubated ,awake, following commands lungs: b/l air entry NG feeds Heart: S1-S2 Abdomen: Soft, non-tender,not distended. Bowel sounds normal. No masses Extremities: atraumatic, no cyanosis. No edema. No clubbing Skin: No rashes or lesions. Or bruising Lymph: Cervical, supraclavicular normal. Neurologic: cannot be assessed  Lab Results Recent Labs    07/04/21 0430 07/05/21 0409  WBC 9.1 8.6  HGB 12.0* 11.5*  HCT 35.1* 34.0*  NA 138 135  K 3.9 3.4*  CL 101 96*  CO2 19* 20*  BUN 124* 108*  CREATININE 9.49* 8.53*   Liver Panel Recent Labs    07/04/21 0430 07/05/21 0409  ALBUMIN 2.2* 2.1*     Microbiology: Tracheal aspirate culture- candida dubliniensis    Assessment/Plan: Acute metabolic encephalopathy on presentation due to hyponatremia and alcohol withdrawal-improved  Acute hypoxia with hypercapnic resp failure- intubated  Fever - fluctuates Leucocytosis has resolved  Aspiration pneumonia on zosyn.  Has completed 7 days.  We will  discontinue   AKI/ Anuria Back on dialysis   Liver failure- multifactorial - alcohol, tylenol, shock liver- improving-- check Lfts   AUD with Dts    CT scan shows left adrenal adenoma  Discussed the management with care team

## 2021-07-05 NOTE — Progress Notes (Signed)
PT Cancellation Note  Patient Details Name: Dakwon Wenberg MRN: [id number] DOB: March 06, 1970   Cancelled Treatment:    Reason Eval/Treat Not Completed: Medical issues which prohibited therapy: Pt with L temporary femoral catheter and per protocol mobility is contraindicated at this time.  Per conversation with MD, PT orders to be completed at this time and will await new PT orders once pt is appropriate for mobility.  Linus Salmons PT, DPT 07/05/21, 8:36 AM

## 2021-07-05 NOTE — Progress Notes (Signed)
Nutrition Follow-up  DOCUMENTATION CODES:   Obesity unspecified  INTERVENTION:   Vital AF 1.2 _0 /hr continuous + Prosource TF 98m QID via tube    Free water flushes 358mq4 hours to maintain tube patency    Regimen provides 2048kcal/day, 196g/day of protein and 134815may of free water  Rena-vit daily via tube   NUTRITION DIAGNOSIS:   Inadequate oral intake related to inability to eat (pt sedated and ventilated) as evidenced by NPO status.  GOAL:   Provide needs based on ASPEN/SCCM guidelines -met   MONITOR:   Vent status, Labs, Weight trends, TF tolerance, Skin, I & O's  ASSESSMENT:   [age]o. male with past medical history including alcohol abuse, chronic back pain and peripheral neuropathy [facility] for hyponatremia, transaminitis, generalized weakness, AKI (acute kidney injury) and acute metabolic encephalopathy.  Pt remains sedated and ventilated. OGT in place. Pt tolerating tube feeds at goal rate. Pt undergoing SBTs today. Plan is for possible extubation. Pt does not want trach/PEG. Plan is for HD today. Pt with diarrhea; pt is on lactulose. Per chart, pt remains fairly weight stable since admission. Pt -6.5L on his I & Os.   Medications reviewed and include: folic acid, heparin, lactulose, rena-vit, protonix, xifaxan, thiamine   Labs reviewed: K 3.4(L), BUN 108(H), creat 8.53(H), P 7.7(H), Mg 2.6(H) Lipase- 227(H)- 5/29 Cbgs- 121, 147, 138 x 24 hrs  Patient is currently intubated on ventilator support MV: 14.3 L/min Temp (24hrs), Avg:99.6 F (37.6 C), Min:98.1 F (36.7 C), Max:100.2 F (37.9 C)  Propofol: none   MAP- >6m85m  Diet Order:   Diet Order             Diet NPO time specified  Diet effective now                  EDUCATION NEEDS:   No education needs have been identified at this time  Skin:  Skin Assessment: Skin Integrity Issues: Skin Integrity Issues:: DTI DTI: lt buttocks  Last BM:  5/31- type  7- 900ml31m rectal tube  Height:   Ht Readings from Last 1 Encounters:  07/05/21 _1  (1.905 m)    Weight:   Wt Readings from Last 1 Encounters:  07/04/21 (!) 146.3 kg    Ideal Body Weight:  89 kg  BMI:  Body mass index is 40.31 kg/m.  Estimated Nutritional Needs:   Kcal:  1510-1918kcal/day  Protein:  >178g/day  Fluid:  1000 ml + UOP  CaseyKoleen DistanceRD, LDN Please refer to [facility] HospitalRD and/or RD on-call/weekend/after hours pager

## 2021-07-05 NOTE — Progress Notes (Signed)
Patient drowsy and unable to safely complete bedside swallow eval. MD aware.  Per MD, wait until tomorrow to re-eval if patient is more awake and able to complete bedside swallow eval before placing NGT.

## 2021-07-05 NOTE — Progress Notes (Signed)
[facility]  PROGRESS NOTE   Subjective:      Patient remains intubated on vent Opens eyes with name and is able to follow simple commands Vent settings 28 FiO2 with 5 PEEP Remains off pressors Flexi-Seal remain in place. Tube feeds on hold in anticipation of extubation Generalized edema Afebrile Foley has been removed  Objective:  Vital signs: Blood pressure 137/87, pulse (!) 102, temperature 99.1 F (37.3 C), resp. rate (!) 24, height _0  (1.905 m), weight (!) 146.3 kg, SpO2 96 %.  Intake/Output Summary (Last 24 hours) at 07/05/2021 0852 Last data filed at 07/05/2021 0602 Gross per 24 hour  Intake 200 ml  Output 1900 ml  Net -1700 ml    Filed Weights   07/03/21 0923 07/03/21 1230 07/04/21 0500  Weight: (!) 149.7 kg (!) 147.9 kg (!) 146.3 kg     Physical Exam: General: Critically ill  Head:  Normocephalic, atraumatic.  Eyes: Jaundiced conjunctival edema  Lungs:  Ventilator assisted, normal effort  Heart:  S1S2 no rubs, tachy  Abdomen:  Firm, nontender,   Extremities:  3+ peripheral edema.  Neurologic: Able to follow simple commands  Skin:  No lesions  Access: Left femoral trialysis catheter placed on 7/40/8144    Basic Metabolic Panel: Recent Labs  Lab 07/01/21 0524 07/02/21 0417 07/03/21 0445 07/04/21 0430 07/05/21 0409  NA 142 139 141 138 135  K 4.8 4.4 4.3 3.9 3.4*  CL 106 103 105 101 96*  CO2 20* 20* 18* 19* 20*  GLUCOSE 139* 121* 125* 129* 144*  BUN 117* 121* 132* 124* 108*  CREATININE 8.59* 8.45* 11.12* 9.49* 8.53*  CALCIUM 7.8* 7.6* 7.9* 7.8* 7.7*  MG 3.3* 2.9* 3.4* 3.0* 2.6*  PHOS 5.9* 5.7* 7.1* 6.8* 7.7*     CBC: Recent Labs  Lab 07/01/21 0524 07/02/21 0417 07/03/21 0445 07/04/21 0430 07/05/21 0409  WBC 14.3* 12.9* 11.0* 9.1 8.6  HGB 13.3 13.2 12.1* 12.0* 11.5*  HCT 39.9 39.3 35.5* 35.1* 34.0*  MCV 111.1* 110.7* 111.6* 111.1* 112.6*  PLT 89* 87* 89* 101* 123*      Urinalysis: No results for input(s):  COLORURINE, LABSPEC, PHURINE, GLUCOSEU, HGBUR, BILIRUBINUR, KETONESUR, PROTEINUR, UROBILINOGEN, NITRITE, LEUKOCYTESUR in the last 72 hours.  Invalid input(s): APPERANCEUR     Imaging: No results found.   Medications:    sodium chloride Stopped (06/29/21 2223)   feeding supplement (VITAL AF 1.2 CAL) 1,000 mL (07/05/21 0836)   piperacillin-tazobactam (ZOSYN)  IV 2.25 g (07/05/21 0602)   potassium chloride 10 mEq (07/05/21 0827)    artificial tears   Both Eyes Q8H   chlorhexidine gluconate (MEDLINE KIT)  15 mL Mouth Rinse BID   Chlorhexidine Gluconate Cloth  6 each Topical Q0600   feeding supplement (PROSource TF)  90 mL Per Tube QID   folic acid  1 mg Intravenous Daily   free water  30 mL Per Tube Q4H   heparin injection (subcutaneous)  5,000 Units Subcutaneous Q12H   heparin sodium (porcine)       lactulose  20 g Per Tube TID   mouth rinse  15 mL Mouth Rinse 10 times per day   multivitamin  1 tablet Per Tube QHS   pantoprazole sodium  40 mg Per Tube Daily   rifaximin  550 mg Per Tube BID   thiamine injection  100 mg Intravenous Q24H    Assessment/ Plan:     Principal Problem:   Acute metabolic encephalopathy Active Problems:   Acute kidney injury (  [place])   Hyponatremia   Alcohol withdrawal ([place])   Transaminitis   51 y.o.  male with past medical history including alcohol abuse, chronic back pain, and peripheral neuropathy, who was admitted to [facility] on 06/17/2021 for Hyponatremia, Elevated liver function tests. Generalized weakness, AKI (acute kidney injury) and Acute metabolic encephalopathy.    #1: Acute kidney injury due to ETOH abuse, poor nutrition and severe illness.  Required CRRT during this admission.   Patient has now been transitioned to intermittent hemodialysis.  HD today to correct uremia.   Reassess tomorrow for need of further hemodialysis.   #2: Hypotension/shock: Remains off pressors at this time.   #3: Generalized edema-volume removal with hemodialysis as  tolerated.  Patient is currently anuric. Iv albumin during dialysis for oncotic support.  Will attempt to manage edema with dialysis, as tolerated.   #4: Acute respiratory failure with possible pneumonia: Intubated with antibiotic therapy.  ID following.  Currently getting Zosyn   #5: Transaminitis secondary to liver failure/shock liver: Most likely secondary to EtOH abuse, critical illness and hypotension.  Supportive care   LOS: [facility] 5/31/20238:52 AM

## 2021-07-05 NOTE — Procedures (Signed)
Extubation Procedure Note  Patient Details:   Name: Robert Coffey DOB: 18-Jan-1971 MRN: [id number]   Airway Documentation:    Vent end date: 07/05/21 Vent end time: 1430   Evaluation  O2 sats: stable throughout and currently acceptable Complications: No apparent complications Patient did tolerate procedure well. Bilateral Breath Sounds: Diminished   Yes  Patient was extubated to a 4L [place]. Cuff leak was heard. No stridor was noted. RN at the bedside with RT during extubation.  Tiburcio Bash 07/05/2021, 2:34 PM

## 2021-07-05 NOTE — Progress Notes (Signed)
NAME:  Robert Coffey, MRN:  [id number], DOB:  Nov 05, 1970, LOS: 31 ADMISSION DATE:  06/07/2021, CONSULTATION DATE:  06/22/2021 REFERRING MD:  Dr. Jimmye Norman, CHIEF COMPLAINT:  Acute Respiratory Distress, AMS   Brief Pt Description / Synopsis:  51 year old male admitted with acute metabolic encephalopathy secondary to alcohol withdrawal and severe hyponatremia requiring hypertonic saline.  On 5/18 developed acute hypoxic & hypercapnic respiratory failure requiring intubation and mechanical ventilation.  07/03/21- patient mentation improved, he is s/p HD today. Renal function worse overnight.   07/04/21- patient is clinically improved following verbal communication.  For HD today.  PPneumonia is improved.  Patient did not pass SBT today post dialysis.   07/05/21- patient on last day of IV zosyn. Renal function slightly improved. Plan for SBT with liberation from MV today.  Mentation is improved and lucid.   History of Present Illness:  Robert Coffey is a 51 year old male with a past medical history significant for alcohol abuse, obesity, chronic back pain, peripheral neuropathy who presented to [facility] ED on 06/13/2021 due to altered mental status, poor p.o. intake, progressive weakness with difficulty getting around at home.  Patient is now intubated and unable to contribute to history, therefore history is obtained from patient's wife at bedside and chart review.  Per the patient's wife, the patient has not been feeling well with poor p.o. intake for approximately a month, however has continued to drink alcohol daily (usually 3-4 drinks of liquor) during that time.  Over the past several days he has become progressively weak with difficulty getting around at home (not even in bed able to get out of bed), intermittent confusion, and diffuse abdominal cramping/nausea/vomiting/diarrhea.  Denies any history of falls, chest pain, shortness of breath, fever, cough.  Last known alcoholic drink was the day prior to  admission on Sunday.  Pertinent  Medical History  Alcohol abuse Obesity Chronic back  Micro Data:  5/16: SARS-CoV-2 and influenza PCR>> negative 5/16: HIV screen>> nonreactive 5/16: MRSA PCR>> negative 5/18: Tracheal aspirate>>normal respiratory flora 5/19: Blood x2>>negative 5/25: Tracheal aspirate>>rare gram - rods, rare yeast with pseudohyphae 5/25: MRSA PCR>> negative  Antimicrobials:  5/18: Unasyn>>x2 doses, restarted 5/22>>5/24 5/18: Cefepime>>5/19 5/18: Vancomycin>>5/19 5/18: Flagyl>>5/21 5/25: Zosyn>>  Significant Hospital Events: Including procedures, antibiotic start and stop dates in addition to other pertinent events   5/16: Admitted by hospitalist for acute metabolic encephalopathy in setting of severe hyponatremia and DTs.  Requiring hypertonic saline 5/18: Developed acute hypoxic respiratory failure, possible concern for aspiration.  Required intubation and mechanical ventilation. 5/19: Pt with worsening acute renal failure with severe metabolic and lactic acidosis requiring CRRT.  Severely hypotensive requiring epinephrine, levophed, and vasopressin gtts to maintain map >65  5/20: Remains critically ill, on CRRT. Vent support slightly weaned to 60% FiO2 & 10 PEEP. Some improvement in vasopressor requirements (off of epinephrine and phenylephrine) 5/21: Overnight weaned off of [place], currently only on norepinephrine and vasopressin 5/22: No acute events overnight on minimal ventilator settings FiO2 30% PEEP 8.  Remains on levophed gtt to maintain map >65.  Will perform WUA  5/22: CT Head revealed No acute intracranial findings. No        change from CT 06/19/2021. 5/24: Transitioned off CRRT 5/25: Neuro exam remains poor off sedation, plan for MRI Brain.  New fever, obtain UA/TA/RUQ [place] & Doppler LE, line holiday (central line, trialysis, and A-line all removed). Empiric Zosyn started.  Neurology and ID consulted 5/26: Neuro exam slightly improving.  Fever  resolved.  Plan to place  new temporary HD catheter per Nephrology request.    Objective   Blood pressure 137/87, pulse (!) 102, temperature 99.1 F (37.3 C), resp. rate (!) 24, height _0  (1.905 m), weight (!) 146.3 kg, SpO2 96 %.    Vent Mode: PRVC FiO2 (%):  [28 %] 28 % Set Rate:  [14 bmp] 14 bmp Vt Set:  [650 mL] 650 mL PEEP:  [5 cmH20] 5 cmH20 Pressure Support:  [5 cmH20-10 cmH20] 5 cmH20   Intake/Output Summary (Last 24 hours) at 07/05/2021 0745 Last data filed at 07/05/2021 0602 Gross per 24 hour  Intake 200 ml  Output 1900 ml  Net -1700 ml    Filed Weights   07/03/21 0923 07/03/21 1230 07/04/21 0500  Weight: (!) 149.7 kg (!) 147.9 kg (!) 146.3 kg   Examination: General: Acutely ill-appearing obese male, laying in bed, NAD, on NO sedation, mechanically intubated  HENT: Atraumatic, normocephalic, neck supple, difficult to assess JVD due to body habitus Lungs: Coarse breath sounds throughout, even, non labored, synchronous with vent  Cardiovascular: Tachycardia, Regular rhythm,  no murmurs, rubs, gallops, 1+ generalized edema  Abdomen: +BS x4, obese, soft, non distended  Extremities: Normal bulk and tone Neuro: Off sedation, opens eyes to verbal stimulation, wiggles toes bilaterally (attempting to move UE but extremely weak), PERRL, scleredema  GU: Indwelling foley catheter draining yellow urine   Imaging     Assessment & Plan:   Acute hypoxic & hypercapnic respiratory failure in the setting of suspected aspiration in the setting of severe DTs P  -Full vent support, on lung protective strategies -Plateau pressures less than 30 cm H20 -Wean FiO2 as tolerated to maintain O2 sats >92% -Maintain PEEP  for left base atelectasis -Follow intermittent Chest X-ray & ABG as needed -Spontaneous Breathing Trials when respiratory parameters met and mental status permits -Continue VAP Bundle -PRN Bronchodilators -Venous US Bilateral LE negative for DVT x2  Septic  shock~RESOLVED Elevated troponin suspect secondary to demand ischemia -Continuous cardiac monitoring -Maintain MAP 60-65 -Vasopressors as needed to maintain MAP goal -D/c Midodrine 5/25 -HS Troponin peaked at 67 -Echocardiogram 06/23/21: LVEF 60-65%, Grade I DD, normal RV systolic function  Severe Sepsis due to Suspected aspiration~TREATED CT head 5/16 unable to exclude left otitis media and chronic bilateral maxillary sinusitis New Fever 5/25 ~ RESOLVED Temp (24hrs), Avg:99.8 F (37.7 C), Min:98.1 F (36.7 C), Max:100.8 F (38.2 C)  Lab Results  Component Value Date   WBC 8.6 07/05/2021   -Monitor fever curve -Trend WBC's & Procalcitonin -Follow cultures as above -Completed course of Unasyn  - ID following, continue empiric Zosyn for now as per ID -Line holiday given on 5/25  Hypotonic hyponatremia, suspect secondary to dehydration & poor p.o. intake~RESOLVED Acute kidney injury secondary to ATN with severe metabolic acidosis  Lactic acidosis persistent likely due to hepatic impairment Lab Results  Component Value Date   CREATININE 8.53 (H) 07/05/2021   BUN 108 (H) 07/05/2021   NA 135 07/05/2021   K 3.4 (L) 07/05/2021   CL 96 (L) 07/05/2021   CO2 20 (L) 07/05/2021   -Trend BMP, lactic acid, and ABG -Creatinine remains elevated but K and bicarb are normalized on HD -Persistent lactic acid elevation likely due to hepatic dysfunction, will not follow daily -Replace electrolytes as indicated  -Monitor UOP -Nephrology following, appreciate input: Transitioned off CRRT to intermittent HD 5/24  Transaminitis secondary to hepatic steatosis & ETOH abuse & Shock liver Lab Results  Component Value Date   AST 427 (  H) 07/02/2021   AST 486 (H) 06/29/2021   AST 665 (H) 06/26/2021   -Trend LFTs, falling  Thrombocytopenia, suspect secondary to alcohol abuse Lab Results  Component Value Date   WBC 8.6 07/05/2021   HGB 11.5 (L) 07/05/2021   HCT 34.0 (L) 07/05/2021   MCV  112.6 (H) 07/05/2021   PLT 123 (L) 07/05/2021   -Trend CBC -Monitor for s/sx of bleeding and transfuse for Hgb <7 -Transfuse platelets for platelet count less than 50 with active bleeding  Incidental finding of left adrenal nodule via CT Abd/Pelvis  -Will need abdominal MRI for further evaluation in the outpatient setting   Acute metabolic encephalopathy in the setting of severe DTs and severe hyponatremia Sedation needs in the setting of mechanical ventilation -Maintain a RASS goal of 0 to -1 but attempt to ration sedating meds  -Avoid sedating medications as able -Continue thiamine  -Continue folic acid and multivitamin -Continue lactulose and rifaximin trend ammonia level  -MRI Brain 5/25 negative -Neurology consulted, appreciate input -EEG negative for seizures -Perhaps some following of commands and purposeful movement, which is improvement   Morbid obesity with sarcopenia Protein malnutrition -Calorie excess, protein deficiency in the setting of alcoholism -Dietitian consulted for initiation of TF's  Best Practice (right click and "Reselect all SmartList Selections" daily)  Diet/type: TF's DVT prophylaxis: Subq heparin  GI prophylaxis: PPI Lines: N/A Foley:  N/A Code Status: DNR  Last date of multidisciplinary goals of care discussion [06/30/21]  Update pts wife 5/26 at bedside.  All questions answered to her satisfaction.  Labs   CBC: Recent Labs  Lab 07/01/21 0524 07/02/21 0417 07/03/21 0445 07/04/21 0430 07/05/21 0409  WBC 14.3* 12.9* 11.0* 9.1 8.6  HGB 13.3 13.2 12.1* 12.0* 11.5*  HCT 39.9 39.3 35.5* 35.1* 34.0*  MCV 111.1* 110.7* 111.6* 111.1* 112.6*  PLT 89* 87* 89* 101* 123*     Basic Metabolic Panel: Recent Labs  Lab 07/01/21 0524 07/02/21 0417 07/03/21 0445 07/04/21 0430 07/05/21 0409  NA 142 139 141 138 135  K 4.8 4.4 4.3 3.9 3.4*  CL 106 103 105 101 96*  CO2 20* 20* 18* 19* 20*  GLUCOSE 139* 121* 125* 129* 144*  BUN 117* 121* 132*  124* 108*  CREATININE 8.59* 8.45* 11.12* 9.49* 8.53*  CALCIUM 7.8* 7.6* 7.9* 7.8* 7.7*  MG 3.3* 2.9* 3.4* 3.0* 2.6*  PHOS 5.9* 5.7* 7.1* 6.8* 7.7*    GFR: Estimated Creatinine Clearance: 16.4 mL/min (A) (by C-G formula based on SCr of 8.53 mg/dL (H)). Recent Labs  Lab 07/02/21 0417 07/03/21 0445 07/04/21 0430 07/05/21 0409  WBC 12.9* 11.0* 9.1 8.6     Liver Function Tests: Recent Labs  Lab 06/28/21 1524 06/29/21 0330 07/02/21 1150 07/04/21 0430 07/05/21 0409  AST  --  486* 427*  --   --   ALT  --  229* 247*  --   --   ALKPHOS  --  110 125  --   --   BILITOT  --  7.2* 4.6*  --   --   PROT  --  5.2* 5.5*  --   --   ALBUMIN 2.1* 1.9*  1.9* 1.8* 2.2* 2.1*    Recent Labs  Lab 07/03/21 0445  LIPASE 227*     No results for input(s): AMMONIA in the last 168 hours.   ABG    Component Value Date/Time   PHART 7.41 06/29/2021 1130   PCO2ART 32 06/29/2021 1130   PO2ART 94 06/29/2021 1130  HCO3 20.3 06/29/2021 1130   ACIDBASEDEF 3.3 (H) 06/29/2021 1130   O2SAT 98.3 06/29/2021 1130      Coagulation Profile: No results for input(s): INR, PROTIME in the last 168 hours.   Cardiac Enzymes: Recent Labs  Lab 06/30/21 1956  CKTOTAL 347     HbA1C: No results found for: HGBA1C  CBG: Recent Labs  Lab 07/04/21 1605 07/04/21 1929 07/04/21 2314 07/05/21 0347 07/05/21 0720  GLUCAP 142* 132* 120* 138* 147*     Review of Systems:   Unable to assess due to intubation/sedation/critical illness  Allergies Allergies  Allergen Reactions   Sulfa Antibiotics      Home Medications  Prior to Admission medications   Medication Sig Start Date End Date Taking? Authorizing Provider  atorvastatin (LIPITOR) 20 MG tablet Take 20 mg by mouth daily. 02/03/21  Yes [provider]  cyanocobalamin 1000 MCG tablet Take by mouth.   Yes [provider]  hydrochlorothiazide (HYDRODIURIL) 25 MG tablet Take 25 mg by mouth daily. 05/30/21  Yes [provider]  lisinopril (ZESTRIL) 40 MG tablet Take 40 mg by mouth daily. 05/04/21  Yes [provider]  rOPINIRole (REQUIP) 0.25 MG tablet Take 0.25 mg by mouth 3 (three) times daily. 07/28/20  Yes [provider]  vitamin C (ASCORBIC ACID) 500 MG tablet Take 500 mg by mouth daily.   Yes [provider]  zolpidem (AMBIEN) 10 MG tablet Take 10 mg by mouth at bedtime as needed. 06/07/21  Yes [provider]     Scheduled Meds:  artificial tears   Both Eyes Q8H   chlorhexidine gluconate (MEDLINE KIT)  15 mL Mouth Rinse BID   Chlorhexidine Gluconate Cloth  6 each Topical Q0600   feeding supplement (PROSource TF)  90 mL Per Tube QID   folic acid  1 mg Intravenous Daily   free water  30 mL Per Tube Q4H   heparin injection (subcutaneous)  5,000 Units Subcutaneous Q12H   lactulose  20 g Per Tube TID   mouth rinse  15 mL Mouth Rinse 10 times per day   multivitamin  1 tablet Per Tube QHS   pantoprazole sodium  40 mg Per Tube Daily   rifaximin  550 mg Per Tube BID   thiamine injection  100 mg Intravenous Q24H   Continuous Infusions:  sodium chloride Stopped (06/29/21 2223)   feeding supplement (VITAL AF 1.2 CAL) 1,000 mL (07/04/21 1348)   piperacillin-tazobactam (ZOSYN)  IV 2.25 g (07/05/21 0602)   potassium chloride     PRN Meds:.acetaminophen **OR** acetaminophen, alteplase, heparin, heparin, labetalol, lidocaine (PF), lidocaine-prilocaine, ondansetron **OR** ondansetron (ZOFRAN) IV   Critical care provider statement:   Total critical care time: 33 minutes   Performed by: Lanney Gins MD   Critical care time was exclusive of separately billable procedures and treating other patients.   Critical care was necessary to treat or prevent imminent or life-threatening deterioration.   Critical care was time spent personally by me on the following activities: development of treatment plan with patient and/or surrogate as well as nursing, discussions with  consultants, evaluation of patient's response to treatment, examination of patient, obtaining history from patient or surrogate, ordering and performing treatments and interventions, ordering and review of laboratory studies, ordering and review of radiographic studies, pulse oximetry and re-evaluation of patient's condition.    Ottie Glazier, M.D.  Pulmonary & Critical Care Medicine

## 2021-07-06 ENCOUNTER — Inpatient Hospital Stay: Payer: BC Managed Care – PPO

## 2021-07-06 LAB — BLOOD GAS, ARTERIAL
Acid-base deficit: 3.9 mmol/L — ABNORMAL HIGH (ref 0.0–2.0)
Acid-base deficit: 1 mmol/L (ref 0.0–2.0)
Bicarbonate: 20.8 mmol/L (ref 20.0–28.0)
Bicarbonate: 22.2 mmol/L (ref 20.0–28.0)
Delivery systems: POSITIVE
Expiratory PAP: 10 cmH2O
FIO2: 45 %
Inspiratory PAP: 20 cmH2O
O2 Content: 6 L/min
O2 Saturation: 99.5 %
O2 Saturation: 100 %
Patient temperature: 37
Patient temperature: 37
RATE: 12 resp/min
pCO2 arterial: 36 mmHg (ref 32–48)
pCO2 arterial: 32 mmHg (ref 32–48)
pH, Arterial: 7.37 (ref 7.35–7.45)
pH, Arterial: 7.45 (ref 7.35–7.45)
pO2, Arterial: 183 mmHg — ABNORMAL HIGH (ref 83–108)
pO2, Arterial: 148 mmHg — ABNORMAL HIGH (ref 83–108)

## 2021-07-06 LAB — CBC
HCT: 36.1 % — ABNORMAL LOW (ref 39.0–52.0)
Hemoglobin: 11.8 g/dL — ABNORMAL LOW (ref 13.0–17.0)
MCH: 37.5 pg — ABNORMAL HIGH (ref 26.0–34.0)
MCHC: 32.7 g/dL (ref 30.0–36.0)
MCV: 114.6 fL — ABNORMAL HIGH (ref 80.0–100.0)
Platelets: 150 10*3/uL (ref 150–400)
RBC: 3.15 MIL/uL — ABNORMAL LOW (ref 4.22–5.81)
RDW: 17 % — ABNORMAL HIGH (ref 11.5–15.5)
WBC: 8.9 10*3/uL (ref 4.0–10.5)
nRBC: 0.8 % — ABNORMAL HIGH (ref 0.0–0.2)

## 2021-07-06 LAB — RENAL FUNCTION PANEL
Albumin: 2.4 g/dL — ABNORMAL LOW (ref 3.5–5.0)
Anion gap: 16 — ABNORMAL HIGH (ref 5–15)
BUN: 92 mg/dL — ABNORMAL HIGH (ref 6–20)
CO2: 21 mmol/L — ABNORMAL LOW (ref 22–32)
Calcium: 7.7 mg/dL — ABNORMAL LOW (ref 8.9–10.3)
Chloride: 98 mmol/L (ref 98–111)
Creatinine, Ser: 8.11 mg/dL — ABNORMAL HIGH (ref 0.61–1.24)
GFR, Estimated: 8 mL/min — ABNORMAL LOW (ref >60)
Glucose, Bld: 119 mg/dL — ABNORMAL HIGH (ref 70–99)
Phosphorus: 8.3 mg/dL — ABNORMAL HIGH (ref 2.5–4.6)
Potassium: 4.3 mmol/L (ref 3.5–5.1)
Sodium: 135 mmol/L (ref 135–145)

## 2021-07-06 LAB — GLUCOSE, CAPILLARY
Glucose-Capillary: 116 mg/dL — ABNORMAL HIGH (ref 70–99)
Glucose-Capillary: 117 mg/dL — ABNORMAL HIGH (ref 70–99)
Glucose-Capillary: 116 mg/dL — ABNORMAL HIGH (ref 70–99)
Glucose-Capillary: 107 mg/dL — ABNORMAL HIGH (ref 70–99)
Glucose-Capillary: 102 mg/dL — ABNORMAL HIGH (ref 70–99)
Glucose-Capillary: 128 mg/dL — ABNORMAL HIGH (ref 70–99)

## 2021-07-06 LAB — MAGNESIUM: Magnesium: 2.5 mg/dL — ABNORMAL HIGH (ref 1.7–2.4)

## 2021-07-06 IMAGING — DX DG CHEST 1V PORT
1 series · 1 of 1 positions shown · non-contrast
Comparison: [DATE].

CLINICAL DATA: Pulmonary edema.

EXAM:
PORTABLE CHEST 1 VIEW

[chest ap]
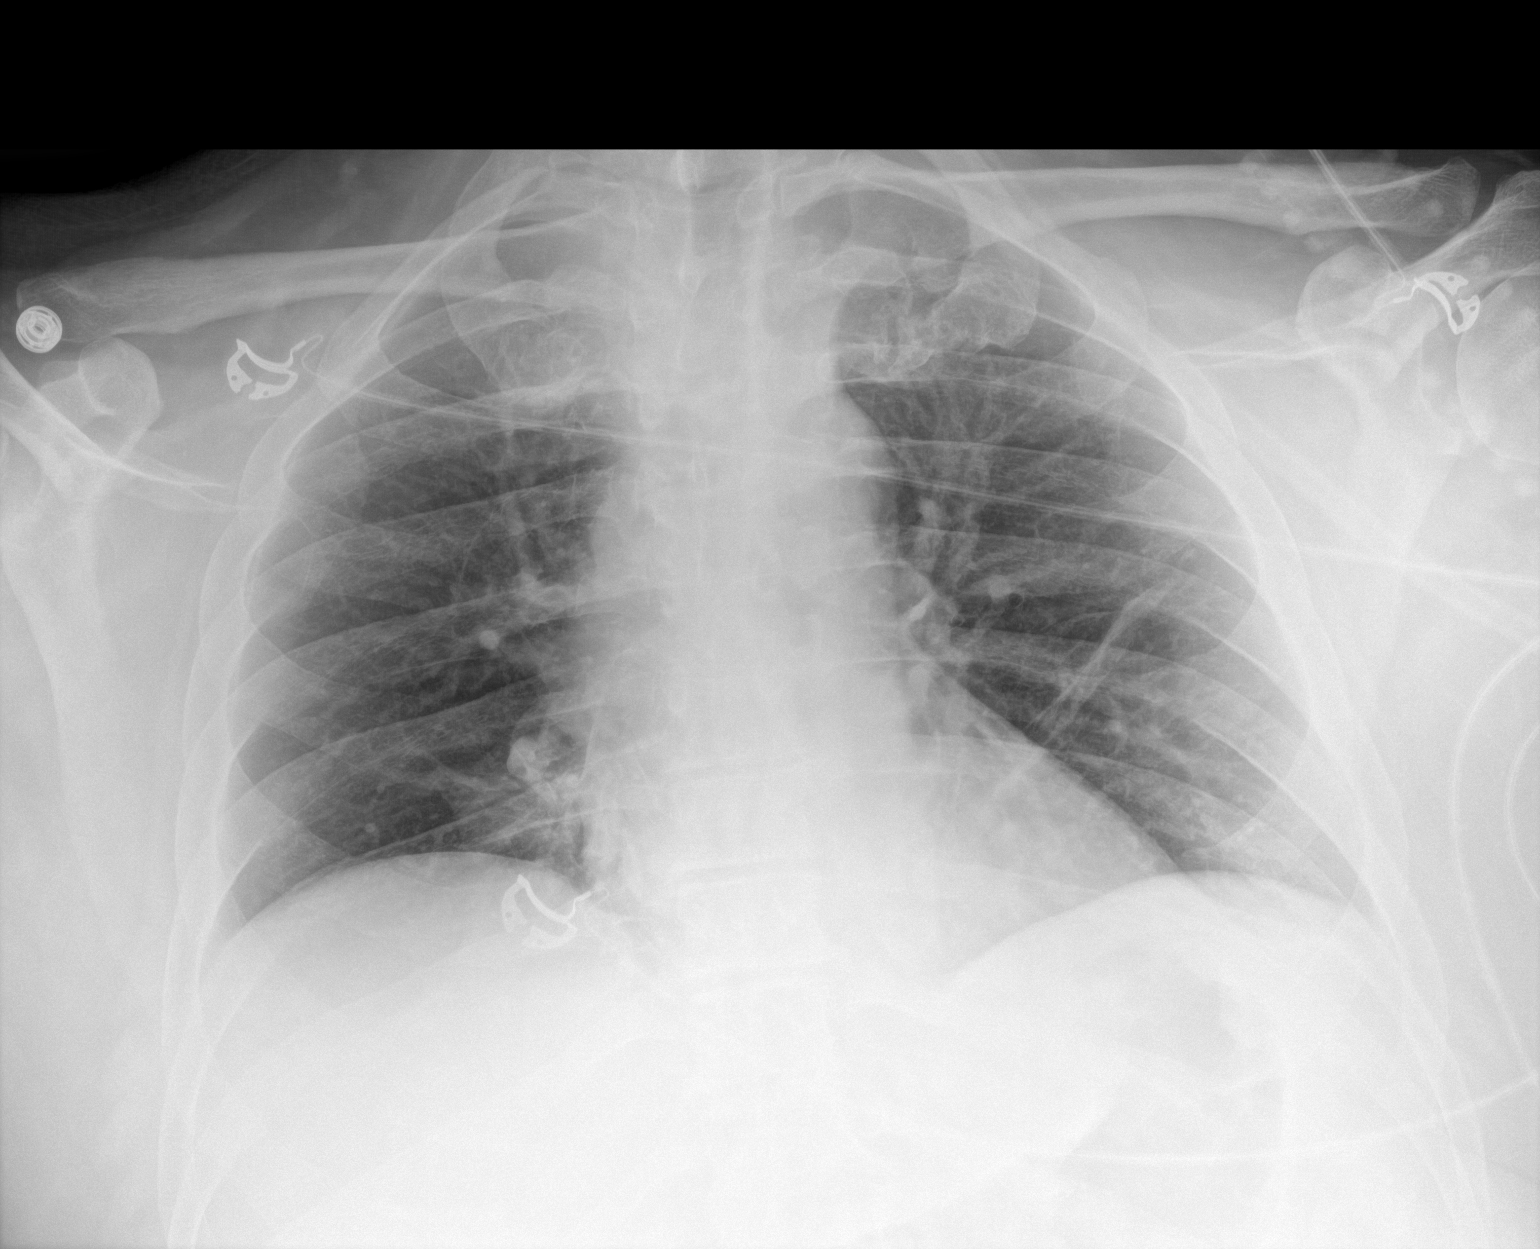

[1 of 1 positions shown; findings below may reference images not displayed]

FINDINGS: The heart size and mediastinal contours are within normal limits.
Right lung is clear. Stable left basilar subsegmental atelectasis is
noted. The visualized skeletal structures are unremarkable.
IMPRESSION: Stable left basilar subsegmental atelectasis.

## 2021-07-06 MED ORDER — ALBUTEROL SULFATE (2.5 MG/3ML) 0.083% IN NEBU
INHALATION_SOLUTION | RESPIRATORY_TRACT | Status: AC
  Administered 2021-07-06: 2.5 mg
  Filled 2021-07-06: qty 3

## 2021-07-06 MED ORDER — HEPARIN SODIUM (PORCINE) 1000 UNIT/ML IJ SOLN
INTRAMUSCULAR | Status: AC
  Administered 2021-07-06: 2800 [IU] via INTRAVENOUS_CENTRAL
  Filled 2021-07-06: qty 10

## 2021-07-06 MED ORDER — SODIUM CHLORIDE 3 % IN NEBU
4.0000 mL | INHALATION_SOLUTION | Freq: Every day | RESPIRATORY_TRACT | Status: DC
  Administered 2021-07-06 – 2021-07-07 (×2): 4 mL via RESPIRATORY_TRACT
  Filled 2021-07-06 (×2): qty 4

## 2021-07-06 MED ORDER — ALBUTEROL SULFATE (2.5 MG/3ML) 0.083% IN NEBU: 2.5000 mg | INHALATION_SOLUTION | Freq: Four times a day (QID) | RESPIRATORY_TRACT | Status: DC | PRN

## 2021-07-06 MED ORDER — FLUCONAZOLE IN SODIUM CHLORIDE 200-0.9 MG/100ML-% IV SOLN
200.0000 mg | INTRAVENOUS | Status: DC
  Administered 2021-07-06: 200 mg via INTRAVENOUS
  Filled 2021-07-06 (×2): qty 100

## 2021-07-06 MED ORDER — ACETAMINOPHEN 10 MG/ML IV SOLN
1000.0000 mg | Freq: Once | INTRAVENOUS | Status: AC
  Administered 2021-07-06: 1000 mg via INTRAVENOUS
  Filled 2021-07-06: qty 100

## 2021-07-06 MED ORDER — MORPHINE SULFATE (PF) 2 MG/ML IV SOLN
1.0000 mg | INTRAVENOUS | Status: DC | PRN
  Administered 2021-07-06 – 2021-07-07 (×2): 1 mg via INTRAVENOUS
  Filled 2021-07-06 (×2): qty 1

## 2021-07-06 NOTE — Progress Notes (Signed)
Hemodialysis Post Treatment Note:  Tx date:07/06/2021 Tx time:3 hours Access: left Femoral catheter UF Removed: 2 L  Note:  HD treatment completed. Tolerated well. No  HD complications.

## 2021-07-06 NOTE — Progress Notes (Signed)
Attempted to place NGT into left nare.  Patient informed of procedure with his spouse at bedside.  Both the patient and his spouse verbalized understanding and agreed to have the NGT placed.  At the start of insertion into his left nare, patient began thrashing his head from side-to-side and saying "No".  Patient informed of the need for NGT placement for medication administration and nutrition.  Patient stated "I don't care."  Patient informed that RN could request medication from providers to make insertion easier for him.  Patient stated, "No, I don't want it at all."  Patients' spouse at bedside attempting to convince him to have NGT placed.  MD and dietician notified.

## 2021-07-06 NOTE — Progress Notes (Signed)
NAME:  Robert Coffey, MRN:  [id number], DOB:  [birth year], LOS: 60 ADMISSION DATE:  06/22/2021, CONSULTATION DATE:  06/22/2021 REFERRING MD:  Dr. Jimmye Norman, CHIEF COMPLAINT:  Acute Respiratory Distress, AMS   Brief Pt Description / Synopsis:  51 year old male admitted with acute metabolic encephalopathy secondary to alcohol withdrawal and severe hyponatremia requiring hypertonic saline.  On 5/18 developed acute hypoxic & hypercapnic respiratory failure requiring intubation and mechanical ventilation.  07/03/21- patient mentation improved, he is s/p HD today. Renal function worse overnight.   07/04/21- patient is clinically improved following verbal communication.  For HD today.  PPneumonia is improved.  Patient did not pass SBT today post dialysis.   07/05/21- patient on last day of IV zosyn. Renal function slightly improved. Plan for SBT with liberation from MV today.  Mentation is improved and lucid.   07/06/21- patient extubated with BIPAP overnight, has had few episodes of desaturation transiently and is physically very deconditioned. Repeat ABG tonight ordered.   History of Present Illness:  Robert Coffey is a 51 year old male with a past medical history significant for alcohol abuse, obesity, chronic back pain, peripheral neuropathy who presented to [facility] ED on 06/08/2021 due to altered mental status, poor p.o. intake, progressive weakness with difficulty getting around at home.  Patient is now intubated and unable to contribute to history, therefore history is obtained from patient's wife at bedside and chart review.  Per the patient's wife, the patient has not been feeling well with poor p.o. intake for approximately a month, however has continued to drink alcohol daily (usually 3-4 drinks of liquor) during that time.  Over the past several days he has become progressively weak with difficulty getting around at home (not even in bed able to get out of bed), intermittent confusion, and diffuse  abdominal cramping/nausea/vomiting/diarrhea.  Denies any history of falls, chest pain, shortness of breath, fever, cough.  Last known alcoholic drink was the day prior to admission on Sunday.  Pertinent  Medical History  Alcohol abuse Obesity Chronic back  Micro Data:  5/16: SARS-CoV-2 and influenza PCR>> negative 5/16: HIV screen>> nonreactive 5/16: MRSA PCR>> negative 5/18: Tracheal aspirate>>normal respiratory flora 5/19: Blood x2>>negative 5/25: Tracheal aspirate>>rare gram - rods, rare yeast with pseudohyphae 5/25: MRSA PCR>> negative  Antimicrobials:  5/18: Unasyn>>x2 doses, restarted 5/22>>5/24 5/18: Cefepime>>5/19 5/18: Vancomycin>>5/19 5/18: Flagyl>>5/21 5/25: Zosyn>>  Significant Hospital Events: Including procedures, antibiotic start and stop dates in addition to other pertinent events   5/16: Admitted by hospitalist for acute metabolic encephalopathy in setting of severe hyponatremia and DTs.  Requiring hypertonic saline 5/18: Developed acute hypoxic respiratory failure, possible concern for aspiration.  Required intubation and mechanical ventilation. 5/19: Pt with worsening acute renal failure with severe metabolic and lactic acidosis requiring CRRT.  Severely hypotensive requiring epinephrine, levophed, and vasopressin gtts to maintain map >65  5/20: Remains critically ill, on CRRT. Vent support slightly weaned to 60% FiO2 & 10 PEEP. Some improvement in vasopressor requirements (off of epinephrine and phenylephrine) 5/21: Overnight weaned off of Giapreza, currently only on norepinephrine and vasopressin 5/22: No acute events overnight on minimal ventilator settings FiO2 30% PEEP 8.  Remains on levophed gtt to maintain map >65.  Will perform WUA  5/22: CT Head revealed No acute intracranial findings. No        change from CT 06/17/2021. 5/24: Transitioned off CRRT 5/25: Neuro exam remains poor off sedation, plan for MRI Brain.  New fever, obtain UA/TA/RUQ [place] & Doppler  LE, line holiday (central line, trialysis,  and A-line all removed). Empiric Zosyn started.  Neurology and ID consulted 5/26: Neuro exam slightly improving.  Fever resolved.  Plan to place new temporary HD catheter per Nephrology request.    Objective   Blood pressure 125/83, pulse (!) 109, temperature (!) 100.7 F (38.2 C), temperature source Axillary, resp. rate 20, height $RemoveBe'6\' 3"'KuhvRGphJ$  (1.905 m), weight (!) 142.4 kg, SpO2 95 %.    Vent Mode: PSV FiO2 (%):  [28 %-60 %] 45 % PEEP:  [5 cmH20] 5 cmH20 Pressure Support:  [5 cmH20] 5 cmH20   Intake/Output Summary (Last 24 hours) at 07/06/2021 0843 Last data filed at 07/05/2021 1800 Gross per 24 hour  Intake 214 ml  Output 1858 ml  Net -1644 ml    Filed Weights   07/03/21 1230 07/04/21 0500 07/06/21 0320  Weight: (!) 147.9 kg (!) 146.3 kg (!) 142.4 kg   Examination: General: Acutely ill-appearing obese male, laying in bed, NAD, on NO sedation, mechanically intubated  HENT: Atraumatic, normocephalic, neck supple, difficult to assess JVD due to body habitus Lungs: Coarse breath sounds throughout, even, non labored, synchronous with vent  Cardiovascular: Tachycardia, Regular rhythm,  no murmurs, rubs, gallops, 1+ generalized edema  Abdomen: +BS x4, obese, soft, non distended  Extremities: Normal bulk and tone Neuro: Off sedation, opens eyes to verbal stimulation, wiggles toes bilaterally (attempting to move UE but extremely weak), PERRL, scleredema  GU: Indwelling foley catheter draining yellow urine   Imaging     Assessment & Plan:   Acute hypoxic & hypercapnic respiratory failure in the setting of suspected aspiration in the setting of severe DTs P  -Full vent support, on lung protective strategies -Plateau pressures less than 30 cm H20 -Wean FiO2 as tolerated to maintain O2 sats >92% -Maintain PEEP  for left base atelectasis -Follow intermittent Chest X-ray & ABG as needed -Spontaneous Breathing Trials when respiratory parameters  met and mental status permits -Continue VAP Bundle -PRN Bronchodilators -Venous US Bilateral LE negative for DVT x2  Septic shock~RESOLVED Elevated troponin suspect secondary to demand ischemia -Continuous cardiac monitoring -Maintain MAP 60-65 -Vasopressors as needed to maintain MAP goal -D/c Midodrine 5/25 -HS Troponin peaked at 67 -Echocardiogram 06/23/21: LVEF 60-65%, Grade I DD, normal RV systolic function  Severe Sepsis due to Suspected aspiration~TREATED CT head 5/16 unable to exclude left otitis media and chronic bilateral maxillary sinusitis New Fever 5/25 ~ RESOLVED Temp (24hrs), Avg:100 F (37.8 C), Min:98.9 F (37.2 C), Max:101 F (38.3 C)  Lab Results  Component Value Date   WBC 8.9 07/06/2021   -Monitor fever curve -Trend WBC's & Procalcitonin -Follow cultures as above -Completed course of Unasyn  - ID following, continue empiric Zosyn for now as per ID -Line holiday given on 5/25  Hypotonic hyponatremia, suspect secondary to dehydration & poor p.o. intake~RESOLVED Acute kidney injury secondary to ATN with severe metabolic acidosis  Lactic acidosis persistent likely due to hepatic impairment Lab Results  Component Value Date   CREATININE 8.11 (H) 07/06/2021   BUN 92 (H) 07/06/2021   NA 135 07/06/2021   K 4.3 07/06/2021   CL 98 07/06/2021   CO2 21 (L) 07/06/2021   -Trend BMP, lactic acid, and ABG -Creatinine remains elevated but K and bicarb are normalized on HD -Persistent lactic acid elevation likely due to hepatic dysfunction, will not follow daily -Replace electrolytes as indicated  -Monitor UOP -Nephrology following, appreciate input: Transitioned off CRRT to intermittent HD 5/24  Transaminitis secondary to hepatic steatosis & ETOH abuse & Shock  liver Lab Results  Component Value Date   AST 233 (H) 07/05/2021   AST 427 (H) 07/02/2021   AST 486 (H) 06/29/2021   -Trend LFTs, falling  Thrombocytopenia, suspect secondary to alcohol abuse Lab  Results  Component Value Date   WBC 8.9 07/06/2021   HGB 11.8 (L) 07/06/2021   HCT 36.1 (L) 07/06/2021   MCV 114.6 (H) 07/06/2021   PLT 150 07/06/2021   -Trend CBC -Monitor for s/sx of bleeding and transfuse for Hgb <7 -Transfuse platelets for platelet count less than 50 with active bleeding  Incidental finding of left adrenal nodule via CT Abd/Pelvis  -Will need abdominal MRI for further evaluation in the outpatient setting   Acute metabolic encephalopathy in the setting of severe DTs and severe hyponatremia Sedation needs in the setting of mechanical ventilation -Maintain a RASS goal of 0 to -1 but attempt to ration sedating meds  -Avoid sedating medications as able -Continue thiamine  -Continue folic acid and multivitamin -Continue lactulose and rifaximin trend ammonia level  -MRI Brain 5/25 negative -Neurology consulted, appreciate input -EEG negative for seizures -Perhaps some following of commands and purposeful movement, which is improvement   Morbid obesity with sarcopenia Protein malnutrition -Calorie excess, protein deficiency in the setting of alcoholism -Dietitian consulted for initiation of TF's  Best Practice (right click and "Reselect all SmartList Selections" daily)  Diet/type: TF's DVT prophylaxis: Subq heparin  GI prophylaxis: PPI Lines: N/A Foley:  N/A Code Status: DNR  Last date of multidisciplinary goals of care discussion [06/30/21]  Update pts wife 5/26 at bedside.  All questions answered to her satisfaction.  Labs   CBC: Recent Labs  Lab 07/02/21 0417 07/03/21 0445 07/04/21 0430 07/05/21 0409 07/06/21 0312  WBC 12.9* 11.0* 9.1 8.6 8.9  HGB 13.2 12.1* 12.0* 11.5* 11.8*  HCT 39.3 35.5* 35.1* 34.0* 36.1*  MCV 110.7* 111.6* 111.1* 112.6* 114.6*  PLT 87* 89* 101* 123* 150     Basic Metabolic Panel: Recent Labs  Lab 07/02/21 0417 07/03/21 0445 07/04/21 0430 07/05/21 0409 07/06/21 0312  NA 139 141 138 135 135  K 4.4 4.3 3.9 3.4* 4.3   CL 103 105 101 96* 98  CO2 20* 18* 19* 20* 21*  GLUCOSE 121* 125* 129* 144* 119*  BUN 121* 132* 124* 108* 92*  CREATININE 8.45* 11.12* 9.49* 8.53* 8.11*  CALCIUM 7.6* 7.9* 7.8* 7.7* 7.7*  MG 2.9* 3.4* 3.0* 2.6* 2.5*  PHOS 5.7* 7.1* 6.8* 7.7* 8.3*    GFR: Estimated Creatinine Clearance: 17 mL/min (A) (by C-G formula based on SCr of 8.11 mg/dL (H)). Recent Labs  Lab 07/03/21 0445 07/04/21 0430 07/05/21 0409 07/06/21 0312  WBC 11.0* 9.1 8.6 8.9     Liver Function Tests: Recent Labs  Lab 07/02/21 1150 07/04/21 0430 07/05/21 0409 07/05/21 2127 07/06/21 0312  AST 427*  --   --  233*  --   ALT 247*  --   --  178*  --   ALKPHOS 125  --   --  98  --   BILITOT 4.6*  --   --  4.1*  --   PROT 5.5*  --   --  6.2*  --   ALBUMIN 1.8* 2.2* 2.1* 2.3* 2.4*    Recent Labs  Lab 07/03/21 0445  LIPASE 227*     No results for input(s): AMMONIA in the last 168 hours.   ABG    Component Value Date/Time   PHART 7.41 06/29/2021 1130   PCO2ART 32 06/29/2021  1130   PO2ART 94 06/29/2021 1130   HCO3 20.3 06/29/2021 1130   ACIDBASEDEF 3.3 (H) 06/29/2021 1130   O2SAT 98.3 06/29/2021 1130      Coagulation Profile: No results for input(s): INR, PROTIME in the last 168 hours.   Cardiac Enzymes: Recent Labs  Lab 06/30/21 1956  CKTOTAL 347     HbA1C: No results found for: HGBA1C  CBG: Recent Labs  Lab 07/05/21 1511 07/05/21 1917 07/05/21 2305 07/06/21 0312 07/06/21 0723  GLUCAP 131* 135* 120* 116* 117*     Review of Systems:   Unable to assess due to intubation/sedation/critical illness  Allergies Allergies  Allergen Reactions   Sulfa Antibiotics      Home Medications  Prior to Admission medications   Medication Sig Start Date End Date Taking? Authorizing Provider  atorvastatin (LIPITOR) 20 MG tablet Take 20 mg by mouth daily. 02/03/21  Yes [provider]  cyanocobalamin 1000 MCG tablet Take by mouth.   Yes [provider]   hydrochlorothiazide (HYDRODIURIL) 25 MG tablet Take 25 mg by mouth daily. 05/30/21  Yes [provider]  lisinopril (ZESTRIL) 40 MG tablet Take 40 mg by mouth daily. 05/04/21  Yes [provider]  rOPINIRole (REQUIP) 0.25 MG tablet Take 0.25 mg by mouth 3 (three) times daily. 07/28/20  Yes [provider]  vitamin C (ASCORBIC ACID) 500 MG tablet Take 500 mg by mouth daily.   Yes [provider]  zolpidem (AMBIEN) 10 MG tablet Take 10 mg by mouth at bedtime as needed. 06/07/21  Yes [provider]     Scheduled Meds:  artificial tears   Both Eyes Q8H   chlorhexidine gluconate (MEDLINE KIT)  15 mL Mouth Rinse BID   Chlorhexidine Gluconate Cloth  6 each Topical Q0600   feeding supplement (PROSource TF)  90 mL Per Tube QID   folic acid  1 mg Intravenous Daily   free water  30 mL Per Tube Q4H   heparin injection (subcutaneous)  5,000 Units Subcutaneous Q12H   lactulose  20 g Per Tube TID   mouth rinse  15 mL Mouth Rinse q12n4p   multivitamin  1 tablet Per Tube QHS   pantoprazole sodium  40 mg Per Tube Daily   rifaximin  550 mg Per Tube BID   sodium chloride HYPERTONIC  4 mL Nebulization Daily   thiamine injection  100 mg Intravenous Q24H   Continuous Infusions:  sodium chloride Stopped (06/29/21 2223)   feeding supplement (VITAL AF 1.2 CAL) Stopped (07/05/21 1030)   PRN Meds:.acetaminophen **OR** acetaminophen, albuterol, alteplase, heparin, heparin, labetalol, lidocaine (PF), lidocaine-prilocaine, ondansetron **OR** ondansetron (ZOFRAN) IV   Critical care provider statement:   Total critical care time: 33 minutes   Performed by: Lanney Gins MD   Critical care time was exclusive of separately billable procedures and treating other patients.   Critical care was necessary to treat or prevent imminent or life-threatening deterioration.   Critical care was time spent personally by me on the following activities: development of treatment plan with  patient and/or surrogate as well as nursing, discussions with consultants, evaluation of patient's response to treatment, examination of patient, obtaining history from patient or surrogate, ordering and performing treatments and interventions, ordering and review of laboratory studies, ordering and review of radiographic studies, pulse oximetry and re-evaluation of patient's condition.    Ottie Glazier, M.D.  Pulmonary & Critical Care Medicine

## 2021-07-06 NOTE — Progress Notes (Signed)
[facility]  PROGRESS NOTE   Subjective:   Patient seen sitting up in bed Alert Placed on Bipap due to increased work of breathing overnight.  No pressors Tube feeds remains held, awaiting swallow evaluation. Foley and rectal tube in place Generalized edema  Objective:  Vital signs: Blood pressure (!) 160/92, pulse (!) 109, temperature (!) 100.4 F (38 C), temperature source Axillary, resp. rate (!) 23, height 6\' 3"  (1.905 m), weight (!) 142.4 kg, SpO2 95 %.  Intake/Output Summary (Last 24 hours) at 07/06/2021 1233 Last data filed at 07/05/2021 1800 Gross per 24 hour  Intake 214 ml  Output 1858 ml  Net -1644 ml    Filed Weights   07/03/21 1230 07/04/21 0500 07/06/21 0320  Weight: (!) 147.9 kg (!) 146.3 kg (!) 142.4 kg     Physical Exam: General: Critically ill  Head:  Normocephalic, atraumatic.  Eyes: Jaundiced conjunctival edema  Lungs:  Bipap, basilar rhonchi  Heart:  S1S2 no rubs, tachy  Abdomen:  Firm, nontender  Extremities:  3+ peripheral edema.  Neurologic: Able to follow simple commands  Skin:  No lesions  Access: Left femoral trialysis catheter placed on 06/30/2021    Basic Metabolic Panel: Recent Labs  Lab 07/02/21 0417 07/03/21 0445 07/04/21 0430 07/05/21 0409 07/06/21 0312  NA 139 141 138 135 135  K 4.4 4.3 3.9 3.4* 4.3  CL 103 105 101 96* 98  CO2 20* 18* 19* 20* 21*  GLUCOSE 121* 125* 129* 144* 119*  BUN 121* 132* 124* 108* 92*  CREATININE 8.45* 11.12* 9.49* 8.53* 8.11*  CALCIUM 7.6* 7.9* 7.8* 7.7* 7.7*  MG 2.9* 3.4* 3.0* 2.6* 2.5*  PHOS 5.7* 7.1* 6.8* 7.7* 8.3*     CBC: Recent Labs  Lab 07/02/21 0417 07/03/21 0445 07/04/21 0430 07/05/21 0409 07/06/21 0312  WBC 12.9* 11.0* 9.1 8.6 8.9  HGB 13.2 12.1* 12.0* 11.5* 11.8*  HCT 39.3 35.5* 35.1* 34.0* 36.1*  MCV 110.7* 111.6* 111.1* 112.6* 114.6*  PLT 87* 89* 101* 123* 150      Urinalysis: No results for input(s): COLORURINE, LABSPEC, PHURINE, GLUCOSEU, HGBUR,  BILIRUBINUR, KETONESUR, PROTEINUR, UROBILINOGEN, NITRITE, LEUKOCYTESUR in the last 72 hours.  Invalid input(s): APPERANCEUR     Imaging: DG Chest Port 1 View  Result Date: 07/06/2021 CLINICAL DATA:  Pulmonary edema. EXAM: PORTABLE CHEST 1 VIEW COMPARISON:  Jun 30, 2021. FINDINGS: The heart size and mediastinal contours are within normal limits. Right lung is clear. Stable left basilar subsegmental atelectasis is noted. The visualized skeletal structures are unremarkable. IMPRESSION: Stable left basilar subsegmental atelectasis. Electronically Signed   By: Jul 02, 2021 M.D.   On: 07/06/2021 09:04     Medications:    sodium chloride Stopped (06/29/21 2223)   feeding supplement (VITAL AF 1.2 CAL) Stopped (07/05/21 1030)    artificial tears   Both Eyes Q8H   chlorhexidine gluconate (MEDLINE KIT)  15 mL Mouth Rinse BID   Chlorhexidine Gluconate Cloth  6 each Topical Q0600   feeding supplement (PROSource TF)  90 mL Per Tube QID   folic acid  1 mg Intravenous Daily   free water  30 mL Per Tube Q4H   heparin injection (subcutaneous)  5,000 Units Subcutaneous Q12H   heparin sodium (porcine)       lactulose  20 g Per Tube TID   mouth rinse  15 mL Mouth Rinse q12n4p   multivitamin  1 tablet Per Tube QHS   pantoprazole sodium  40 mg Per Tube Daily  rifaximin  550 mg Per Tube BID   sodium chloride HYPERTONIC  4 mL Nebulization Daily   thiamine injection  100 mg Intravenous Q24H    Assessment/ Plan:     Principal Problem:   Acute metabolic encephalopathy Active Problems:   Acute kidney injury ([place])   Hyponatremia   Alcohol withdrawal ([facility])   Transaminitis   51 y.o.  male with past medical history including alcohol abuse, chronic back pain, and peripheral neuropathy, who was admitted to [facility] on 07/03/2021 for Hyponatremia, Elevated liver function tests. Generalized weakness, AKI (acute kidney injury) and Acute metabolic encephalopathy.    #1: Acute kidney injury due to ETOH abuse,  poor nutrition and severe illness.  Required CRRT during this admission.   Patient receiving hemodialysis due to uremia and lack of urine output. Dialysis received yesterday with UF 1.5 L achieved.  Patient successfully weaned from vent yesterday however experienced respiratory distress overnight requiring BiPAP.  We will proceed with additional dialysis treatment today with UF goal 1.5 to 2 L as tolerated.  We will continue to monitor need for dialysis daily.   #2: Hypotension/shock: Remains off pressors at this time.   #3: Generalized edema-volume removal with hemodialysis as tolerated.  Patient is currently anuric. Iv albumin during dialysis for oncotic support.  Will attempt to manage edema with dialysis.   #4: Acute respiratory failure with possible pneumonia: Intubated with antibiotic therapy.  ID following.  Currently getting Zosyn   #5: Transaminitis secondary to liver failure/shock liver: Most likely secondary to EtOH abuse, critical illness and hypotension.  Supportive care   LOS: [facility] 6/1/202312:33 PM

## 2021-07-06 NOTE — Consult Note (Signed)
Robert Coffey for Electrolyte Monitoring and Replacement   Recent Labs: Potassium (mmol/L)  Date Value  07/06/2021 4.3   Magnesium (mg/dL)  Date Value  07/06/2021 2.5 (H)   Calcium (mg/dL)  Date Value  07/06/2021 7.7 (L)   Albumin (g/dL)  Date Value  07/06/2021 2.4 (L)   Phosphorus (mg/dL)  Date Value  07/06/2021 8.3 (H)   Sodium (mmol/L)  Date Value  07/06/2021 135   Corrected Ca 9.2 mg/dL  Assessment: Patient is a 51 y/o M with medical history including obesity, chronic back pain, EtOH use disorder who presented to the ED 5/16 with weakness in setting of abdominal cramps, nausea, vomiting, and diarrhea. Patient subsequently admitted for severe symptomatic hyponatremia. Patient decompensated on 5/18 with acute onset respiratory failure requiring intubation. Patient remains admitted to the ICU where he is intubated and on mechanical ventilation.  Admission further complicated by acute renal failure necessitating initiation of CRRT on 5/19 and subsequently transitioned to HD. Pharmacy consulted to assist with electrolyte monitoring and replacement as indicated.  Cathartics: Lactulose 20 g TID for hepatic encephalopathy  Goal of Therapy:  Electrolytes within normal limits  Plan:  --no electrolyte supplementation required --Follow-up electrolytes with AM labs tomorrow  [name] 07/06/2021 7:04 AM

## 2021-07-06 NOTE — Progress Notes (Signed)
Nutrition Follow-up  DOCUMENTATION CODES:   Obesity unspecified  INTERVENTION:   If NGT placed, recommend:   Osmolite 1.5$RemoveBefo'@75ml'aFyLNvMxTdw$ /hr- Initiate at 58ml/hr and increase by 39ml/hr q 8 hours until goal rate is reached.   Pro-Source 30ml BID via tube, provides 40kcal and 11g of protein per serving   Free water flushes 78ml q4 hours to maintain tube patency   Regimen provides 2780kcal/day, 135g/day protein and 1578ml/day of free water   Juven Fruit Punch BID via tube, each serving provides 95kcal and 2.5g of protein (amino acids glutamine and arginine)  Rena-vit daily via tube   NUTRITION DIAGNOSIS:   Inadequate oral intake related to inability to eat (pt sedated and ventilated) as evidenced by NPO status.  GOAL:   Patient will meet greater than or equal to 90% of their needs -met   MONITOR:   Labs, Diet advancement, Weight trends, TF tolerance, Skin, I & O's  ASSESSMENT:   51 y.o. male with past medical history including alcohol abuse, chronic back pain and peripheral neuropathy who was admitted to [facility] on 06/05/2021 for hyponatremia, transaminitis, generalized weakness, AKI (acute kidney injury) and acute metabolic encephalopathy.  Pt extubated yesterday. Pt seen by SLP today who recommend NPO. Pt lethargic today. Plan today was for NGT placement and nutrition support; notified by RN that pt is refusing tube placement. Pt continues on bipap. Plan is for HD today. Per chart, pt is down 6lbs since admission; pt - 7.9(L) on his I & Os. RD will monitor for diet advancement vs the need for nutrition support if pt decides to have tube placed.   Medications reviewed and include: folic acid, heparin, lactulose, rena-vit, protonix, xifaxan, thiamine   Labs reviewed: K 4.3 wnl, BUN 92(H), creat 8.11(H), P 8.3(H), Mg 2.5(H) Lipase- 227(H)- 5/29 Cbgs- 116, 117, 116 x 24 hrs  Diet Order:   Diet Order             Diet NPO time specified  Diet effective now                   EDUCATION NEEDS:   No education needs have been identified at this time  Skin:  Skin Assessment: Skin Integrity Issues: Skin Integrity Issues:: DTI DTI: lt buttocks  Last BM:  6/1- type 7- 323ml via rectal tube  Height:   Ht Readings from Last 1 Encounters:  07/05/21 $RemoveB'6\' 3"'KAqpARSl$  (1.905 m)    Weight:   Wt Readings from Last 1 Encounters:  07/06/21 (!) 142.4 kg    Ideal Body Weight:  89 kg  BMI:  Body mass index is 39.24 kg/m.  Estimated Nutritional Needs:   Kcal:  2700-3000kcal/day  Protein:  >135g/day  Fluid:  1000 ml + UOP  Koleen Distance MS, RD, LDN Please refer to [facility] for RD and/or RD on-call/weekend/after hours pager

## 2021-07-06 DEATH — deceased

## 2021-07-07 ENCOUNTER — Inpatient Hospital Stay: Payer: BC Managed Care – PPO

## 2021-07-07 ENCOUNTER — Encounter: Admission: EM | Disposition: E | Payer: Self-pay | Source: Home / Self Care | Attending: Internal Medicine

## 2021-07-07 DIAGNOSIS — Z66 Do not resuscitate: Secondary | ICD-10-CM

## 2021-07-07 DIAGNOSIS — N179 Acute kidney failure, unspecified: Secondary | ICD-10-CM | POA: Diagnosis not present

## 2021-07-07 DIAGNOSIS — Z7189 Other specified counseling: Secondary | ICD-10-CM | POA: Diagnosis not present

## 2021-07-07 DIAGNOSIS — R531 Weakness: Secondary | ICD-10-CM

## 2021-07-07 DIAGNOSIS — Z515 Encounter for palliative care: Secondary | ICD-10-CM | POA: Diagnosis not present

## 2021-07-07 DIAGNOSIS — G9341 Metabolic encephalopathy: Secondary | ICD-10-CM | POA: Diagnosis not present

## 2021-07-07 DIAGNOSIS — K7200 Acute and subacute hepatic failure without coma: Secondary | ICD-10-CM

## 2021-07-07 DIAGNOSIS — J9602 Acute respiratory failure with hypercapnia: Secondary | ICD-10-CM

## 2021-07-07 DIAGNOSIS — R7989 Other specified abnormal findings of blood chemistry: Secondary | ICD-10-CM | POA: Diagnosis not present

## 2021-07-07 DIAGNOSIS — E871 Hypo-osmolality and hyponatremia: Secondary | ICD-10-CM | POA: Diagnosis not present

## 2021-07-07 LAB — RENAL FUNCTION PANEL
Albumin: 2.4 g/dL — ABNORMAL LOW (ref 3.5–5.0)
Anion gap: 15 (ref 5–15)
BUN: 90 mg/dL — ABNORMAL HIGH (ref 6–20)
CO2: 21 mmol/L — ABNORMAL LOW (ref 22–32)
Calcium: 7.4 mg/dL — ABNORMAL LOW (ref 8.9–10.3)
Chloride: 100 mmol/L (ref 98–111)
Creatinine, Ser: 7.81 mg/dL — ABNORMAL HIGH (ref 0.61–1.24)
GFR, Estimated: 8 mL/min — ABNORMAL LOW (ref >60)
Glucose, Bld: 117 mg/dL — ABNORMAL HIGH (ref 70–99)
Phosphorus: 8.6 mg/dL — ABNORMAL HIGH (ref 2.5–4.6)
Potassium: 4.7 mmol/L (ref 3.5–5.1)
Sodium: 136 mmol/L (ref 135–145)

## 2021-07-07 LAB — CBC
HCT: 34.6 % — ABNORMAL LOW (ref 39.0–52.0)
HCT: 35 % — ABNORMAL LOW (ref 39.0–52.0)
Hemoglobin: 11.5 g/dL — ABNORMAL LOW (ref 13.0–17.0)
Hemoglobin: 11.3 g/dL — ABNORMAL LOW (ref 13.0–17.0)
MCH: 38.5 pg — ABNORMAL HIGH (ref 26.0–34.0)
MCH: 37.9 pg — ABNORMAL HIGH (ref 26.0–34.0)
MCHC: 33.2 g/dL (ref 30.0–36.0)
MCHC: 32.3 g/dL (ref 30.0–36.0)
MCV: 115.7 fL — ABNORMAL HIGH (ref 80.0–100.0)
MCV: 117.4 fL — ABNORMAL HIGH (ref 80.0–100.0)
Platelets: 163 10*3/uL (ref 150–400)
Platelets: 167 10*3/uL (ref 150–400)
RBC: 2.99 MIL/uL — ABNORMAL LOW (ref 4.22–5.81)
RBC: 2.98 MIL/uL — ABNORMAL LOW (ref 4.22–5.81)
RDW: 16.4 % — ABNORMAL HIGH (ref 11.5–15.5)
RDW: 15.9 % — ABNORMAL HIGH (ref 11.5–15.5)
WBC: 9.3 10*3/uL (ref 4.0–10.5)
WBC: 9.4 10*3/uL (ref 4.0–10.5)
nRBC: 0.5 % — ABNORMAL HIGH (ref 0.0–0.2)
nRBC: 0.3 % — ABNORMAL HIGH (ref 0.0–0.2)

## 2021-07-07 LAB — GLUCOSE, CAPILLARY
Glucose-Capillary: 112 mg/dL — ABNORMAL HIGH (ref 70–99)
Glucose-Capillary: 111 mg/dL — ABNORMAL HIGH (ref 70–99)
Glucose-Capillary: 112 mg/dL — ABNORMAL HIGH (ref 70–99)
Glucose-Capillary: 123 mg/dL — ABNORMAL HIGH (ref 70–99)
Glucose-Capillary: 122 mg/dL — ABNORMAL HIGH (ref 70–99)

## 2021-07-07 LAB — MAGNESIUM: Magnesium: 2.4 mg/dL (ref 1.7–2.4)

## 2021-07-07 IMAGING — CT CT ABD-PELV W/O CM
2 of 4 series · 15 of 46 positions shown, 17 images · non-contrast
Comparison: CT 2 weeks ago [DATE]

CLINICAL DATA: Intra-abdominal infection/peritonitis.



[Series 2: routine abd/pel wo · axial · 0.98mm/px · z∈[-540,+10]mm · 12 of 125 slices shown, 14 images]
[im 10/125  soft-tissue]
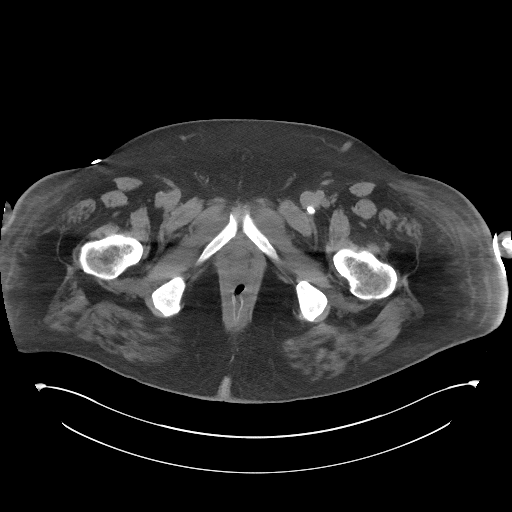
[im 10/125  bone]
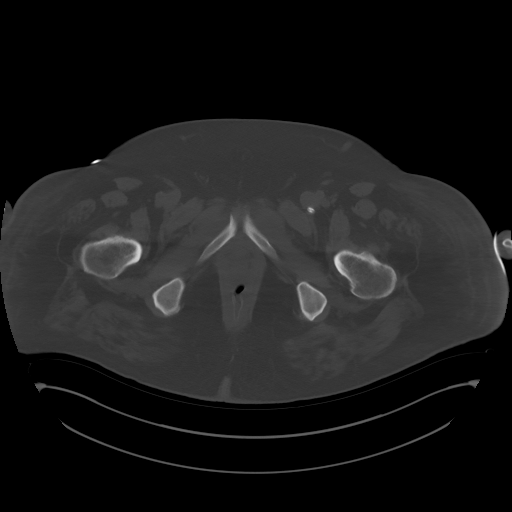
[im 20/125  soft-tissue]
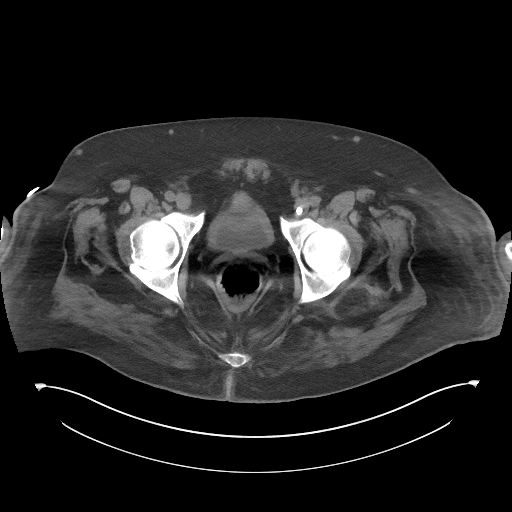
[im 30/125  soft-tissue]
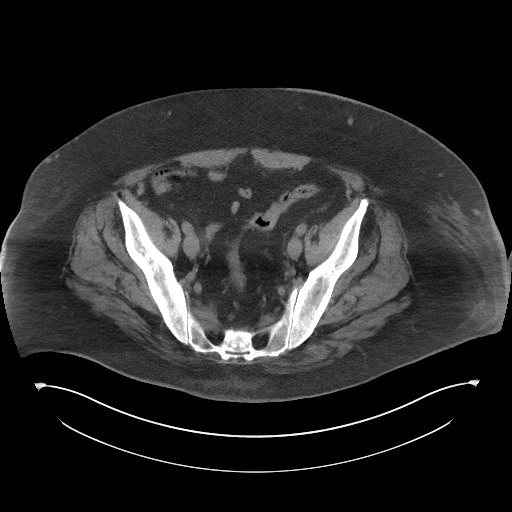
[im 40/125  soft-tissue]
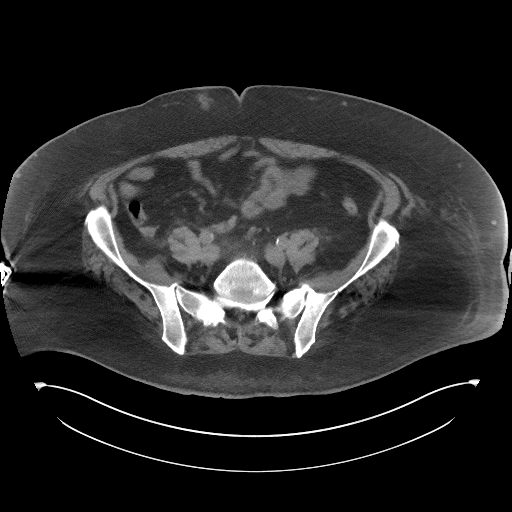
[im 50/125  soft-tissue]
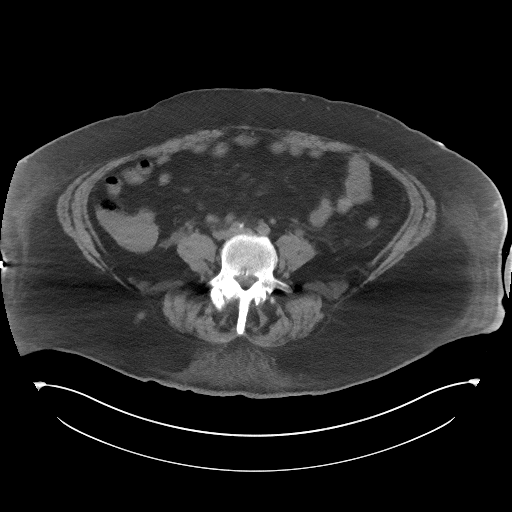
[im 60/125  soft-tissue]
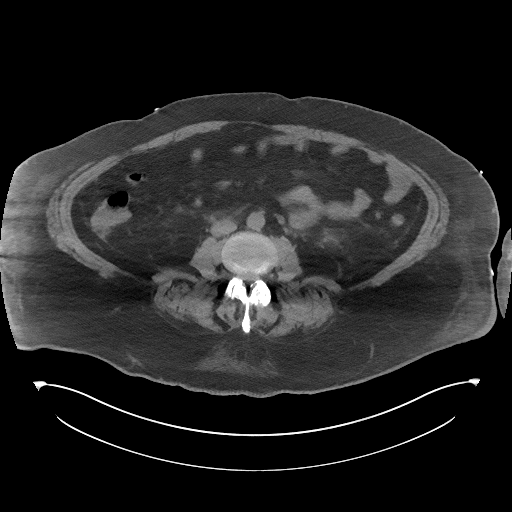
[im 70/125  soft-tissue]
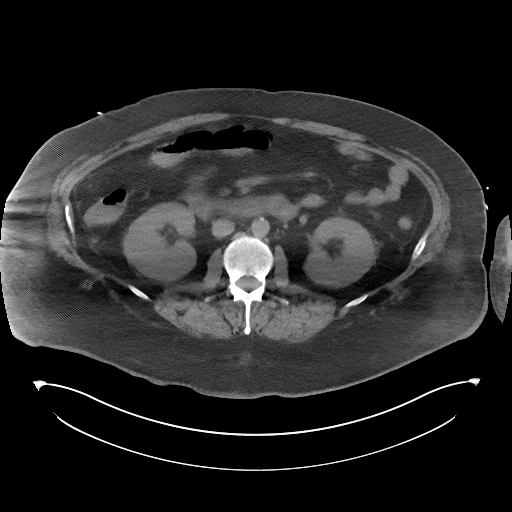
[im 80/125  soft-tissue]
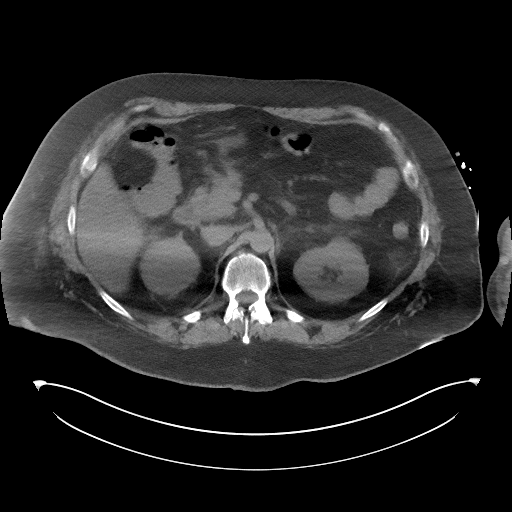
[im 90/125  soft-tissue]
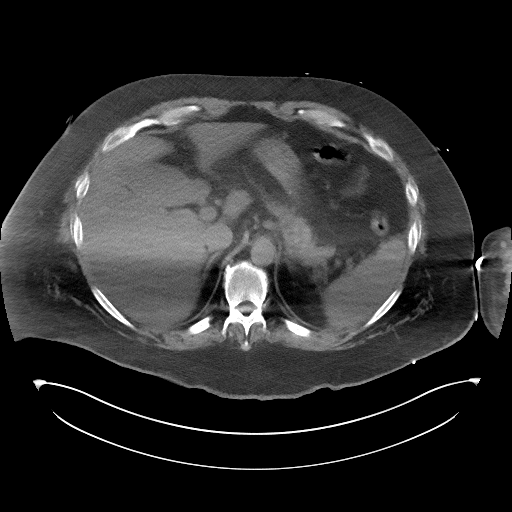
[im 90/125  bone]
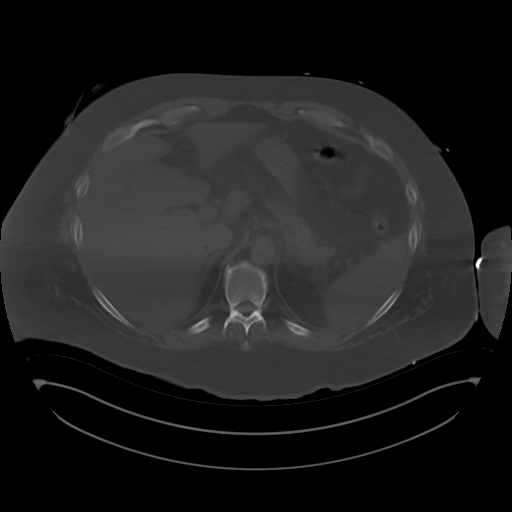
[im 100/125  soft-tissue]
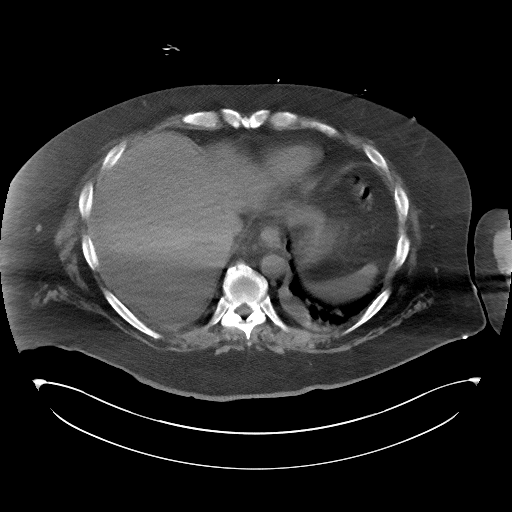
[im 110/125  soft-tissue]
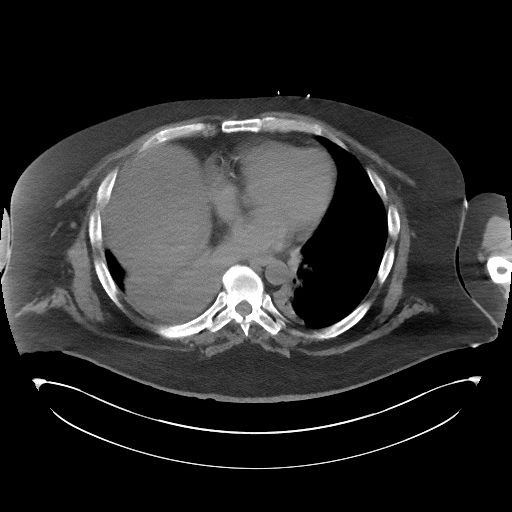
[im 120/125  soft-tissue]
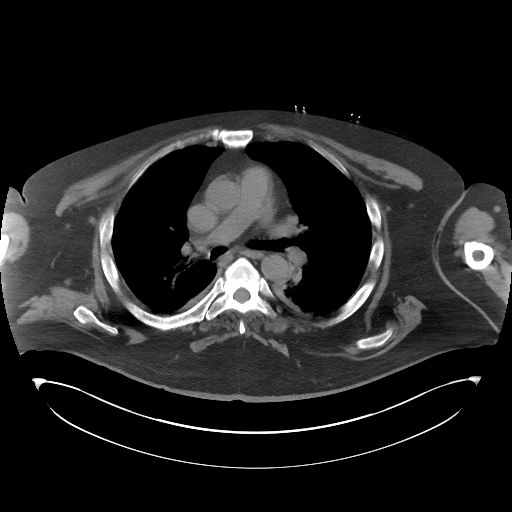

[Series 5: coronal st · coronal · 1.01mm/px · 3 of 109 slices shown]
[im 37/109  soft-tissue]
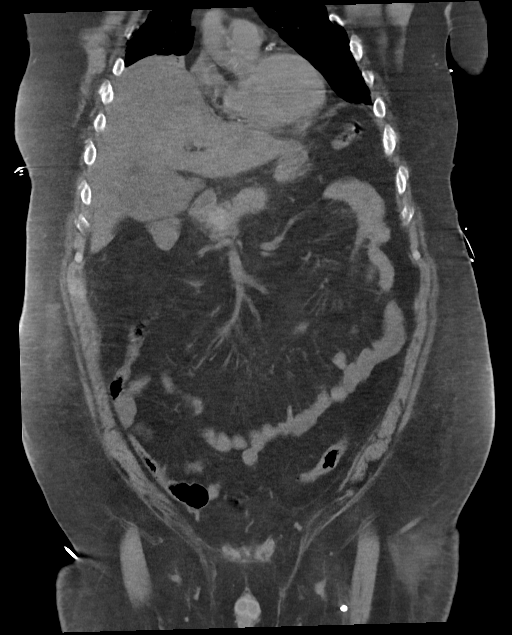
[im 49/109  soft-tissue]
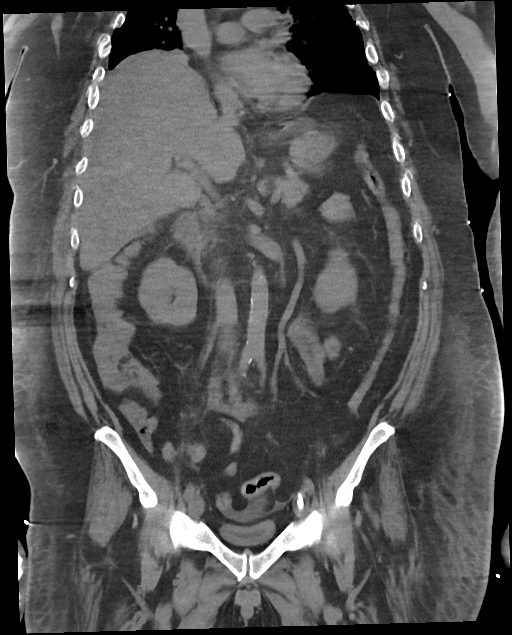
[im 61/109  soft-tissue]
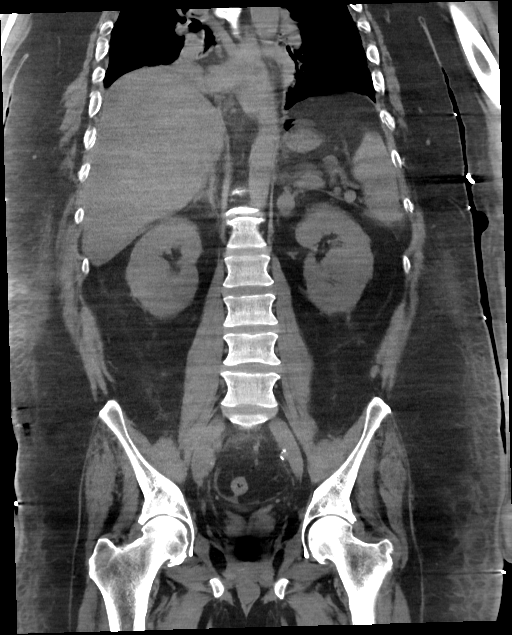

[15 of 46 positions shown; findings below may reference images not displayed]

FINDINGS: Lower chest: Elevation of right hemidiaphragm. Suspected right lower
lobe collapse, possible debris in the right lower lobe bronchus.
Medial left lung base atelectasis.

Hepatobiliary: The liver is enlarged spanning 22.5 cm cranial
caudal. Subjective hepatic steatosis. No evidence of focal liver
abnormality on this unenhanced exam, further limited by arms down
positioning leading to streak artifact. Possible layering sludge or
noncalcified stones in the gallbladder. No pericholecystic fat
stranding. No biliary dilatation.

Pancreas: Question of mild edema about the pancreas versus motion.
No pancreatic ductal dilatation.

Spleen: Grossly negative, upper spleen obscured by motion.

Adrenals/Urinary Tract: 2.6 cm left adrenal nodule with Hounsfield
units of 40. Fall right adrenal gland. No hydronephrosis. Punctate
nonobstructing stone in the lower right kidney. Faint symmetric
bilateral perinephric edema. The urinary bladder is partially
distended, thick walled.

Stomach/Bowel: Detailed bowel assessment is limited in the absence
of contrast and patient motion. There may be a small hiatal hernia.
The stomach is otherwise decompressed. No small bowel obstruction.
No evidence of small bowel inflammation. Normal appendix. Majority
of the colon is decompressed. Occasional distal descending and
sigmoid diverticulosis without focal diverticulitis. Rectal tube is
present.

Vascular/Lymphatic: Left femoral catheter tip in the external iliac
vein. Aortic atherosclerosis without aneurysm. No portal venous or
mesenteric gas. Scattered periportal lymph nodes not enlarged by
size criteria.

Reproductive: No mass.

Other: Trace ascites in the pelvis and pericolic gutters. No free
air or focal fluid collection. Subcutaneous edema in the body
wall/flank. Small fat containing left inguinal hernia, tiny fat
containing umbilical hernia.

Musculoskeletal: Lower lumbar facet hypertrophy. There are no acute
or suspicious osseous abnormalities.
IMPRESSION: 1. Question of mild edema about the pancreas versus motion, possible
mild pancreatitis. Recommend correlation with pancreatic enzymes.
2. Lung bases demonstrate suspected right lower lobe collapse,
possible debris in the right lower lobe bronchus. Recommend
correlation for aspiration risk factors.
3. Trace ascites in the pelvis and pericolic gutters. Subcutaneous
edema in the body wall/flank.
4. Punctate nonobstructing stone in the lower right kidney.
5. Hepatomegaly and hepatic steatosis.
6. Left adrenal nodule measuring 2.6 cm is indeterminate. This is
unchanged from recent exam.
Recommend adrenal washout CT or chemical shift MRI.
JACR [DATE]):[JF]-44, JCAT [JF] [REDACTED]; 40(2):194-200, Urol
J [JF] [place]; 3(2):71-4.
7. Colonic diverticulosis without diverticulitis.

Aortic Atherosclerosis ([JF]-[JF]).

## 2021-07-07 IMAGING — CT CT HEAD W/O CM
4 series · 16 of 47 positions shown, 18 images · non-contrast
Comparison: [DATE]

CLINICAL DATA: Intra-abdominal infection/peritonitis.



[Series 2: head wo · axial · 0.40mm/px · z∈[+255,+380]mm · 7 of 35 slices shown, 9 images]
[im 5/35  brain]
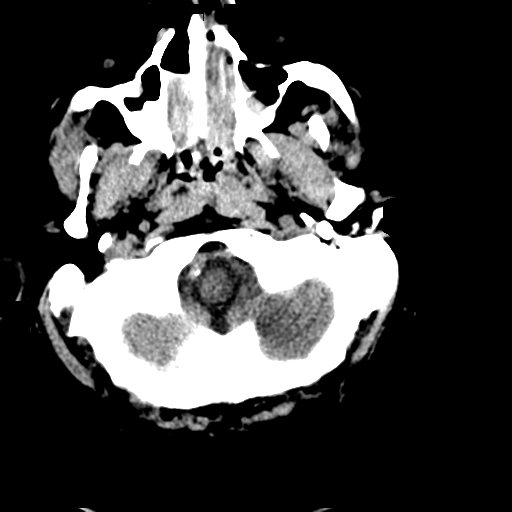
[im 5/35  bone]
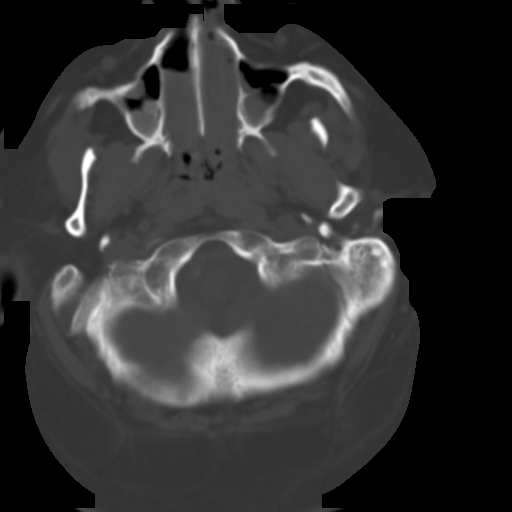
[im 9/35  brain]
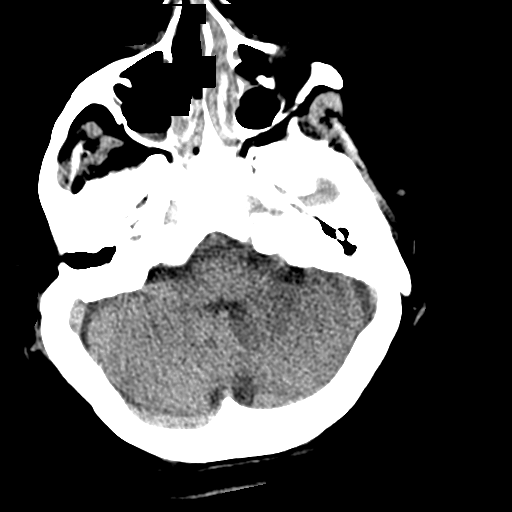
[im 13/35  brain]
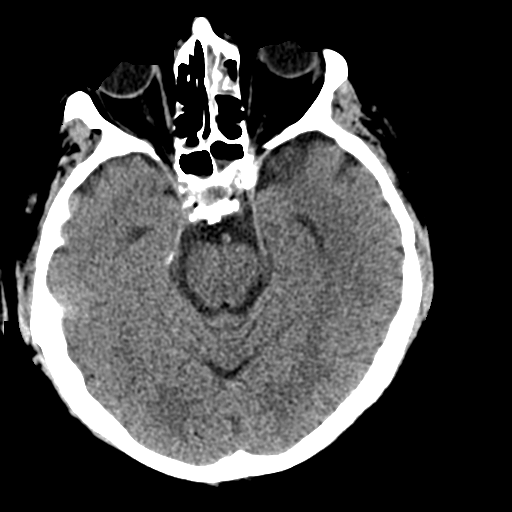
[im 18/35  brain]
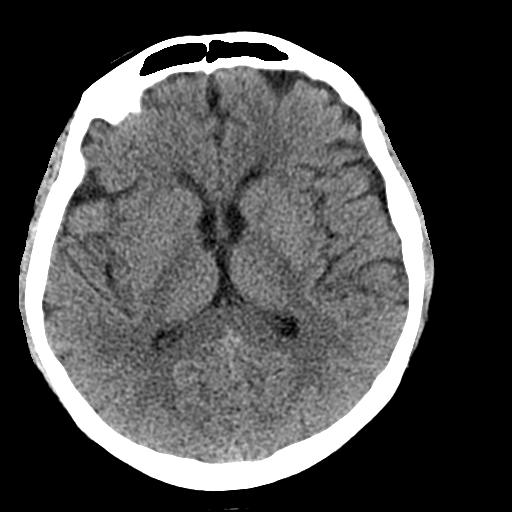
[im 22/35  brain]
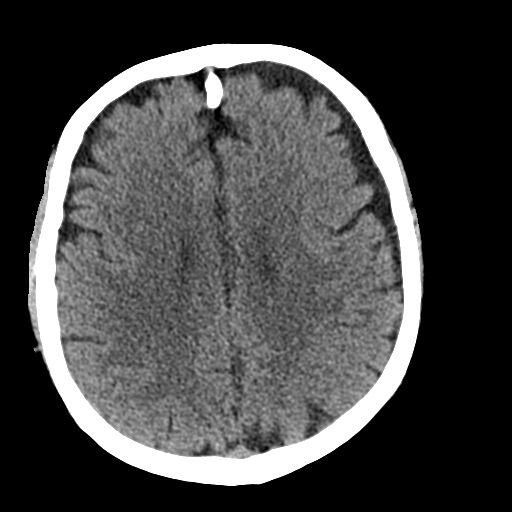
[im 22/35  bone]
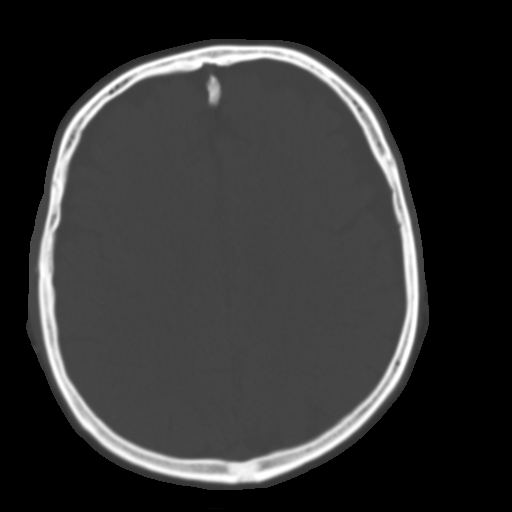
[im 26/35  brain]
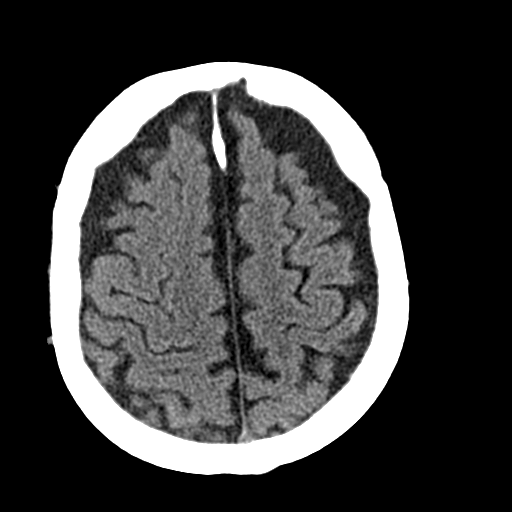
[im 30/35  brain]
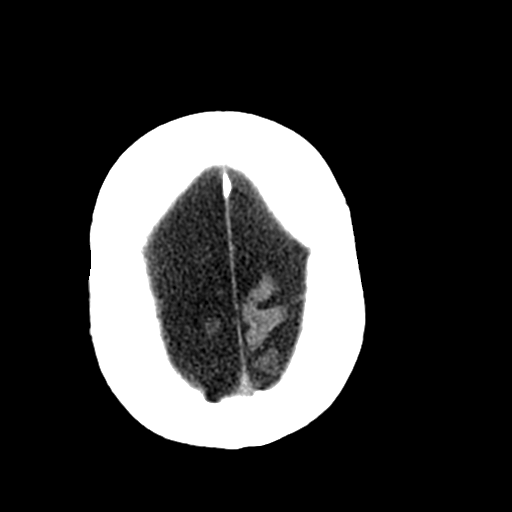

[Series 3: head bone · axial · 0.40mm/px · z∈[+251,+287]mm · 3 of 88 slices shown]
[im 9/88  bone]
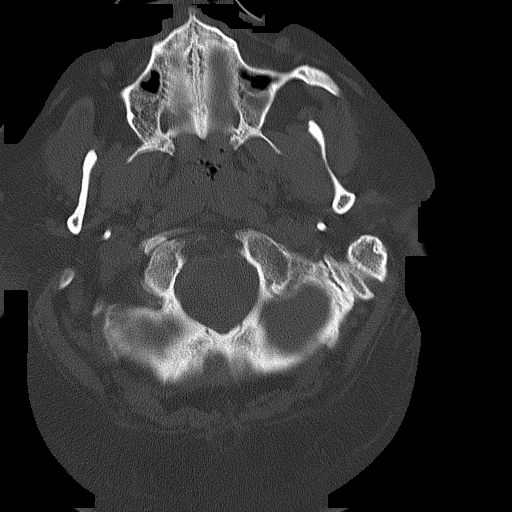
[im 18/88  bone]
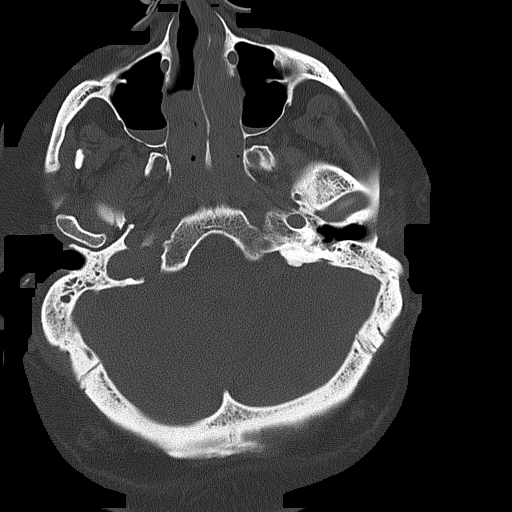
[im 27/88  bone]
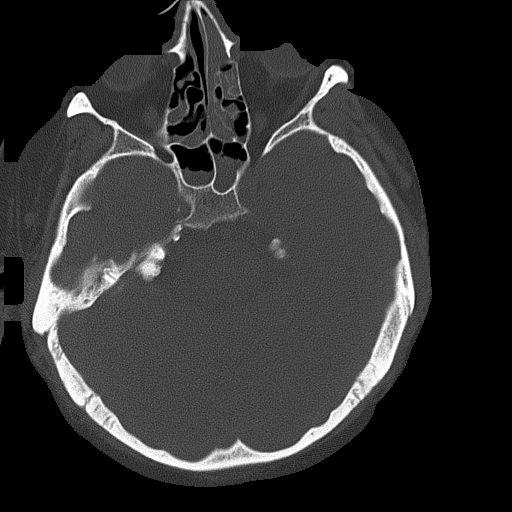

[Series 4: coronal soft tissue · coronal · 0.39mm/px · 3 of 79 slices shown]
[im 27/79  brain]
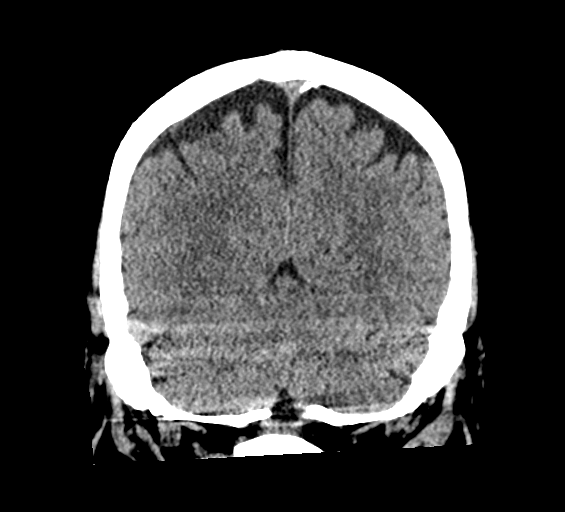
[im 35/79  brain]
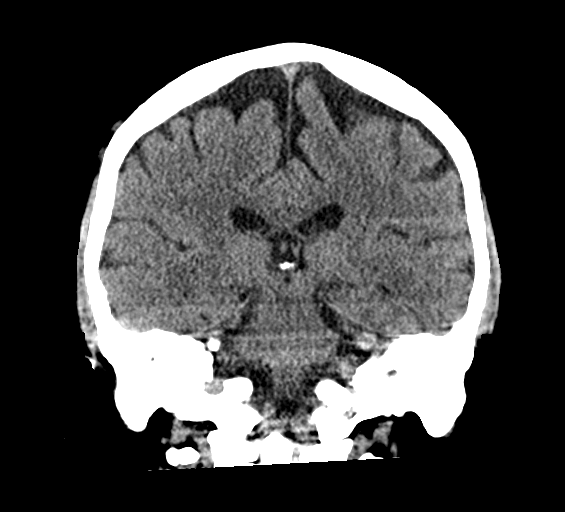
[im 44/79  brain]
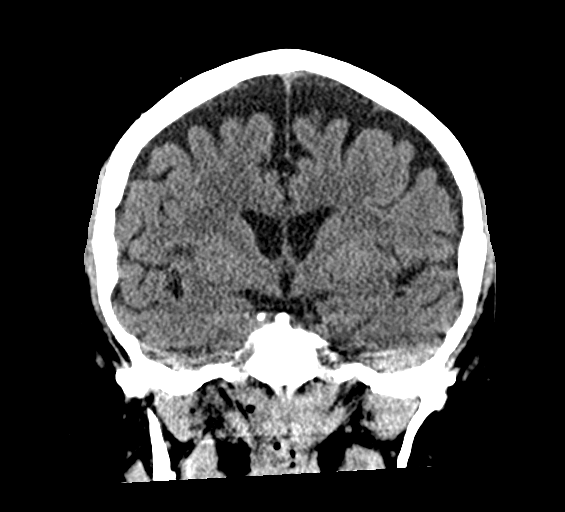

[Series 5: sagittal soft tissue · sagittal · 0.36mm/px · 3 of 68 slices shown]
[im 23/68  brain]
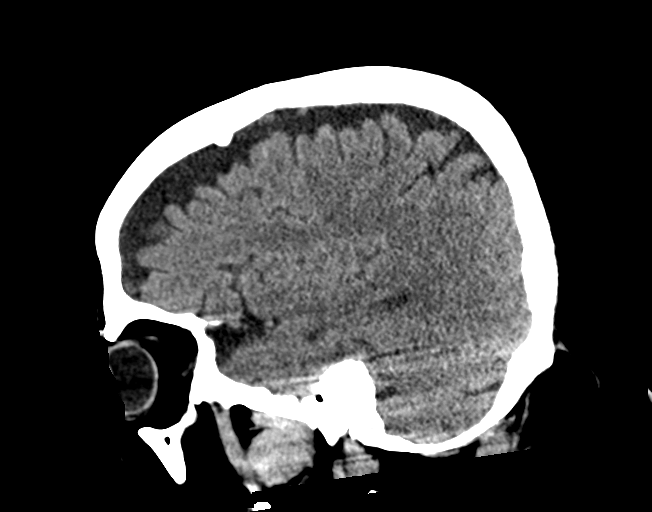
[im 34/68  brain]
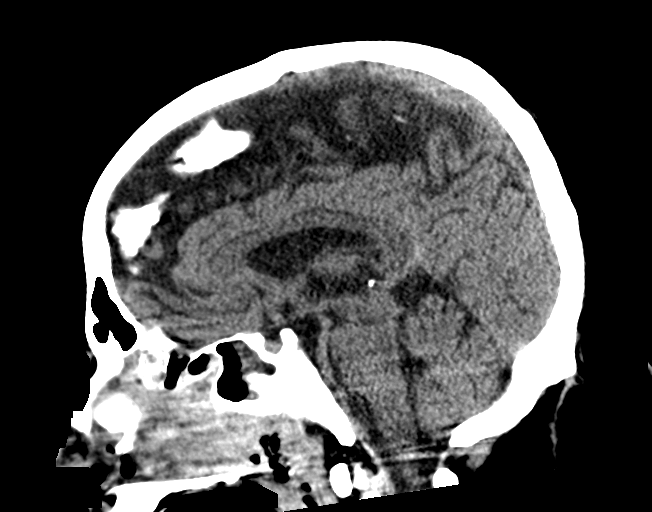
[im 45/68  brain]
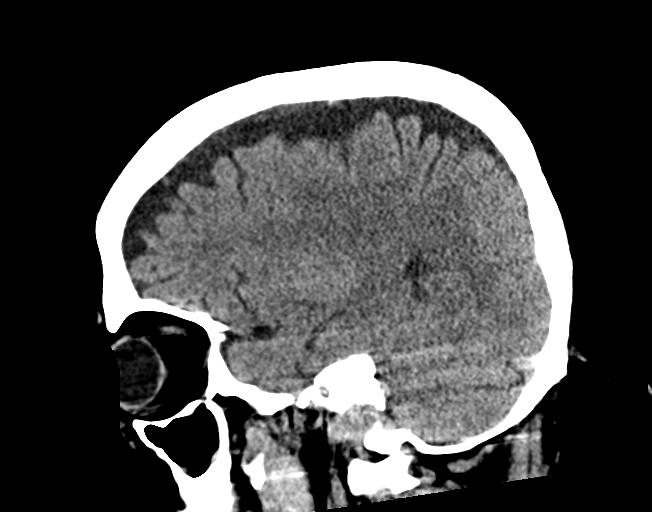

[16 of 47 positions shown; findings below may reference images not displayed]

FINDINGS: Brain: No evidence of acute infarction, hemorrhage, hydrocephalus,
extra-axial collection or mass lesion/mass effect.

Vascular: No hyperdense vessel or unexpected calcification.

Skull: Normal. Negative for fracture or focal lesion.

Sinuses/Orbits: There are small bilateral maxillary sinus air-fluid
levels. Multiple, stable bilateral maxillary sinus polyps versus
mucous retention cysts are also seen. There is moderate to marked
severity bilateral ethmoid sinus and sphenoid sinus mucosal
thickening with marked severity thickening of the nasal mucosa.

Other: None.
IMPRESSION: 1. No acute intracranial abnormality.
2. Moderate to marked severity pansinus disease.

## 2021-07-07 SURGERY — DIALYSIS/PERMA CATHETER INSERTION
Anesthesia: Moderate Sedation

## 2021-07-07 MED ORDER — HEPARIN SODIUM (PORCINE) 10000 UNIT/ML IJ SOLN
INTRAMUSCULAR | Status: AC
  Filled 2021-07-07: qty 1

## 2021-07-07 MED ORDER — VANCOMYCIN HCL 1250 MG/250ML IV SOLN
1250.0000 mg | Freq: Once | INTRAVENOUS | Status: AC
  Administered 2021-07-08: 1250 mg via INTRAVENOUS
  Filled 2021-07-07: qty 250

## 2021-07-07 MED ORDER — PANTOPRAZOLE SODIUM 40 MG IV SOLR
40.0000 mg | Freq: Every day | INTRAVENOUS | Status: DC
Start: 2021-07-07 — End: 2021-07-08
  Administered 2021-07-07: 40 mg via INTRAVENOUS
  Filled 2021-07-07: qty 10

## 2021-07-07 MED ORDER — ACETAMINOPHEN 10 MG/ML IV SOLN
1000.0000 mg | Freq: Once | INTRAVENOUS | Status: AC
  Administered 2021-07-07: 1000 mg via INTRAVENOUS
  Filled 2021-07-07: qty 100

## 2021-07-07 MED ORDER — PANTOPRAZOLE 2 MG/ML SUSPENSION: 40.0000 mg | Freq: Every day | ORAL | Status: DC

## 2021-07-07 MED ORDER — VANCOMYCIN HCL 1250 MG/250ML IV SOLN
1250.0000 mg | Freq: Once | INTRAVENOUS | Status: AC
  Administered 2021-07-07: 1250 mg via INTRAVENOUS
  Filled 2021-07-07: qty 250

## 2021-07-07 MED ORDER — SODIUM CHLORIDE 0.9 % IV SOLN
2.0000 g | INTRAVENOUS | Status: AC
Start: 2021-07-07 — End: 2021-07-07
  Administered 2021-07-07: 2 g via INTRAVENOUS
  Filled 2021-07-07: qty 12.5

## 2021-07-07 NOTE — Consult Note (Cosign Needed)
Palliative Care Consult Note                                  Date: 07/06/2021   Patient Name: Robert Coffey  DOB: Mar 11, 1970  MRN: [id number]  Age / Sex: 51 y.o., male  PCP: Erasmo Downer, NP Referring Physician: Vida Rigger, MD  Reason for Consultation: Establishing goals of care  HPI/Patient Profile: 51 y.o. male  with past medical history of alcohol abuse, obesity, chronic back pain, peripheral neuropathy who presented to [facility] ED on 07/01/2021 due to altered mental status, poor p.o. intake, progressive weakness with difficulty getting around at home. He was intubated, transferred to ICU and admitted on 06/28/2021 with acute hypoxic and hypercapnic respiratory failure secondary to suspected aspiration and severe DTs, septic shock, severe sepsis, hyponatremia, AKI due to ATN, lactic acidosis, shock liver, metabolic encephalopathy, and others.  He was intubated around the time of admission and just extubated on 07/06/2021 to BiPAP with intermittent hypoxia now stabilized.  Septic shock is resolved, sepsis was treated.  Since extubation he has had continued poor interaction and mental status likely secondary to severe illness, AKI, and others.  PMT was consulted for goals of care conversations.  History reviewed. No pertinent past medical history.  Subjective:   This NP Wynne Dust reviewed medical records, received report from team, assessed the patient and then meet at the patient's bedside to discuss diagnosis, prognosis, GOC, EOL wishes disposition and options.  I met with the patient at the bedside who is not able to meaningfully interact.  His wife Robert Coffey is his Horticulturist, commercial and was at the bedside.  I had in-depth conversations with her.   Concept of Palliative Care was introduced as specialized medical care for people and their families living with serious illness.  If focuses on providing relief from the symptoms and stress of  a serious illness.  The goal is to improve quality of life for both the patient and the family. Values and goals of care important to patient and family were attempted to be elicited.  Created space and opportunity for patient  and family to explore thoughts and feelings regarding current medical situation   Natural trajectory and current clinical status were discussed. Questions and concerns addressed. Patient  encouraged to call with questions or concerns.    Patient/Family Understanding of Illness: She understands 2 weeks ago he was intubated for 2 weeks and has since been extubated.  She understands he is weak and deconditioned.  He requires hemodialysis but he has a family member that limits his ability to finally work with PT to attempt strengthening (To help as possible permacath placement to allow removal of the femoral line.  Mentally she states she is "totally out of it."  We had further discussion on this acute illnesses, and how those may affect his life.  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 He worked for the [employer] for 28-29 years. After retirement he planned to travel around the [place] and [place] to train other police officers. He and his wife have been married for 22 years. They have 3 children: 49 years old, 62 years old, and 49 years old. Robert Coffey feels they are coping well with all of this. He loves going to concerts (especially Tommi Rumps), he is active with the [facility] and enjoys sporting events (especially the [place]).  Patient Values:  Family, independence  Goals: To see how well he can get, go to rehab pending evaluation, attempt to get back to his life (though she understands this will likely take some time).  Today's Discussion: In addition to the conversations above, we had further extensive conversations on multiple topics.  His wife is quite tearful during our conversations.  She states that she has a good support system  and has been on them.  It seems that she is supporting the patient, supporting herself, and trying to support her children throughout this difficult situation.  We discussed more pleasant topics such as their love for animals.  They have 2 dogs, 2 cats, and 1 bird.  She seemed to take joy in speaking about her pets.  We discussed that there are likely multiple pathways that this could proceed.  At this point we cannot determine what amount of cognitive function or physical functioning he will have.  We discussed full and aggressive care including escalation, continue current care without escalation, versus comfort care.  At this point she would like to proceed with continued aggressive care to allow for permacath placement, continue dialysis in order to see how he does.  I discussed that because he was just extubated a day ago this is still actually quite early on.  We discussed that there is hope that he will slowly become more mentally aware, be eligible for rehab, regain strength through nutrition and therapy, and (over likely long period of time) get back to home into somewhat of a quality life.  However, she does share her big fears that he will end up in a vegetative state.  She knows that he would not want this.  I discussed that if we are feeling like it is getting to that direction and we can have further conversations.  I shared that palliative medicine will continue to support her through decision-making and through this journey of acute illness and life change.  I provided contact information as well as a copy of a resource book "difficult choices for loving people" to help her in thinking through possibilities.  Provided emotional and general support through therapeutic listening, empathy, therapeutic touch, sharing stories, and other techniques.  Answered all questions and addressed all concerns to the best of my ability.  Review of Systems  Unable to perform ROS: Mental status change    Objective:   Primary Diagnoses: Present on Admission:  Acute metabolic encephalopathy  Acute kidney injury ([place])  Hyponatremia  Alcohol withdrawal ([place])  Transaminitis   Physical Exam Vitals and nursing note reviewed.  Constitutional:      General: He is not in acute distress.    Appearance: He is obese. He is ill-appearing.  HENT:     Head: Normocephalic and atraumatic.  Cardiovascular:     Rate and Rhythm: Normal rate.  Pulmonary:     Effort: Pulmonary effort is normal. No respiratory distress.     Breath sounds: Rhonchi present.  Abdominal:     General: Abdomen is flat.     Palpations: Abdomen is soft.  Skin:    General: Skin is warm and dry.  Neurological:     Mental Status: He is lethargic.    Vital Signs:  BP (!) 157/98   Pulse (!) 110   Temp (!) 100.6 F (38.1 C) (Axillary)   Resp (!) 23   Ht $R'6\' 3"'tP$  (1.905 m)   Wt (!) 139.4 kg   SpO2 96%   BMI 38.41 kg/m   Palliative  Assessment/Data: 10% (not eating, NGT ordered but thus far refused)    Advanced Care Planning:   Primary Decision Maker: NEXT OF KIN  Code Status/Advance Care Planning: DNR  A discussion was had today regarding advanced directives. Concepts specific to code status, artifical feeding and hydration, continued IV antibiotics and rehospitalization was had.  The difference between a aggressive medical intervention path and a palliative comfort care path for this patient at this time was had.   Decisions/Changes to ACP: None today  Assessment & Plan:   Impression: 51 year old male who presented with altered mental status due to EtOH withdrawal and subsequent aspiration pneumonia, septic shock, respiratory failure requiring prolonged intubation, AKI due to tubular necrosis currently on intermittent hemodialysis.  His wife is his next of kin and understands how very ill he is.  He is currently not very mentally aware with poor cognitive interaction.  She understands there is a long road  ahead.  She shared he would not want to be in a vegetative state, if it comes to that.  Otherwise, continue aggressive care including permacath placement.  We need more time for now that he is off the ventilator to follow his clinical progress, allow time for outcomes, continued support and decision-making.  SUMMARY OF RECOMMENDATIONS   Remain DNR (confirmed with wife today) Proceed with current care and escalation Agreeable to permacath placement to allow femoral line to be DC'd He is hopeful that PT and OT can begin working with him after permacath placement Allow time for outcomes Continue goals of care discussions based on evolution of clinical status PMT will continue to follow  Symptom Management:  Per primary team PMT is available to assist as needed  Prognosis:  Unable to determine  Discharge Planning:  To Be Determined   Discussed with: Patient, family, medical team, nursing team    Thank you for allowing [place] to participate in the care of Robert Coffey PMT will continue to support holistically.  Time Total: 75 min  Greater than 50%  of this time was spent counseling and coordinating care related to the above assessment and plan.  Signed by: Walden Field, NP Palliative Medicine Team  Team Phone # [phone] (Nights/Weekends)  07/09/2021, 11:01 AM

## 2021-07-07 NOTE — Progress Notes (Signed)
ID Pt seen with Dr.Aleskerov He is having respiratory distress due to hemoptysis Pulse ox in 70s Is being suctioned large blood clots  Febrile BP (!) 146/92   Pulse (!) 113   Temp (!) 102.2 F (39 C) (Axillary)   Resp (!) 22   Ht 6\' 3"  (1.905 m)   Wt (!) 139.4 kg   SpO2 92%   BMI 38.41 kg/m    He is on NRB Non verbal Left femoral vein HD cath Loose watery stool of 1 Liter was on lactulose which has been stopped today  Labs    Latest Ref Rng & Units 07/18/2021    4:43 AM 07/06/2021    3:12 AM 07/05/2021    4:09 AM  CBC  WBC 4.0 - 10.5 K/uL 9.3   8.9   8.6    Hemoglobin 13.0 - 17.0 g/dL 11.5   11.8   11.5    Hematocrit 39.0 - 52.0 % 34.6   36.1   34.0    Platelets 150 - 400 K/uL 163   150   123          Latest Ref Rng & Units 07/13/2021    4:43 AM 07/06/2021    3:12 AM 07/05/2021    9:27 PM  CMP  Glucose 70 - 99 mg/dL 117   119     BUN 6 - 20 mg/dL 90   92     Creatinine 0.61 - 1.24 mg/dL 7.81   8.11     Sodium 135 - 145 mmol/L 136   135     Potassium 3.5 - 5.1 mmol/L 4.7   4.3     Chloride 98 - 111 mmol/L 100   98     CO2 22 - 32 mmol/L 21   21     Calcium 8.9 - 10.3 mg/dL 7.4   7.7     Total Protein 6.5 - 8.1 g/dL   6.2    Total Bilirubin 0.3 - 1.2 mg/dL   4.1    Alkaline Phos 38 - 126 U/L   98    AST 15 - 41 U/L   233    ALT 0 - 44 U/L   178      Microbiology: Tracheal aspirate culture- candida dubliniensis   Pression/recommendation  Acute hypoxic respiratory failure secondary to hemoptysis.  Status post suction and is on nonrebreather  Acute metabolic encephalopathy on presentation due to severe hyponatremia and alcohol withdrawal  Fever intermittent.  Today it has been Tmax of 102.2.  Normal WBC Unclear etiology was initially treated as aspiration pneumonia and antibiotics were stopped on 07/05/2021 after nearly 10 days All lines were changed . Cortisol level normal ??  Central fever If the fever persists will need to send blood cultures and restart her  on IV antibiotics including vancomycin and cefepime.  AKI on dialysis  Diarrhea due to lactulose and NG feeds Both have been stopped observe closely.  As he has normal leukocytes doubt it is C. difficile  Failure resolving Multifactorial secondary to shock liver, alcohol, Tylenol  Discussed the management with the care team.  ID will follow him peripherally this weekend.

## 2021-07-07 NOTE — Progress Notes (Signed)
CRITICAL CARE   -patient had medical set back with episode of hemoptysis and hypoxemia. He is weak and unable to protect airway.  He required numerous therapeutic aspirations returning large clotted debris from hypopharynx with dark phlegm. He is at high risk of death. I have called his wife Robert Coffey and communicated most recent events. Patient remains DNR.    Ottie Glazier, M.D.  Pulmonary & [place]

## 2021-07-07 NOTE — Progress Notes (Signed)
NAME:  Robert Coffey, MRN:  [id number], DOB:  [birth year], LOS: 36 ADMISSION DATE:  06/26/2021, CONSULTATION DATE:  06/22/2021 REFERRING MD:  Dr. Jimmye Norman, CHIEF COMPLAINT:  Acute Respiratory Distress, AMS   Brief Pt Description / Synopsis:  51 year old male admitted with acute metabolic encephalopathy secondary to alcohol withdrawal and severe hyponatremia requiring hypertonic saline.  On 5/18 developed acute hypoxic & hypercapnic respiratory failure requiring intubation and mechanical ventilation.  07/03/21- patient mentation improved, he is s/p HD today. Renal function worse overnight.   07/04/21- patient is clinically improved following verbal communication.  For HD today.  PPneumonia is improved.  Patient did not pass SBT today post dialysis.   07/05/21- patient on last day of IV zosyn. Renal function slightly improved. Plan for SBT with liberation from MV today.  Mentation is improved and lucid.   07/06/21- patient extubated with BIPAP overnight, has had few episodes of desaturation transiently and is physically very deconditioned. Repeat ABG tonight ordered.  08/01/2021- patient refused NG tube, he still requires RRT and vascular consult placed for per CATH placement. PT/OT in process post removal of fem Trialysis. Palliative consult for goals of care since patient declines most of the care offered to him.    History of Present Illness:  Robert Coffey is a 51 year old male with a past medical history significant for alcohol abuse, obesity, chronic back pain, peripheral neuropathy who presented to [facility] ED on 06/11/2021 due to altered mental status, poor p.o. intake, progressive weakness with difficulty getting around at home.  Patient is now intubated and unable to contribute to history, therefore history is obtained from patient's wife at bedside and chart review.  Per the patient's wife, the patient has not been feeling well with poor p.o. intake for approximately a month, however has continued to  drink alcohol daily (usually 3-4 drinks of liquor) during that time.  Over the past several days he has become progressively weak with difficulty getting around at home (not even in bed able to get out of bed), intermittent confusion, and diffuse abdominal cramping/nausea/vomiting/diarrhea.  Denies any history of falls, chest pain, shortness of breath, fever, cough.  Last known alcoholic drink was the day prior to admission on Sunday.  Pertinent  Medical History  Alcohol abuse Obesity Chronic back  Micro Data:  5/16: SARS-CoV-2 and influenza PCR>> negative 5/16: HIV screen>> nonreactive 5/16: MRSA PCR>> negative 5/18: Tracheal aspirate>>normal respiratory flora 5/19: Blood x2>>negative 5/25: Tracheal aspirate>>rare gram - rods, rare yeast with pseudohyphae 5/25: MRSA PCR>> negative  Antimicrobials:  5/18: Unasyn>>x2 doses, restarted 5/22>>5/24 5/18: Cefepime>>5/19 5/18: Vancomycin>>5/19 5/18: Flagyl>>5/21 5/25: Zosyn>>  Significant Hospital Events: Including procedures, antibiotic start and stop dates in addition to other pertinent events   5/16: Admitted by hospitalist for acute metabolic encephalopathy in setting of severe hyponatremia and DTs.  Requiring hypertonic saline 5/18: Developed acute hypoxic respiratory failure, possible concern for aspiration.  Required intubation and mechanical ventilation. 5/19: Pt with worsening acute renal failure with severe metabolic and lactic acidosis requiring CRRT.  Severely hypotensive requiring epinephrine, levophed, and vasopressin gtts to maintain map >65  5/20: Remains critically ill, on CRRT. Vent support slightly weaned to 60% FiO2 & 10 PEEP. Some improvement in vasopressor requirements (off of epinephrine and phenylephrine) 5/21: Overnight weaned off of Giapreza, currently only on norepinephrine and vasopressin 5/22: No acute events overnight on minimal ventilator settings FiO2 30% PEEP 8.  Remains on levophed gtt to maintain map >65.   Will perform WUA  5/22: CT Head revealed No acute  intracranial findings. No        change from CT 07/01/2021. 5/24: Transitioned off CRRT 5/25: Neuro exam remains poor off sedation, plan for MRI Brain.  New fever, obtain UA/TA/RUQ [place] & Doppler LE, line holiday (central line, trialysis, and A-line all removed). Empiric Zosyn started.  Neurology and ID consulted 5/26: Neuro exam slightly improving.  Fever resolved.  Plan to place new temporary HD catheter per Nephrology request.    Objective   Blood pressure (!) 147/97, pulse (!) 110, temperature (!) 100.6 F (38.1 C), temperature source Axillary, resp. rate (!) 24, height _0  (1.905 m), weight (!) 139.4 kg, SpO2 95 %.    FiO2 (%):  [45 %] 45 %   Intake/Output Summary (Last 24 hours) at 07/28/2021 0955 Last data filed at 07/06/2021 1810 Gross per 24 hour  Intake 50 ml  Output 2900 ml  Net -2850 ml    Filed Weights   07/06/21 1500 07/06/21 1815 07/06/2021 0333  Weight: (!) 145.7 kg (!) 142.7 kg (!) 139.4 kg   Examination: General: Acutely ill-appearing obese male, laying in bed, NAD, on NO sedation, n HFNC HENT: Atraumatic, normocephalic, neck supple, difficult to assess JVD due to body habitus Lungs: Coarse breath sounds throughout, even, non labored, synchronous with vent  Cardiovascular: Tachycardia, Regular rhythm,  no murmurs, rubs, gallops, 1+ generalized edema  Abdomen: +BS x4, obese, soft, non distended  Extremities: Normal bulk and tone Neuro: Off sedation, opens eyes and can mouth some words(attempting to move UE but extremely weak), PERRL, scleredema  GU: Indwelling foley catheter draining yellow urine   Imaging     Assessment & Plan:   Acute hypoxic & hypercapnic respiratory failure in the setting of suspected aspiration in the setting of severe DTs P  --Venous US Bilateral LE negative for DVT x2  Septic shock~RESOLVED Elevated troponin suspect secondary to demand ischemia -Continuous cardiac  monitoring -Maintain MAP 60-65 -Vasopressors as needed to maintain MAP goal -D/c Midodrine 5/25 -HS Troponin peaked at 67 -Echocardiogram 06/23/21: LVEF 60-65%, Grade I DD, normal RV systolic function  Severe Sepsis due to Suspected aspiration pneumonia~TREATED CT head 5/16 unable to exclude left otitis media and chronic bilateral maxillary sinusitis Patient continues to have fevers today  Temp (24hrs), Avg:100.8 F (38.2 C), Min:99.7 F (37.6 C), Max:101.8 F (38.8 C)  Lab Results  Component Value Date   WBC 9.3 07/17/2021   -Monitor fever curve -Trend WBC's & Procalcitonin -Follow cultures as above -Completed course of Unasyn  - ID following, continue empiric Zosyn for now as per ID -Line holiday given on 5/25  Hypotonic hyponatremia,  suspect secondary to dehydration & poor p.o. intake~RESOLVED Acute kidney injury secondary to ATN with severe metabolic acidosis  Lactic acidosis persistent likely due to hepatic impairment Lab Results  Component Value Date   CREATININE 7.81 (H) 07/23/2021   BUN 90 (H) 07/22/2021   NA 136 07/12/2021   K 4.7 07/06/2021   CL 100 07/24/2021   CO2 21 (L) 07/20/2021   -Trend BMP, lactic acid, and ABG -Creatinine remains elevated but K and bicarb are normalized on HD -Persistent lactic acid elevation likely due to hepatic dysfunction, will not follow daily -Replace electrolytes as indicated  -Monitor UOP -Nephrology following, appreciate input: Transitioned off CRRT to intermittent HD 5/24  Transaminitis secondary to hepatic steatosis & ETOH abuse & Shock liver Lab Results  Component Value Date   AST 233 (H) 07/05/2021   AST 427 (H) 07/02/2021   AST 486 (H) 06/29/2021   -  Trend LFTs, falling  Thrombocytopenia, suspect secondary to alcohol abuse Lab Results  Component Value Date   WBC 9.3 07/30/2021   HGB 11.5 (L) 07/09/2021   HCT 34.6 (L) 07/25/2021   MCV 115.7 (H) 08/01/2021   PLT 163 07/16/2021   -Trend CBC -Monitor for  s/sx of bleeding and transfuse for Hgb <7 -Transfuse platelets for platelet count less than 50 with active bleeding  Incidental finding of left adrenal nodule via CT Abd/Pelvis  -Will need abdominal MRI for further evaluation in the outpatient setting   Acute metabolic encephalopathy in the setting of severe DTs and severe hyponatremia Sedation needs in the setting of mechanical ventilation --Neurology consulted, appreciate input -EEG negative for seizures -Perhaps some following of commands and purposeful movement, which is improvement   Morbid obesity with sarcopenia Protein malnutrition -Calorie excess, protein deficiency in the setting of alcoholism -Dietitian consulted for initiation of TF's  Best Practice (right click and "Reselect all SmartList Selections" daily)  Diet/type: TF's DVT prophylaxis: Subq heparin  GI prophylaxis: PPI Lines: N/A Foley:  N/A Code Status: DNR  Last date of multidisciplinary goals of care discussion [06/30/21]  Update pts wife 5/26 at bedside.  All questions answered to her satisfaction.  Labs   CBC: Recent Labs  Lab 07/03/21 0445 07/04/21 0430 07/05/21 0409 07/06/21 0312 08/03/2021 0443  WBC 11.0* 9.1 8.6 8.9 9.3  HGB 12.1* 12.0* 11.5* 11.8* 11.5*  HCT 35.5* 35.1* 34.0* 36.1* 34.6*  MCV 111.6* 111.1* 112.6* 114.6* 115.7*  PLT 89* 101* 123* 150 163     Basic Metabolic Panel: Recent Labs  Lab 07/03/21 0445 07/04/21 0430 07/05/21 0409 07/06/21 0312 07/09/2021 0443  NA 141 138 135 135 136  K 4.3 3.9 3.4* 4.3 4.7  CL 105 101 96* 98 100  CO2 18* 19* 20* 21* 21*  GLUCOSE 125* 129* 144* 119* 117*  BUN 132* 124* 108* 92* 90*  CREATININE 11.12* 9.49* 8.53* 8.11* 7.81*  CALCIUM 7.9* 7.8* 7.7* 7.7* 7.4*  MG 3.4* 3.0* 2.6* 2.5* 2.4  PHOS 7.1* 6.8* 7.7* 8.3* 8.6*    GFR: Estimated Creatinine Clearance: 17.4 mL/min (A) (by C-G formula based on SCr of 7.81 mg/dL (H)). Recent Labs  Lab 07/04/21 0430 07/05/21 0409 07/06/21 0312  07/31/2021 0443  WBC 9.1 8.6 8.9 9.3     Liver Function Tests: Recent Labs  Lab 07/02/21 1150 07/04/21 0430 07/05/21 0409 07/05/21 2127 07/06/21 0312 07/13/2021 0443  AST 427*  --   --  233*  --   --   ALT 247*  --   --  178*  --   --   ALKPHOS 125  --   --  98  --   --   BILITOT 4.6*  --   --  4.1*  --   --   PROT 5.5*  --   --  6.2*  --   --   ALBUMIN 1.8* 2.2* 2.1* 2.3* 2.4* 2.4*    Recent Labs  Lab 07/03/21 0445  LIPASE 227*     No results for input(s): AMMONIA in the last 168 hours.   ABG    Component Value Date/Time   PHART 7.45 07/06/2021 1900   PCO2ART 32 07/06/2021 1900   PO2ART 148 (H) 07/06/2021 1900   HCO3 22.2 07/06/2021 1900   ACIDBASEDEF 1.0 07/06/2021 1900   O2SAT 100 07/06/2021 1900      Coagulation Profile: No results for input(s): INR, PROTIME in the last 168 hours.   Cardiac Enzymes: Recent  Labs  Lab 06/30/21 1956  CKTOTAL 347     HbA1C: No results found for: HGBA1C  CBG: Recent Labs  Lab 07/06/21 1533 07/06/21 1914 07/06/21 2304 07/29/2021 0305 07/09/2021 0731  GLUCAP 107* 102* 128* 112* 111*     Review of Systems:   Unable to assess due to intubation/sedation/critical illness  Allergies Allergies  Allergen Reactions   Sulfa Antibiotics      Home Medications  Prior to Admission medications   Medication Sig Start Date End Date Taking? Authorizing Provider  atorvastatin (LIPITOR) 20 MG tablet Take 20 mg by mouth daily. 02/03/21  Yes [provider]  cyanocobalamin 1000 MCG tablet Take by mouth.   Yes [provider]  hydrochlorothiazide (HYDRODIURIL) 25 MG tablet Take 25 mg by mouth daily. 05/30/21  Yes [provider]  lisinopril (ZESTRIL) 40 MG tablet Take 40 mg by mouth daily. 05/04/21  Yes [provider]  rOPINIRole (REQUIP) 0.25 MG tablet Take 0.25 mg by mouth 3 (three) times daily. 07/28/20  Yes [provider]  vitamin C (ASCORBIC ACID) 500 MG tablet Take 500 mg by  mouth daily.   Yes [provider]  zolpidem (AMBIEN) 10 MG tablet Take 10 mg by mouth at bedtime as needed. 06/07/21  Yes [provider]     Scheduled Meds:  artificial tears   Both Eyes Q8H   chlorhexidine gluconate (MEDLINE KIT)  15 mL Mouth Rinse BID   Chlorhexidine Gluconate Cloth  6 each Topical Y0737   folic acid  1 mg Intravenous Daily   heparin injection (subcutaneous)  5,000 Units Subcutaneous Q12H   lactulose  20 g Per Tube TID   mouth rinse  15 mL Mouth Rinse q12n4p   multivitamin  1 tablet Per Tube QHS   pantoprazole (PROTONIX) IV  40 mg Intravenous Daily   rifaximin  550 mg Per Tube BID   sodium chloride HYPERTONIC  4 mL Nebulization Daily   thiamine injection  100 mg Intravenous Q24H   Continuous Infusions:  sodium chloride Stopped (06/29/21 2223)   fluconazole (DIFLUCAN) IV 200 mg (07/06/21 1841)   PRN Meds:.acetaminophen **OR** acetaminophen, albuterol, alteplase, heparin, heparin, labetalol, lidocaine (PF), lidocaine-prilocaine, morphine injection, ondansetron **OR** ondansetron (ZOFRAN) IV   Critical care provider statement:   Total critical care time: 33 minutes   Performed by: Lanney Gins MD   Critical care time was exclusive of separately billable procedures and treating other patients.   Critical care was necessary to treat or prevent imminent or life-threatening deterioration.   Critical care was time spent personally by me on the following activities: development of treatment plan with patient and/or surrogate as well as nursing, discussions with consultants, evaluation of patient's response to treatment, examination of patient, obtaining history from patient or surrogate, ordering and performing treatments and interventions, ordering and review of laboratory studies, ordering and review of radiographic studies, pulse oximetry and re-evaluation of patient's condition.    Ottie Glazier, M.D.  Pulmonary & Critical Care Medicine

## 2021-07-07 NOTE — Consult Note (Addendum)
[place] for Electrolyte Monitoring and Replacement   Recent Labs: Potassium (mmol/L)  Date Value  07/06/2021 4.7   Magnesium (mg/dL)  Date Value  07/12/2021 2.4   Calcium (mg/dL)  Date Value  07/06/2021 7.4 (L)   Albumin (g/dL)  Date Value  07/18/2021 2.4 (L)   Phosphorus (mg/dL)  Date Value  07/15/2021 8.6 (H)   Sodium (mmol/L)  Date Value  08/01/2021 136   Corrected Ca 9.2 mg/dL  Assessment: Patient is a 51 y/o M with medical history including obesity, chronic back pain, EtOH use disorder who presented to the ED 5/16 with weakness in setting of abdominal cramps, nausea, vomiting, and diarrhea. Patient subsequently admitted for severe symptomatic hyponatremia. Patient decompensated on 5/18 with acute onset respiratory failure requiring intubation. Patient remains admitted to the ICU where he is intubated and on mechanical ventilation.  Admission further complicated by acute renal failure necessitating initiation of CRRT on 5/19 and subsequently transitioned to HD. Pharmacy consulted to assist with electrolyte monitoring and replacement as indicated.  Goal of Therapy:  Electrolytes within normal limits  Plan:  --no electrolyte supplementation required --Follow-up electrolytes with AM labs tomorrow  [name] 07/15/2021 6:59 AM

## 2021-07-07 NOTE — Progress Notes (Signed)
Pharmacy Antibiotic Note  Robert Coffey is a 51 y.o. male admitted on 06/14/2021 with sepsis.  Pharmacy has been consulted for Cefepime and Vancomycin dosing.  Plan:   Height: 6\' 3"  (190.5 cm) Weight: (!) 139.4 kg (307 lb 5.1 oz) IBW/kg (Calculated) : 84.5  Temp (24hrs), Avg:100.7 F (38.2 C), Min:98.7 F (37.1 C), Max:102.2 F (39 C)  Recent Labs  Lab 07/03/21 0445 07/04/21 0430 07/05/21 0409 07/06/21 0312 07/14/2021 0443 07/06/2021 2002  WBC 11.0* 9.1 8.6 8.9 9.3 9.4  CREATININE 11.12* 9.49* 8.53* 8.11* 7.81*  --     Estimated Creatinine Clearance: 17.4 mL/min (A) (by C-G formula based on SCr of 7.81 mg/dL (H)).    Allergies  Allergen Reactions   Sulfa Antibiotics     Antimicrobials this admission: *** *** >> *** *** *** >> ***  Dose adjustments this admission: ***  Microbiology results: *** BCx: *** *** UCx: ***  *** Sputum: ***  *** MRSA PCR: ***  Thank you for allowing pharmacy to be a part of this patient's care.  Renda Rolls, PharmD, [facility] 07/11/2021 10:25 PM

## 2021-07-07 NOTE — TOC Progression Note (Signed)
Transition of Care [facility]) - Progression Note    Patient Details  Name: Robert Coffey MRN: [id number] Date of Birth: 04-27-1970  [facility] CM/SW Contact  Shelbie Hutching, RN Phone Number: 07/21/2021, 12:49 PM  Clinical Narrative:    Patient was extubated yesterday currently tolerating HFNC at 6L.  Palliative is following and had discussion with wife at the bedside.  Plan for continued aggressive care.  Patient needs a perm cath for ongoing dialysis.  He cant work with PT until his femoral catheter is removed.  TOC will follow for PT and OT recommendations.   Expected Discharge Plan:  (TBD) Barriers to Discharge: Continued Medical Work up  Expected Discharge Plan and Services Expected Discharge Plan:  (TBD)       Living arrangements for the past 2 months: Single Family Home                 DME Arranged: N/A DME Agency: NA       HH Arranged: NA HH Agency: NA         Social Determinants of Health (SDOH) Interventions    Readmission Risk Interventions     View : No data to display.

## 2021-07-07 NOTE — Progress Notes (Signed)
[facility]  PROGRESS NOTE   Subjective:   Resting quietly.  Activity is limited due to femoral catheter Foley and rectal tube in place Generalized edema  Objective:  Vital signs: Blood pressure (!) 157/98, pulse (!) 110, temperature 99.8 F (37.7 C), resp. rate (!) 23, height _0  (1.905 m), weight (!) 139.4 kg, SpO2 96 %.  Intake/Output Summary (Last 24 hours) at 08/03/2021 1132 Last data filed at 07/06/2021 1810 Gross per 24 hour  Intake 50 ml  Output 2900 ml  Net -2850 ml    Filed Weights   07/06/21 1500 07/06/21 1815 07/06/2021 0333  Weight: (!) 145.7 kg (!) 142.7 kg (!) 139.4 kg     Physical Exam: General: Critically ill  Head:  Normocephalic, atraumatic.  Eyes: conjunctival edema  Lungs:  basilar rhonchi  Heart: Irregular, tachy  Abdomen:  Firm, nontender  Extremities:  3+ peripheral edema.  Neurologic:  Resting quietly today  Skin:  warm  Access: Left femoral trialysis catheter placed on 6/96/2952    Basic Metabolic Panel: Recent Labs  Lab 07/03/21 0445 07/04/21 0430 07/05/21 0409 07/06/21 0312 07/13/2021 0443  NA 141 138 135 135 136  K 4.3 3.9 3.4* 4.3 4.7  CL 105 101 96* 98 100  CO2 18* 19* 20* 21* 21*  GLUCOSE 125* 129* 144* 119* 117*  BUN 132* 124* 108* 92* 90*  CREATININE 11.12* 9.49* 8.53* 8.11* 7.81*  CALCIUM 7.9* 7.8* 7.7* 7.7* 7.4*  MG 3.4* 3.0* 2.6* 2.5* 2.4  PHOS 7.1* 6.8* 7.7* 8.3* 8.6*     CBC: Recent Labs  Lab 07/03/21 0445 07/04/21 0430 07/05/21 0409 07/06/21 0312 07/12/2021 0443  WBC 11.0* 9.1 8.6 8.9 9.3  HGB 12.1* 12.0* 11.5* 11.8* 11.5*  HCT 35.5* 35.1* 34.0* 36.1* 34.6*  MCV 111.6* 111.1* 112.6* 114.6* 115.7*  PLT 89* 101* 123* 150 163      Urinalysis: No results for input(s): COLORURINE, LABSPEC, PHURINE, GLUCOSEU, HGBUR, BILIRUBINUR, KETONESUR, PROTEINUR, UROBILINOGEN, NITRITE, LEUKOCYTESUR in the last 72 hours.  Invalid input(s): APPERANCEUR     Imaging: DG Chest Port 1 View  Result Date:  07/06/2021 CLINICAL DATA:  Pulmonary edema. EXAM: PORTABLE CHEST 1 VIEW COMPARISON:  Jun 30, 2021. FINDINGS: The heart size and mediastinal contours are within normal limits. Right lung is clear. Stable left basilar subsegmental atelectasis is noted. The visualized skeletal structures are unremarkable. IMPRESSION: Stable left basilar subsegmental atelectasis. Electronically Signed   By: Marijo Conception M.D.   On: 07/06/2021 09:04     Medications:    sodium chloride Stopped (06/29/21 2223)   fluconazole (DIFLUCAN) IV 200 mg (07/06/21 1841)    artificial tears   Both Eyes Q8H   chlorhexidine gluconate (MEDLINE KIT)  15 mL Mouth Rinse BID   Chlorhexidine Gluconate Cloth  6 each Topical [id number]   folic acid  1 mg Intravenous Daily   heparin injection (subcutaneous)  5,000 Units Subcutaneous Q12H   mouth rinse  15 mL Mouth Rinse q12n4p   multivitamin  1 tablet Per Tube QHS   pantoprazole (PROTONIX) IV  40 mg Intravenous Daily   sodium chloride HYPERTONIC  4 mL Nebulization Daily   thiamine injection  100 mg Intravenous Q24H    Assessment/ Plan:     Principal Problem:   Acute metabolic encephalopathy Active Problems:   Acute kidney injury ([place])   Hyponatremia   Alcohol withdrawal ([facility])   Transaminitis   51 y.o.  male with past medical history including alcohol abuse, chronic back pain, and  peripheral neuropathy, who was admitted to [facility] on 06/15/2021 for Hyponatremia, Elevated liver function tests. Generalized weakness, AKI (acute kidney injury) and Acute metabolic encephalopathy.    #1: Acute kidney injury due to ETOH abuse, poor nutrition and severe illness.  Required CRRT during this admission.   Patient receiving hemodialysis due to uremia and lack of urine output.    He has tolerated iHD so far. 5.5 L fluid has been removed  Plan for permcath placement today or Monday based on vascular schedule. I - HD on Saturday; may remove temp cath tomorrow in anticipation of permcath early next  week.   #2: Hypotension/shock: Remains off pressors at this time.   #3: Generalized edema-volume removal with hemodialysis as tolerated.  Patient is currently anuric. Iv albumin during dialysis for oncotic support.   Can try lasix once starts to have UOP   #4: Acute respiratory failure with possible pneumonia: Intubated with antibiotic therapy.  ID following.  completed Zosyn. Now getting Fluconazole  #5: Transaminitis secondary to liver failure/shock liver: Most likely secondary to EtOH abuse, critical illness and hypotension.  Supportive care   LOS: [facility] 6/2/202311:32 AM

## 2021-07-08 DIAGNOSIS — Z978 Presence of other specified devices: Secondary | ICD-10-CM

## 2021-07-08 DIAGNOSIS — D696 Thrombocytopenia, unspecified: Secondary | ICD-10-CM

## 2021-07-08 DIAGNOSIS — A419 Sepsis, unspecified organism: Secondary | ICD-10-CM

## 2021-07-08 DIAGNOSIS — N179 Acute kidney failure, unspecified: Secondary | ICD-10-CM

## 2021-07-08 LAB — CULTURE, BLOOD (ROUTINE X 2): Special Requests: ADEQUATE

## 2021-07-08 MED ORDER — ATROPINE SULFATE 1 MG/10ML IJ SOSY
PREFILLED_SYRINGE | INTRAMUSCULAR | Status: AC
  Filled 2021-07-08: qty 10

## 2021-07-08 MED ORDER — SODIUM CHLORIDE 0.9 % IV SOLN
2.0000 g | INTRAVENOUS | Status: DC
  Filled 2021-07-08: qty 12.5

## 2021-07-08 MED ORDER — VANCOMYCIN HCL IN DEXTROSE 1-5 GM/200ML-% IV SOLN
1000.0000 mg | INTRAVENOUS | Status: DC
Start: 2021-07-08 — End: 2021-07-08
  Filled 2021-07-08: qty 200

## 2021-07-10 LAB — GLUCOSE, CAPILLARY: Glucose-Capillary: 136 mg/dL — ABNORMAL HIGH (ref 70–99)

## 2021-08-05 NOTE — Progress Notes (Signed)
Wasted 1 mg of morphine after 1 mg given. Unable to waste med D/T patient passing. Skeet Latch witnessed waste, but we were unable to find her in the cosign list. Cristie Hem form pharmacy consulted about the best way to chart the waste.

## 2021-08-05 NOTE — Death Summary Note (Signed)
DEATH SUMMARY   Patient Details  Name: Robert Coffey MRN: [id number] DOB: [birth year]  Admission/Discharge Information   Admit Date:  07/16/2021  Date of Death: Date of Death: 08/03/21  Time of Death: Time of Death: 0232  Length of Stay: 05-19-2022  Referring Physician: Renee Rival, NP   Reason(s) for Hospitalization  Weakness and altered mental status due to acute hypotonic hyponatremia  Diagnoses  Preliminary cause of death: Respiratory failure from aspiration Secondary Diagnoses (including complications and co-morbidities):  Principal Problem:   Acute metabolic encephalopathy Active Problems:   Acute kidney injury ([place])   Hyponatremia   Alcohol withdrawal ([place])   Transaminitis   Encounter for continuous renal replacement therapy (CRRT) for acute renal failure ([place])   Endotracheally intubated   Severe sepsis with septic shock ([place])   Thrombocytopenia [facility])   [place] [facility] Course (including significant findings, care, treatment, and services provided and events leading to death)  [name] is a 51 y.o. year old male with a past medical history significant for alcohol abuse, obesity, chronic back pain, peripheral neuropathy who presented to [facility] ED on 2021/07/16 due to altered mental status, poor p.o. intake, progressive weakness with difficulty getting around at home. Per the patient's wife, the patient has not been feeling well with poor p.o. intake for approximately a month, however has continued to drink alcohol daily (usually 3-4 drinks of liquor) during that time.  Over the past several days he has become progressively weak with difficulty getting around at home (not even in bed able to get out of bed), intermittent confusion, and diffuse abdominal cramping/nausea/vomiting/diarrhea.  Denies any history of falls, chest pain, shortness of breath, fever, cough.  Last known alcoholic drink was the day prior to admission on 2022/05/18. He was admitted by the hospitalist for further  work-up and treatment of acute metabolic encephalopathy in the setting of DTs and severe hyponatremia.  2022/07/17: Admitted by hospitalist for acute metabolic encephalopathy in setting of severe hyponatremia and DTs.  Requiring hypertonic saline 5/18: Developed acute hypoxic respiratory failure, possible concern for aspiration.  Required intubation and mechanical ventilation. 5/19: Pt with worsening acute renal failure with severe metabolic and lactic acidosis requiring CRRT.  Severely hypotensive requiring epinephrine, levophed, and vasopressin gtts to maintain map >65  5/20: Remains critically ill, on CRRT. Vent support slightly weaned to 60% FiO2 & 10 PEEP. Some improvement in vasopressor requirements (off of epinephrine and phenylephrine) 5/21: Overnight weaned off of Gi[place]re[place], currently only on norepinephrine and vasopressin 5/22: No acute events overnight on minimal ventilator settings FiO2 30% PEEP 8.  Remains on levophed gtt to maintain map >65.  Will perform WUA  5/22: CT Head revealed No acute intracranial findings. No        change from CT 07/16/21. 5/24: Transitioned off CRRT 5/25: Neuro exam remains poor off sedation, plan for MRI Brain.  New fever, obtain UA/TA/RUQ [place] & Doppler LE, line holiday (central line, trialysis, and A-line all removed). Empiric Zosyn started.  Neurology and ID consulted 5/26: Neuro exam slightly improving.  Fever resolved.  Plan to place new temporary HD catheter per Nephrology request. 07/03/21- patient mentation improved, he is s/p HD today. Renal function worse overnight.  07/04/21- patient is clinically improved following verbal communication.  For HD today.  PPneumonia is improved.  Patient did not pass SBT today post dialysis.  07/05/21- patient on last day of IV zosyn. Renal function slightly improved. Plan for SBT with liberation from MV today.  Mentation is improved and lucid.  07/06/21- patient extubated with BIPAP overnight, has had few episodes of  desaturation transiently and is physically very deconditioned. Repeat ABG tonight ordered.  07/11/2021- patient refused NG tube, he still requires RRT and vascular consult placed for per CATH placement. PT/OT in process post removal of fem Trialysis. Palliative consult for goals of care since patient declines most of the care offered to him. patient had medical set back with episode of hemoptysis and hypoxemia. He is weak and unable to protect airway.  He required numerous therapeutic aspirations returning large clotted debris from hypopharynx with dark phlegm. He is at high risk of death. I have called his wife Robert Coffey and communicated most recent events. Patient remains DNR. Overnight, patient suddenly dropped his HR into the 30's. 1 mg of atropine given and wife notified of change. HR continued to drop, pulse loss and the patient passed away at 02:32 with ICU staff present.    Pertinent Labs and Studies  Significant Diagnostic Studies CT ABDOMEN PELVIS WO CONTRAST  Result Date: 07/31/2021 CLINICAL DATA:  Intra-abdominal infection/peritonitis. EXAM: CT ABDOMEN AND PELVIS WITHOUT CONTRAST TECHNIQUE: Multidetector CT imaging of the abdomen and pelvis was performed following the standard protocol without IV contrast. RADIATION DOSE REDUCTION: This exam was performed according to the departmental dose-optimization program which includes automated exposure control, adjustment of the mA and/or kV according to patient size and/or use of iterative reconstruction technique. COMPARISON:  CT 2 weeks ago 06/15/2021 FINDINGS: Lower chest: Elevation of right hemidiaphragm. Suspected right lower lobe collapse, possible debris in the right lower lobe bronchus. Medial left lung base atelectasis. Hepatobiliary: The liver is enlarged spanning 22.5 cm cranial caudal. Subjective hepatic steatosis. No evidence of focal liver abnormality on this unenhanced exam, further limited by arms down positioning leading to streak artifact.  Possible layering sludge or noncalcified stones in the gallbladder. No pericholecystic fat stranding. No biliary dilatation. Pancreas: Question of mild edema about the pancreas versus motion. No pancreatic ductal dilatation. Spleen: Grossly negative, upper spleen obscured by motion. Adrenals/Urinary Tract: 2.6 cm left adrenal nodule with Hounsfield units of 40. Fall right adrenal gland. No hydronephrosis. Punctate nonobstructing stone in the lower right kidney. Faint symmetric bilateral perinephric edema. The urinary bladder is partially distended, thick walled. Stomach/Bowel: Detailed bowel assessment is limited in the absence of contrast and patient motion. There may be a small hiatal hernia. The stomach is otherwise decompressed. No small bowel obstruction. No evidence of small bowel inflammation. Normal appendix. Majority of the colon is decompressed. Occasional distal descending and sigmoid diverticulosis without focal diverticulitis. Rectal tube is present. Vascular/Lymphatic: Left femoral catheter tip in the external iliac vein. Aortic atherosclerosis without aneurysm. No portal venous or mesenteric gas. Scattered periportal lymph nodes not enlarged by size criteria. Reproductive: No mass. Other: Trace ascites in the pelvis and pericolic gutters. No free air or focal fluid collection. Subcutaneous edema in the body wall/flank. Small fat containing left inguinal hernia, tiny fat containing umbilical hernia. Musculoskeletal: Lower lumbar facet hypertrophy. There are no acute or suspicious osseous abnormalities. IMPRESSION: 1. Question of mild edema about the pancreas versus motion, possible mild pancreatitis. Recommend correlation with pancreatic enzymes. 2. Lung bases demonstrate suspected right lower lobe collapse, possible debris in the right lower lobe bronchus. Recommend correlation for aspiration risk factors. 3. Trace ascites in the pelvis and pericolic gutters. Subcutaneous edema in the body wall/flank.  4. Punctate nonobstructing stone in the lower right kidney. 5. Hepatomegaly and hepatic steatosis. 6. Left adrenal nodule measuring 2.6 cm is indeterminate. This  is unchanged from recent exam. Recommend adrenal washout CT or chemical shift MRI. JACR 2017 Aug; 14(8):1038-44, JCAT 2016 Mar-Apr; 40(2):194-200, Urol J 2006 Spring; 3(2):71-4. 7. Colonic diverticulosis without diverticulitis. Aortic Atherosclerosis (ICD10-I70.0). Electronically Signed   By: Keith Rake M.D.   On: 07/14/2021 21:16   DG Chest 1 View  Result Date: 06/23/2021 CLINICAL DATA:  52 year old male with weakness. Metabolic encephalopathy. Intubated. EXAM: CHEST  1 VIEW COMPARISON:  Portable chest 06/22/2021 and earlier. FINDINGS: Portable AP supine view at 0643 hours. Stable ET tube, left IJ central line and enteric tube which courses to the abdomen with tip not included. Left chest pacer or resuscitation pad now in place. Normal cardiac size and mediastinal contours. Left lung markings appear stable. No pneumothorax or pleural effusion on this supine view. But there is new multifocal confluent peribronchial opacity in the right lung, most pronounced at the level of the hilum. No pulmonary edema. Paucity of bowel gas. No acute osseous abnormality identified. IMPRESSION: 1. Stable lines and tubes. 2. New peribronchial opacity in the right lung is nonspecific but suspicious for Pneumonia. Electronically Signed   By: Genevie Ann M.D.   On: 06/23/2021 06:54   DG Abd 1 View  Result Date: 06/22/2021 CLINICAL DATA:  Evaluate OG tube placement. EXAM: ABDOMEN - 1 VIEW COMPARISON:  Chest radiograph 06/22/2021 FINDINGS: Orogastric tube extends into the abdomen. The tip is in the distal gastric body region. Lower lungs are clear. Small amount of bowel gas in the abdomen. IMPRESSION: Orogastric tube in the distal gastric body region. Electronically Signed   By: Markus Daft M.D.   On: 06/22/2021 11:22   CT HEAD WO CONTRAST (5MM)  Result Date:  07/28/2021 CLINICAL DATA:  Intra-abdominal infection/peritonitis. EXAM: CT HEAD WITHOUT CONTRAST TECHNIQUE: Contiguous axial images were obtained from the base of the skull through the vertex without intravenous contrast. RADIATION DOSE REDUCTION: This exam was performed according to the departmental dose-optimization program which includes automated exposure control, adjustment of the mA and/or kV according to patient size and/or use of iterative reconstruction technique. COMPARISON:  Jun 26, 2021 FINDINGS: Brain: No evidence of acute infarction, hemorrhage, hydrocephalus, extra-axial collection or mass lesion/mass effect. Vascular: No hyperdense vessel or unexpected calcification. Skull: Normal. Negative for fracture or focal lesion. Sinuses/Orbits: There are small bilateral maxillary sinus air-fluid levels. Multiple, stable bilateral maxillary sinus polyps versus mucous retention cysts are also seen. There is moderate to marked severity bilateral ethmoid sinus and sphenoid sinus mucosal thickening with marked severity thickening of the nasal mucosa. Other: None. IMPRESSION: 1. No acute intracranial abnormality. 2. Moderate to marked severity pansinus disease. Electronically Signed   By: Virgina Norfolk M.D.   On: 07/21/2021 21:29   CT HEAD WO CONTRAST (5MM)  Result Date: 06/26/2021 CLINICAL DATA:  Mental status change.  Altered mental status. EXAM: CT HEAD WITHOUT CONTRAST TECHNIQUE: Contiguous axial images were obtained from the base of the skull through the vertex without intravenous contrast. RADIATION DOSE REDUCTION: This exam was performed according to the departmental dose-optimization program which includes automated exposure control, adjustment of the mA and/or kV according to patient size and/or use of iterative reconstruction technique. COMPARISON:  Head CT 07/01/2021 FINDINGS: Brain: No acute intracranial hemorrhage. No focal mass lesion. No CT evidence of acute infarction. No midline shift or  mass effect. No hydrocephalus. Basilar cisterns are patent. Vascular: No hyperdense vessel or unexpected calcification. Skull: Normal. Negative for fracture or focal lesion. Sinuses/Orbits: Mucosal thickening in the maxillary sinuses. Orbits are normal. Other:  None. IMPRESSION: 1. No acute intracranial findings. 2. No change from CT 06/06/2021. Electronically Signed   By: Suzy Bouchard M.D.   On: 06/26/2021 12:12   CT HEAD WO CONTRAST (5MM)  Result Date: 06/24/2021 CLINICAL DATA:  Altered mental status. Weakness for 2 weeks. Urinary incontinence. Confusion. EXAM: CT HEAD WITHOUT CONTRAST TECHNIQUE: Contiguous axial images were obtained from the base of the skull through the vertex without intravenous contrast. RADIATION DOSE REDUCTION: This exam was performed according to the departmental dose-optimization program which includes automated exposure control, adjustment of the mA and/or kV according to patient size and/or use of iterative reconstruction technique. COMPARISON:  None Available. FINDINGS: Brain: Advanced for age cerebral atrophy especially involving the frontal lobes. Otherwise, the brainstem, cerebellum, cerebral peduncles, thalamus, basal ganglia, basilar cisterns, and ventricular system appear within normal limits. No intracranial hemorrhage, mass lesion, or acute CVA. Vascular: There is atherosclerotic calcification of the cavernous carotid arteries bilaterally. Skull: Unremarkable Sinuses/Orbits: Chronic bilateral maxillary sinusitis. Small bilateral mastoid effusions. Fluid or debris in the left sinus tympani on images 16-17 of series 2. Other: No supplemental non-categorized findings. IMPRESSION: 1. Fluid or debris in the left sinus tympani/middle ear, cannot exclude otitis media. There small bilateral mastoid effusions and chronic bilateral maxillary sinusitis. 2. Advanced for age cerebral atrophy. 3. Atherosclerosis. Electronically Signed   By: Van Clines M.D.   On: 07/01/2021  09:01   MR BRAIN WO CONTRAST  Result Date: 06/29/2021 CLINICAL DATA:  Mental status change, unknown cause. Acute metabolic encephalopathy. EXAM: MRI HEAD WITHOUT CONTRAST TECHNIQUE: Multiplanar, multiecho pulse sequences of the brain and surrounding structures were obtained without intravenous contrast. COMPARISON:  Head CT 06/26/2021 FINDINGS: Brain: There is no evidence of an acute infarct, intracranial hemorrhage, mass, midline shift, or extra-axial fluid collection. There is mild cerebral atrophy. The brain is normal in signal. Vascular: Major intracranial vascular flow voids are preserved. Skull and upper cervical spine: Heterogeneously diminished bone marrow T1 signal intensity in the skull base and included upper cervical spine, nonspecific though can be seen with anemia, smoking, obesity, and other chronic systemic disease. Sinuses/Orbits: Unremarkable orbits. Moderate volume fluid in the sphenoid sinuses. Polypoid mucosal thickening or small mucous retention cysts in the maxillary sinuses. Bilateral mastoid and middle ear effusions. Other: None. IMPRESSION: 1. No acute intracranial abnormality. 2. Mild cerebral atrophy. Electronically Signed   By: Logan Bores M.D.   On: 06/29/2021 14:33   CT ABDOMEN PELVIS W CONTRAST  Result Date: 06/11/2021 CLINICAL DATA:  Acute generalized abdominal pain. EXAM: CT ABDOMEN AND PELVIS WITH CONTRAST TECHNIQUE: Multidetector CT imaging of the abdomen and pelvis was performed using the standard protocol following bolus administration of intravenous contrast. RADIATION DOSE REDUCTION: This exam was performed according to the departmental dose-optimization program which includes automated exposure control, adjustment of the mA and/or kV according to patient size and/or use of iterative reconstruction technique. CONTRAST:  19mL OMNIPAQUE IOHEXOL 300 MG/ML  SOLN COMPARISON:  None Available. FINDINGS: Lower chest: No acute abnormality. Hepatobiliary: No gallstones or  biliary dilatation is noted. Hepatic steatosis is noted. Pancreas: Unremarkable. No pancreatic ductal dilatation or surrounding inflammatory changes. Spleen: Normal in size without focal abnormality. Adrenals/Urinary Tract: 2.6 cm enhancing left adrenal nodule is noted. Right adrenal gland appears normal. No hydronephrosis or renal obstruction is noted. No renal or ureteral calculi are noted. Urinary bladder is unremarkable. Stomach/Bowel: Stomach is within normal limits. Appendix appears normal. No evidence of bowel wall thickening, distention, or inflammatory changes. Vascular/Lymphatic: Aortic atherosclerosis. No enlarged abdominal  or pelvic lymph nodes. Reproductive: Prostate is unremarkable. Other: No abdominal wall hernia or abnormality. No abdominopelvic ascites. Musculoskeletal: No acute or significant osseous findings. IMPRESSION: Hepatic steatosis. 2.6 cm enhancing left adrenal nodule is noted. When the patient is clinically stable and able to follow directions and hold their breath (preferably as an outpatient) further evaluation with dedicated abdominal MRI should be considered. Aortic Atherosclerosis (ICD10-I70.0). Electronically Signed   By: Marijo Conception M.D.   On: 06/13/2021 09:01   US Venous Img Lower Bilateral (DVT)  Result Date: 06/29/2021 CLINICAL DATA:  Bilateral lower extremity edema.  Evaluate for DVT. EXAM: BILATERAL LOWER EXTREMITY VENOUS DOPPLER ULTRASOUND TECHNIQUE: Gray-scale sonography with graded compression, as well as color Doppler and duplex ultrasound were performed to evaluate the lower extremity deep venous systems from the level of the common femoral vein and including the common femoral, femoral, profunda femoral, popliteal and calf veins including the posterior tibial, peroneal and gastrocnemius veins when visible. The superficial great saphenous vein was also interrogated. Spectral Doppler was utilized to evaluate flow at rest and with distal augmentation maneuvers in  the common femoral, femoral and popliteal veins. COMPARISON:  Bilateral lower extremity venous Doppler ultrasound-06/23/2021 (negative) FINDINGS: RIGHT LOWER EXTREMITY Common Femoral Vein: No evidence of thrombus. Normal compressibility, respiratory phasicity and response to augmentation. Saphenofemoral Junction: No evidence of thrombus. Normal compressibility and flow on color Doppler imaging. Profunda Femoral Vein: No evidence of thrombus. Normal compressibility and flow on color Doppler imaging. Femoral Vein: No evidence of thrombus. Normal compressibility, respiratory phasicity and response to augmentation. Popliteal Vein: No evidence of thrombus. Normal compressibility, respiratory phasicity and response to augmentation. Calf Veins: No evidence of thrombus. Normal compressibility and flow on color Doppler imaging. Superficial Great Saphenous Vein: No evidence of thrombus. Normal compressibility. Other Findings:  None. LEFT LOWER EXTREMITY Common Femoral Vein: No evidence of thrombus. Normal compressibility, respiratory phasicity and response to augmentation. Saphenofemoral Junction: No evidence of thrombus. Normal compressibility and flow on color Doppler imaging. Profunda Femoral Vein: No evidence of thrombus. Normal compressibility and flow on color Doppler imaging. Femoral Vein: No evidence of thrombus. Normal compressibility, respiratory phasicity and response to augmentation. Popliteal Vein: No evidence of thrombus. Normal compressibility, respiratory phasicity and response to augmentation. Calf Veins: No evidence of thrombus. Normal compressibility and flow on color Doppler imaging. Superficial Great Saphenous Vein: No evidence of thrombus. Normal compressibility. Other Findings:  None. IMPRESSION: No evidence of DVT within either lower extremity. Electronically Signed   By: Sandi Mariscal M.D.   On: 06/29/2021 16:48   US Venous Img Lower Bilateral (DVT)  Result Date: 06/23/2021 CLINICAL DATA:  Swelling  EXAM: BILATERAL LOWER EXTREMITY VENOUS DOPPLER ULTRASOUND TECHNIQUE: Gray-scale sonography with compression, as well as color and duplex ultrasound, were performed to evaluate the deep venous system(s) from the level of the common femoral vein through the popliteal and proximal calf veins. COMPARISON:  None Available. FINDINGS: VENOUS Normal compressibility of the common femoral, superficial femoral, and popliteal veins, as well as the visualized calf veins. Visualized portions of profunda femoral vein and great saphenous vein unremarkable. No filling defects to suggest DVT on grayscale or color Doppler imaging. Doppler waveforms show normal direction of venous flow, normal respiratory plasticity and response to augmentation. OTHER None. Limitations: none IMPRESSION: No evidence of lower extremity DVT. Electronically Signed   By: Macy Mis M.D.   On: 06/23/2021 21:45   DG Chest Port 1 View  Result Date: 07/06/2021 CLINICAL DATA:  Pulmonary edema. EXAM: PORTABLE  CHEST 1 VIEW COMPARISON:  Jun 30, 2021. FINDINGS: The heart size and mediastinal contours are within normal limits. Right lung is clear. Stable left basilar subsegmental atelectasis is noted. The visualized skeletal structures are unremarkable. IMPRESSION: Stable left basilar subsegmental atelectasis. Electronically Signed   By: Marijo Conception M.D.   On: 07/06/2021 09:04   DG Chest Port 1 View  Result Date: 06/30/2021 CLINICAL DATA:  [id number].  Acute respiratory failure EXAM: PORTABLE CHEST 1 VIEW COMPARISON:  06/28/2021 FINDINGS: Endotracheal tube is seen 3.4 cm above the carina. Esophageal Doppler probe is seen within the distal esophagus. Nasogastric tube extends into the upper abdomen beyond the margin of the examination. Lung volumes are small and pulmonary insufflation is stable since prior examination. Left basilar atelectasis again seen. Right internal jugular hemodialysis catheter and left internal jugular central venous catheter have been  removed. Cardiac size within normal limits. Pulmonary vascularity is normal. IMPRESSION: Interval removal of central venous catheters.  Stable support tubes. Stable pulmonary hypoinflation with left basilar atelectasis. Electronically Signed   By: Fidela Salisbury M.D.   On: 06/30/2021 02:28   DG Chest Port 1 View  Result Date: 06/28/2021 CLINICAL DATA:  Acute respiratory failure with hypoxia. EXAM: PORTABLE CHEST 1 VIEW COMPARISON:  Jun 25, 2021. FINDINGS: The heart size and mediastinal contours are within normal limits. Endotracheal nasogastric tubes are unchanged in position. Bilateral internal jugular catheters are unchanged. Stable bilateral lung opacities are noted concerning for pneumonia. The visualized skeletal structures are unremarkable. IMPRESSION: Stable support apparatus. Stable bilateral lung opacities as described above. Electronically Signed   By: Marijo Conception M.D.   On: 06/28/2021 07:51   DG Chest Port 1 View  Result Date: 06/25/2021 CLINICAL DATA:  Hypoxic respiratory failure. EXAM: PORTABLE CHEST 1 VIEW COMPARISON:  06/23/2021. FINDINGS: 4:56 a.m., 06/25/2021.  ETT tip is 3.7 cm from the carina. Enteric tube enters the stomach with intragastric course not evaluated. Bilateral IJ catheters are stable in positioning, the left with tip at the azygous confluence, the right with tip in the distal SVC. There is mild cardiomegaly, normal caliber of the central vessels with decreased vascular prominence in the interval. No pleural effusion is seen. There is increased opacity in the right suprahilar and left infrahilar areas today concerning for pneumonia. The remaining lungs clear. In all other respects no further changes. IMPRESSION: 1. Increased opacity in the right suprahilar and left infrahilar areas concerning for pneumonia. 2. Central vessels are normal in caliber, previously were engorged. 3. Tubes and lines as above. Electronically Signed   By: Telford Nab M.D.   On: 06/25/2021 07:04    DG Chest Port 1 View  Result Date: 06/23/2021 CLINICAL DATA:  Central line placement. EXAM: PORTABLE CHEST 1 VIEW COMPARISON:  Same day. FINDINGS: Interval placement of right internal jugular catheter with distal tip in expected position of the SVC. No pneumothorax is noted. Otherwise stable support apparatus. IMPRESSION: Interval placement of right internal jugular catheter with distal tip in expected position of the SVC. Electronically Signed   By: Marijo Conception M.D.   On: 06/23/2021 10:00   DG Chest Port 1 View  Result Date: 06/22/2021 CLINICAL DATA:  Intubated. EXAM: PORTABLE CHEST 1 VIEW COMPARISON:  Chest x-ray earlier, same date. FINDINGS: The endotracheal tube is 4 cm above the carina. The NG tube is coursing down the esophagus and into the stomach. The left IJ central venous catheter tip is in the mid SVC. The cardiac silhouette, mediastinal and hilar  contours are within normal limits. The lungs are clear. IMPRESSION: 1. Support apparatus in good position without complicating features. 2. No acute cardiopulmonary findings. Electronically Signed   By: Marijo Sanes M.D.   On: 06/22/2021 11:23   DG Chest Port 1 View  Result Date: 06/22/2021 CLINICAL DATA:  Dyspnea. EXAM: PORTABLE CHEST 1 VIEW COMPARISON:  None. FINDINGS: The heart size and mediastinal contours are within normal limits. Both lungs are clear. Hypoinflation of the lungs is noted. The visualized skeletal structures are unremarkable. IMPRESSION: No active disease.  Hypoinflation of the lungs. Electronically Signed   By: Marijo Conception M.D.   On: 06/22/2021 09:57   EEG adult  Result Date: 06/30/2021 Derek Jack, MD     06/30/2021 10:03 AM Routine EEG Report Kurk Corniel is a 51 y.o. male with a history of delirium tremens who is undergoing an EEG to evaluate for seizures. Report: This EEG was acquired with electrodes placed according to the International 10-20 electrode system (including Fp1, Fp2, F3, F4, C3, C4, P3, P4,  O1, O2, T3, T4, T5, T6, A1, A2, Fz, Cz, Pz). The following electrodes were missing or displaced: none. The background was composed of low amplitude continuous slowing at 3-5 Hz. This activity is reactive to stimulation. There was no sleep architecture identified. There was no focal slowing. There were no interictal epileptiform discharges. There were no electrographic seizures identified. Photic stimulation and hyperventilation were not performed. Impression and clinical correlation: This EEG was obtained while comatose and is abnormal due to severe diffuse slowing consistent with global cerebral dysfunction. Epileptiform abnormalities were not seen during this recording. Su Monks, MD [facility] [phone] If 7pm- 7am, please page neurology on call as listed in [place].   ECHOCARDIOGRAM COMPLETE  Result Date: 06/23/2021    ECHOCARDIOGRAM REPORT   Patient Name:   DEVYN SHEERIN Date of Exam: 06/23/2021 Medical Rec #:  [id number]      Height:       75.0 in Accession #:    [id number]     Weight:       338.6 lb Date of Birth:  1970/10/07       BSA:          2.745 m Patient Age:    26 years       BP:           90/63 mmHg Patient Gender: M              HR:           111 bpm. Exam Location:  [facility] Procedure: 2D Echo, Limited Color Doppler, Cardiac Doppler and Intracardiac            Opacification Agent Indications:     Shock  History:         Patient has no prior history of Echocardiogram examinations.                  Signs/Symptoms:Shortness of Breath. Hx of alcohol use.  Sonographer:     Charmayne Sheer Referring Phys:  [id number] Bradly Bienenstock Diagnosing Phys: Ida Rogue MD  Sonographer Comments: Patient is morbidly obese, echo performed with patient supine and on artificial respirator and Technically challenging study due to limited acoustic windows. IMPRESSIONS  1. Left ventricular ejection fraction, by estimation, is 60 to 65%. The left ventricle has normal function. The left ventricle has no  regional wall motion abnormalities. Left ventricular diastolic parameters are consistent with Grade I diastolic dysfunction (impaired relaxation).  2. Right ventricular systolic function is normal. The right ventricular size is normal.  3. The mitral valve was not well visualized. No evidence of mitral valve regurgitation. No evidence of mitral stenosis.  4. The aortic valve was not well visualized. Aortic valve regurgitation is not visualized. No aortic stenosis is present. FINDINGS  Left Ventricle: Left ventricular ejection fraction, by estimation, is 60 to 65%. The left ventricle has normal function. The left ventricle has no regional wall motion abnormalities. Definity contrast agent was given IV to delineate the left ventricular  endocardial borders. The left ventricular internal cavity size was normal in size. There is no left ventricular hypertrophy. Left ventricular diastolic parameters are consistent with Grade I diastolic dysfunction (impaired relaxation). Right Ventricle: The right ventricular size is normal. No increase in right ventricular wall thickness. Right ventricular systolic function is normal. Left Atrium: Left atrial size was normal in size. Right Atrium: Right atrial size was normal in size. Pericardium: There is no evidence of pericardial effusion. Mitral Valve: The mitral valve was not well visualized. No evidence of mitral valve regurgitation. No evidence of mitral valve stenosis. MV peak gradient, 3.8 mmHg. The mean mitral valve gradient is 2.0 mmHg. Tricuspid Valve: The tricuspid valve is not well visualized. Tricuspid valve regurgitation is not demonstrated. No evidence of tricuspid stenosis. Aortic Valve: The aortic valve was not well visualized. Aortic valve regurgitation is not visualized. No aortic stenosis is present. Aortic valve mean gradient measures 7.0 mmHg. Aortic valve peak gradient measures 14.1 mmHg. Aortic valve area, by VTI measures 1.28 cm. Pulmonic Valve: The pulmonic  valve was not well visualized. Pulmonic valve regurgitation is not visualized. No evidence of pulmonic stenosis. Aorta: The aortic root is normal in size and structure. Venous: The inferior vena cava was not well visualized. IAS/Shunts: No atrial level shunt detected by color flow Doppler.  LEFT VENTRICLE PLAX 2D LVIDd:         5.80 cm   Diastology LVIDs:         4.22 cm   LV e' medial:    6.64 cm/s LV PW:         0.97 cm   LV E/e' medial:  10.6 LV IVS:        0.82 cm   LV e' lateral:   14.60 cm/s LVOT diam:     2.30 cm   LV E/e' lateral: 4.8 LV SV:         36 LV SV Index:   13 LVOT Area:     4.15 cm  AORTIC VALVE AV Area (Vmax):    1.64 cm AV Area (Vmean):   1.72 cm AV Area (VTI):     1.28 cm AV Vmax:           188.00 cm/s AV Vmean:          123.000 cm/s AV VTI:            0.279 m AV Peak Grad:      14.1 mmHg AV Mean Grad:      7.0 mmHg LVOT Vmax:         74.40 cm/s LVOT Vmean:        51.000 cm/s LVOT VTI:          0.086 m LVOT/AV VTI ratio: 0.31  AORTA Ao Root diam: 3.20 cm MITRAL VALVE MV Area (PHT): 5.13 cm    SHUNTS MV Area VTI:   1.85 cm    Systemic VTI:  0.09 m MV Peak  grad:  3.8 mmHg    Systemic Diam: 2.30 cm MV Mean grad:  2.0 mmHg MV Vmax:       0.98 m/s MV Vmean:      64.3 cm/s MV Decel Time: 148 msec MV E velocity: 70.70 cm/s MV A velocity: 83.60 cm/s MV E/A ratio:  0.85 Ida Rogue MD Electronically signed by Ida Rogue MD Signature Date/Time: 06/23/2021/12:13:04 PM    Final    US Abdomen Limited RUQ (LIVER/GB)  Result Date: 06/30/2021 CLINICAL DATA:  Fever EXAM: ULTRASOUND ABDOMEN LIMITED RIGHT UPPER QUADRANT COMPARISON:  None Available. FINDINGS: Gallbladder: No gallstones or wall thickening visualized. No sonographic Murphy sign noted by sonographer. Common bile duct: Not visualized. Liver: No focal lesion identified. Increased parenchymal echogenicity. Portal vein is patent on color Doppler imaging with normal direction of blood flow towards the liver. Other: None. IMPRESSION: 1.  Limited exam due to patient body habitus. Within limitations, normal appearance of the gallbladder. 2. Hepatic steatosis. 3. Common bile duct not visualized. Electronically Signed   By: Yetta Glassman M.D.   On: 06/30/2021 09:03    Microbiology Recent Results (from the past 240 hour(s))  Culture, Respiratory w Gram Stain     Status: None   Collection Time: 06/29/21  9:48 AM   Specimen: Tracheal Aspirate; Respiratory  Result Value Ref Range Status   Specimen Description   Final    TRACHEAL ASPIRATE Performed at [facility], [address], [place], [place] [address]    Special Requests   Final    NONE Performed at [facility], [address] [place], [place] [address]    Gram Stain   Final    MODERATE SQUAMOUS EPITHELIAL CELLS PRESENT FEW WBC PRESENT, PREDOMINANTLY PMN RARE YEAST WITH PSEUDOHYPHAE RARE GRAM NEGATIVE RODS    Culture   Final    FEW CANDIDA DUBLINIENSIS NO STAPHYLOCOCCUS AUREUS ISOLATED No Pseudomonas species isolated Performed at [facility], [place] [address], [place], [place] [address]    Report Status 07/02/2021 FINAL  Final  MRSA Next Gen by PCR, Nasal     Status: None   Collection Time: 06/29/21  9:24 PM   Specimen: Nasal Mucosa; Nasal Swab  Result Value Ref Range Status   MRSA by PCR Next Gen NOT DETECTED NOT DETECTED Final    Comment: (NOTE) The GeneXpert MRSA Assay (FDA approved for NASAL specimens only), is one component of a comprehensive MRSA colonization surveillance program. It is not intended to diagnose MRSA infection nor to guide or monitor treatment for MRSA infections. Test performance is not FDA approved in patients less than 49 years old. Performed at [facility], [address] [place], [place] [address]     Lab Basic Metabolic Panel: Recent Labs  Lab 07/03/21 0445 07/04/21 0430 07/05/21 0409 07/06/21 0312 07/18/2021 0443  NA 141 138 135 135 136  K 4.3 3.9 3.4* 4.3 4.7  CL 105 101 96* 98  100  CO2 18* 19* 20* 21* 21*  GLUCOSE 125* 129* 144* 119* 117*  BUN 132* 124* 108* 92* 90*  CREATININE 11.12* 9.49* 8.53* 8.11* 7.81*  CALCIUM 7.9* 7.8* 7.7* 7.7* 7.4*  MG 3.4* 3.0* 2.6* 2.5* 2.4  PHOS 7.1* 6.8* 7.7* 8.3* 8.6*   Liver Function Tests: Recent Labs  Lab 07/02/21 1150 07/04/21 0430 07/05/21 0409 07/05/21 2127 07/06/21 0312 07/11/2021 0443  AST 427*  --   --  233*  --   --   ALT 247*  --   --  178*  --   --   ALKPHOS 125  --   --  98  --   --   BILITOT 4.6*  --   --  4.1*  --   --   PROT 5.5*  --   --  6.2*  --   --   ALBUMIN 1.8* 2.2* 2.1* 2.3* 2.4* 2.4*   Recent Labs  Lab 07/03/21 0445  LIPASE 227*   No results for input(s): AMMONIA in the last 168 hours. CBC: Recent Labs  Lab 07/04/21 0430 07/05/21 0409 07/06/21 0312 07/17/2021 0443 07/19/2021 2002  WBC 9.1 8.6 8.9 9.3 9.4  HGB 12.0* 11.5* 11.8* 11.5* 11.3*  HCT 35.1* 34.0* 36.1* 34.6* 35.0*  MCV 111.1* 112.6* 114.6* 115.7* 117.4*  PLT 101* 123* 150 163 167   Cardiac Enzymes: No results for input(s): CKTOTAL, CKMB, CKMBINDEX, TROPONINI in the last 168 hours. Sepsis Labs: Recent Labs  Lab 07/05/21 0409 07/06/21 0312 07/29/2021 0443 08/04/2021 2002  WBC 8.6 8.9 9.3 9.4    Procedures/Operations  06/22/21: ETT placement 06/22/21: CVC placement 06/22/21: Arterial catheter placement 06/23/21: HD catheter placement 06/30/21: EEG performed 06/30/21: HD catheter placement 07/05/21: Extubation performed   Toribio Harbour L Rust-Chester 07-21-21, 3:09 AM  Domingo Pulse Rust[place], AGACNP-BC Acute Care Nurse Practitioner [facility]   [phone] / [phone] Please see Amion for pager details.

## 2021-08-05 DEATH — deceased
